# Patient Record
Sex: Female | Born: 1980 | Race: White | Hispanic: No | Smoking: Current some day smoker
Health system: Southern US, Community
[De-identification: ages and names within clinical notes are randomized; demographics above are authoritative.]

## PROBLEM LIST (undated history)

## (undated) DIAGNOSIS — M25569 Pain in unspecified knee: Secondary | ICD-10-CM

## (undated) DIAGNOSIS — F419 Anxiety disorder, unspecified: Secondary | ICD-10-CM

## (undated) DIAGNOSIS — F988 Other specified behavioral and emotional disorders with onset usually occurring in childhood and adolescence: Secondary | ICD-10-CM

## (undated) DIAGNOSIS — M79673 Pain in unspecified foot: Secondary | ICD-10-CM

## (undated) DIAGNOSIS — R519 Headache, unspecified: Secondary | ICD-10-CM

## (undated) DIAGNOSIS — F329 Major depressive disorder, single episode, unspecified: Secondary | ICD-10-CM

## (undated) DIAGNOSIS — K644 Residual hemorrhoidal skin tags: Secondary | ICD-10-CM

## (undated) DIAGNOSIS — K219 Gastro-esophageal reflux disease without esophagitis: Secondary | ICD-10-CM

## (undated) DIAGNOSIS — R600 Localized edema: Secondary | ICD-10-CM

## (undated) DIAGNOSIS — M2550 Pain in unspecified joint: Secondary | ICD-10-CM

## (undated) DIAGNOSIS — F32A Depression, unspecified: Secondary | ICD-10-CM

## (undated) DIAGNOSIS — G4730 Sleep apnea, unspecified: Secondary | ICD-10-CM

## (undated) DIAGNOSIS — Z8619 Personal history of other infectious and parasitic diseases: Secondary | ICD-10-CM

## (undated) HISTORY — DX: Depression, unspecified: F32.A

## (undated) HISTORY — DX: Major depressive disorder, single episode, unspecified: F32.9

## (undated) HISTORY — DX: Pain in unspecified knee: M25.569

## (undated) HISTORY — DX: Anxiety disorder, unspecified: F41.9

## (undated) HISTORY — DX: Residual hemorrhoidal skin tags: K64.4

## (undated) HISTORY — DX: Personal history of other infectious and parasitic diseases: Z86.19

## (undated) HISTORY — DX: Gastro-esophageal reflux disease without esophagitis: K21.9

## (undated) HISTORY — PX: MOLE REMOVAL: SHX2046

## (undated) HISTORY — DX: Pain in unspecified foot: M79.673

## (undated) HISTORY — DX: Pain in unspecified joint: M25.50

## (undated) HISTORY — DX: Localized edema: R60.0

## (undated) HISTORY — DX: Sleep apnea, unspecified: G47.30

## (undated) HISTORY — DX: Headache, unspecified: R51.9

---
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
## 2002-10-23 ENCOUNTER — Other Ambulatory Visit: Admission: RE | Admit: 2002-10-23 | Discharge: 2002-10-23 | Payer: Self-pay | Admitting: Obstetrics and Gynecology

## 2002-10-23 ENCOUNTER — Other Ambulatory Visit: Admission: RE | Admit: 2002-10-23 | Discharge: 2002-10-23 | Payer: Self-pay | Admitting: Obstetrics & Gynecology

## 2004-04-08 ENCOUNTER — Emergency Department (HOSPITAL_COMMUNITY): Admission: EM | Admit: 2004-04-08 | Discharge: 2004-04-08 | Payer: Self-pay | Admitting: Emergency Medicine

## 2004-08-20 ENCOUNTER — Ambulatory Visit: Payer: Self-pay | Admitting: Family Medicine

## 2004-08-27 ENCOUNTER — Ambulatory Visit: Payer: Self-pay

## 2004-09-08 ENCOUNTER — Ambulatory Visit: Payer: Self-pay | Admitting: *Deleted

## 2004-09-10 ENCOUNTER — Ambulatory Visit: Payer: Self-pay | Admitting: Family Medicine

## 2004-10-29 ENCOUNTER — Ambulatory Visit: Payer: Self-pay | Admitting: Family Medicine

## 2004-11-04 ENCOUNTER — Ambulatory Visit: Payer: Self-pay | Admitting: Family Medicine

## 2005-01-04 ENCOUNTER — Ambulatory Visit (HOSPITAL_COMMUNITY): Admission: RE | Admit: 2005-01-04 | Discharge: 2005-01-04 | Payer: Self-pay | Admitting: Surgery

## 2005-01-17 ENCOUNTER — Ambulatory Visit (HOSPITAL_BASED_OUTPATIENT_CLINIC_OR_DEPARTMENT_OTHER): Admission: RE | Admit: 2005-01-17 | Discharge: 2005-01-17 | Payer: Self-pay | Admitting: Surgery

## 2005-01-24 ENCOUNTER — Ambulatory Visit: Payer: Self-pay | Admitting: Internal Medicine

## 2005-02-01 ENCOUNTER — Ambulatory Visit (HOSPITAL_COMMUNITY): Admission: RE | Admit: 2005-02-01 | Discharge: 2005-02-01 | Payer: Self-pay | Admitting: Surgery

## 2005-02-01 ENCOUNTER — Encounter: Admission: RE | Admit: 2005-02-01 | Discharge: 2005-02-01 | Payer: Self-pay | Admitting: Surgery

## 2005-02-19 ENCOUNTER — Ambulatory Visit: Payer: Self-pay | Admitting: Internal Medicine

## 2005-03-12 ENCOUNTER — Encounter: Admission: RE | Admit: 2005-03-12 | Discharge: 2005-06-10 | Payer: Self-pay | Admitting: Surgery

## 2005-03-25 ENCOUNTER — Emergency Department (HOSPITAL_COMMUNITY): Admission: EM | Admit: 2005-03-25 | Discharge: 2005-03-25 | Payer: Self-pay | Admitting: Emergency Medicine

## 2005-07-29 ENCOUNTER — Encounter: Admission: RE | Admit: 2005-07-29 | Discharge: 2005-07-29 | Payer: Self-pay | Admitting: Surgery

## 2005-09-09 ENCOUNTER — Encounter: Admission: RE | Admit: 2005-09-09 | Discharge: 2005-09-09 | Payer: Self-pay | Admitting: Surgery

## 2005-09-27 ENCOUNTER — Encounter: Admission: RE | Admit: 2005-09-27 | Discharge: 2005-09-27 | Payer: Self-pay | Admitting: Occupational Medicine

## 2005-10-27 ENCOUNTER — Encounter: Admission: RE | Admit: 2005-10-27 | Discharge: 2006-01-25 | Payer: Self-pay | Admitting: Surgery

## 2005-11-09 ENCOUNTER — Ambulatory Visit (HOSPITAL_COMMUNITY): Admission: RE | Admit: 2005-11-09 | Discharge: 2005-11-10 | Payer: Self-pay | Admitting: Surgery

## 2005-11-09 HISTORY — PX: LAPAROSCOPIC GASTRIC BANDING: SHX1100

## 2006-01-17 ENCOUNTER — Encounter: Admission: RE | Admit: 2006-01-17 | Discharge: 2006-04-17 | Payer: Self-pay | Admitting: Surgery

## 2006-02-17 ENCOUNTER — Ambulatory Visit: Payer: Self-pay | Admitting: Family Medicine

## 2006-02-17 LAB — CONVERTED CEMR LAB
ALT: 14 units/L (ref 0–40)
Alkaline Phosphatase: 46 units/L (ref 39–117)
BUN: 8 mg/dL (ref 6–23)
Calcium: 8.9 mg/dL (ref 8.4–10.5)
Chloride: 108 meq/L (ref 96–112)
Chol/HDL Ratio, serum: 2.8
Cholesterol: 86 mg/dL (ref 0–200)
Creatinine, Ser: 0.9 mg/dL (ref 0.4–1.2)
Eosinophil percent: 1.3 % (ref 0.0–5.0)
GFR calc non Af Amer: 82 mL/min
Glomerular Filtration Rate, Af Am: 100 mL/min/{1.73_m2}
Glucose, Bld: 94 mg/dL (ref 70–99)
HCT: 41.1 % (ref 36.0–46.0)
Hemoglobin: 13.5 g/dL (ref 12.0–15.0)
Homocysteine: 8.7 micromoles/L (ref 5.00–13.90)
LDL Cholesterol: 40 mg/dL (ref 0–99)
MCHC: 32.8 g/dL (ref 30.0–36.0)
MCV: 89.1 fL (ref 78.0–100.0)
Sodium: 137 meq/L (ref 135–145)
Total Bilirubin: 0.5 mg/dL (ref 0.3–1.2)
VLDL: 15 mg/dL (ref 0–40)

## 2006-04-13 ENCOUNTER — Emergency Department (HOSPITAL_COMMUNITY): Admission: EM | Admit: 2006-04-13 | Discharge: 2006-04-13 | Payer: Self-pay | Admitting: Family Medicine

## 2006-05-11 ENCOUNTER — Emergency Department (HOSPITAL_COMMUNITY): Admission: EM | Admit: 2006-05-11 | Discharge: 2006-05-11 | Payer: Self-pay | Admitting: Family Medicine

## 2006-05-17 ENCOUNTER — Ambulatory Visit: Payer: Self-pay | Admitting: Family Medicine

## 2006-05-20 ENCOUNTER — Emergency Department (HOSPITAL_COMMUNITY): Admission: EM | Admit: 2006-05-20 | Discharge: 2006-05-20 | Payer: Self-pay | Admitting: Emergency Medicine

## 2006-06-03 ENCOUNTER — Ambulatory Visit: Payer: Self-pay | Admitting: Family Medicine

## 2006-11-14 ENCOUNTER — Ambulatory Visit: Payer: Self-pay | Admitting: Family Medicine

## 2006-11-14 DIAGNOSIS — F325 Major depressive disorder, single episode, in full remission: Secondary | ICD-10-CM | POA: Insufficient documentation

## 2007-06-06 ENCOUNTER — Ambulatory Visit: Payer: Self-pay | Admitting: Family Medicine

## 2007-06-06 DIAGNOSIS — K219 Gastro-esophageal reflux disease without esophagitis: Secondary | ICD-10-CM | POA: Insufficient documentation

## 2007-06-06 DIAGNOSIS — D2320 Other benign neoplasm of skin of unspecified ear and external auricular canal: Secondary | ICD-10-CM | POA: Insufficient documentation

## 2007-06-06 LAB — CONVERTED CEMR LAB
Bilirubin Urine: NEGATIVE
Glucose, Urine, Semiquant: NEGATIVE
Ketones, urine, test strip: NEGATIVE
Nitrite: NEGATIVE
Protein, U semiquant: 30
Urobilinogen, UA: NEGATIVE
pH: 5

## 2007-06-07 ENCOUNTER — Encounter: Payer: Self-pay | Admitting: Family Medicine

## 2007-06-08 ENCOUNTER — Encounter (INDEPENDENT_AMBULATORY_CARE_PROVIDER_SITE_OTHER): Payer: Self-pay | Admitting: *Deleted

## 2007-06-11 ENCOUNTER — Encounter (INDEPENDENT_AMBULATORY_CARE_PROVIDER_SITE_OTHER): Payer: Self-pay | Admitting: *Deleted

## 2007-06-11 LAB — CONVERTED CEMR LAB
ALT: 10 units/L (ref 0–35)
AST: 13 units/L (ref 0–37)
Albumin: 3.7 g/dL (ref 3.5–5.2)
Alkaline Phosphatase: 58 units/L (ref 39–117)
Basophils Relative: 0.1 % (ref 0.0–1.0)
Bilirubin, Direct: 0.1 mg/dL (ref 0.0–0.3)
CO2: 29 meq/L (ref 19–32)
Calcium: 9.1 mg/dL (ref 8.4–10.5)
Chloride: 107 meq/L (ref 96–112)
Cholesterol: 85 mg/dL (ref 0–200)
Creatinine, Ser: 0.8 mg/dL (ref 0.4–1.2)
Folate: 4.5 ng/mL
Glucose, Bld: 93 mg/dL (ref 70–99)
HDL: 38.7 mg/dL — ABNORMAL LOW (ref >39.0)
MCHC: 32.1 g/dL (ref 30.0–36.0)
Monocytes Absolute: 0.6 10*3/uL (ref 0.1–1.0)
Neutrophils Relative %: 72.1 % (ref 43.0–77.0)
Platelets: 384 10*3/uL (ref 150–400)
RBC: 4.79 M/uL (ref 3.87–5.11)
RDW: 12.4 % (ref 11.5–14.6)
TSH: 0.75 microintl units/mL (ref 0.35–5.50)
Triglycerides: 47 mg/dL (ref 0–149)
VLDL: 9 mg/dL (ref 0–40)
Vitamin B-12: 586 pg/mL (ref 211–911)
WBC: 8.8 10*3/uL (ref 4.5–10.5)

## 2007-06-12 ENCOUNTER — Encounter (INDEPENDENT_AMBULATORY_CARE_PROVIDER_SITE_OTHER): Payer: Self-pay | Admitting: *Deleted

## 2007-06-27 ENCOUNTER — Telehealth (INDEPENDENT_AMBULATORY_CARE_PROVIDER_SITE_OTHER): Payer: Self-pay | Admitting: *Deleted

## 2007-10-15 ENCOUNTER — Inpatient Hospital Stay (HOSPITAL_COMMUNITY): Admission: AD | Admit: 2007-10-15 | Discharge: 2007-10-15 | Payer: Self-pay | Admitting: Obstetrics

## 2007-10-15 ENCOUNTER — Encounter (INDEPENDENT_AMBULATORY_CARE_PROVIDER_SITE_OTHER): Payer: Self-pay | Admitting: Obstetrics

## 2007-12-27 ENCOUNTER — Encounter (INDEPENDENT_AMBULATORY_CARE_PROVIDER_SITE_OTHER): Payer: Self-pay | Admitting: *Deleted

## 2007-12-27 ENCOUNTER — Ambulatory Visit: Payer: Self-pay | Admitting: Family Medicine

## 2007-12-27 DIAGNOSIS — IMO0002 Reserved for concepts with insufficient information to code with codable children: Secondary | ICD-10-CM | POA: Insufficient documentation

## 2008-01-08 ENCOUNTER — Emergency Department (HOSPITAL_COMMUNITY): Admission: EM | Admit: 2008-01-08 | Discharge: 2008-01-08 | Payer: Self-pay | Admitting: Family Medicine

## 2008-01-09 ENCOUNTER — Ambulatory Visit: Payer: Self-pay | Admitting: Family Medicine

## 2008-01-09 ENCOUNTER — Telehealth (INDEPENDENT_AMBULATORY_CARE_PROVIDER_SITE_OTHER): Payer: Self-pay | Admitting: *Deleted

## 2008-01-09 DIAGNOSIS — G500 Trigeminal neuralgia: Secondary | ICD-10-CM | POA: Insufficient documentation

## 2008-01-12 ENCOUNTER — Telehealth (INDEPENDENT_AMBULATORY_CARE_PROVIDER_SITE_OTHER): Payer: Self-pay | Admitting: *Deleted

## 2008-01-12 ENCOUNTER — Ambulatory Visit: Payer: Self-pay | Admitting: Family Medicine

## 2008-01-12 LAB — CONVERTED CEMR LAB
ALT: 14 units/L (ref 0–35)
AST: 14 units/L (ref 0–37)
Albumin: 3.6 g/dL (ref 3.5–5.2)
Alkaline Phosphatase: 42 units/L (ref 39–117)
BUN: 10 mg/dL (ref 6–23)
Basophils Absolute: 0.2 10*3/uL — ABNORMAL HIGH (ref 0.0–0.1)
Calcium: 9.2 mg/dL (ref 8.4–10.5)
Creatinine, Ser: 1 mg/dL (ref 0.4–1.2)
Folate: 8 ng/mL
GFR calc Af Amer: 87 mL/min
Glucose, Bld: 80 mg/dL (ref 70–99)
HCT: 40.1 % (ref 36.0–46.0)
MCV: 91.2 fL (ref 78.0–100.0)
Monocytes Relative: 2.2 % — ABNORMAL LOW (ref 3.0–12.0)
Neutro Abs: 4.2 10*3/uL (ref 1.4–7.7)
Platelets: 288 10*3/uL (ref 150–400)
RDW: 11.8 % (ref 11.5–14.6)
Total Bilirubin: 0.6 mg/dL (ref 0.3–1.2)
Total Protein: 6.7 g/dL (ref 6.0–8.3)
WBC: 6.9 10*3/uL (ref 4.5–10.5)

## 2008-01-23 ENCOUNTER — Telehealth (INDEPENDENT_AMBULATORY_CARE_PROVIDER_SITE_OTHER): Payer: Self-pay | Admitting: *Deleted

## 2008-04-12 ENCOUNTER — Telehealth: Payer: Self-pay | Admitting: Family Medicine

## 2008-05-10 ENCOUNTER — Telehealth: Payer: Self-pay | Admitting: Family Medicine

## 2008-10-31 ENCOUNTER — Ambulatory Visit: Payer: Self-pay | Admitting: Family Medicine

## 2008-11-05 ENCOUNTER — Encounter (INDEPENDENT_AMBULATORY_CARE_PROVIDER_SITE_OTHER): Payer: Self-pay | Admitting: *Deleted

## 2008-11-05 LAB — CONVERTED CEMR LAB
AST: 15 units/L (ref 0–37)
Alkaline Phosphatase: 58 units/L (ref 39–117)
Bilirubin, Direct: 0.2 mg/dL (ref 0.0–0.3)
MCHC: 33.6 g/dL (ref 30.0–36.0)
MCV: 92.1 fL (ref 78.0–100.0)
Monocytes Absolute: 0.3 10*3/uL (ref 0.1–1.0)
Neutrophils Relative %: 66.2 % (ref 43.0–77.0)
RBC: 4.83 M/uL (ref 3.87–5.11)
Total Protein: 7.3 g/dL (ref 6.0–8.3)

## 2008-11-06 ENCOUNTER — Telehealth (INDEPENDENT_AMBULATORY_CARE_PROVIDER_SITE_OTHER): Payer: Self-pay | Admitting: *Deleted

## 2008-11-06 LAB — CONVERTED CEMR LAB: GC Probe Amp, Urine: NEGATIVE

## 2008-11-20 ENCOUNTER — Encounter: Payer: Self-pay | Admitting: Family Medicine

## 2008-12-13 ENCOUNTER — Telehealth (INDEPENDENT_AMBULATORY_CARE_PROVIDER_SITE_OTHER): Payer: Self-pay | Admitting: *Deleted

## 2008-12-18 ENCOUNTER — Encounter: Payer: Self-pay | Admitting: Family Medicine

## 2009-01-15 ENCOUNTER — Telehealth (INDEPENDENT_AMBULATORY_CARE_PROVIDER_SITE_OTHER): Payer: Self-pay | Admitting: *Deleted

## 2009-09-27 ENCOUNTER — Inpatient Hospital Stay (HOSPITAL_COMMUNITY): Admission: AD | Admit: 2009-09-27 | Discharge: 2009-09-27 | Payer: Self-pay | Admitting: Obstetrics and Gynecology

## 2009-09-30 ENCOUNTER — Observation Stay (HOSPITAL_COMMUNITY): Admission: AD | Admit: 2009-09-30 | Discharge: 2009-09-30 | Payer: Self-pay | Admitting: Obstetrics and Gynecology

## 2009-10-08 ENCOUNTER — Inpatient Hospital Stay (HOSPITAL_COMMUNITY): Admission: AD | Admit: 2009-10-08 | Discharge: 2009-10-11 | Payer: Self-pay | Admitting: Obstetrics & Gynecology

## 2009-10-31 ENCOUNTER — Ambulatory Visit: Payer: Self-pay | Admitting: Family Medicine

## 2009-10-31 DIAGNOSIS — M549 Dorsalgia, unspecified: Secondary | ICD-10-CM | POA: Insufficient documentation

## 2010-03-31 NOTE — Assessment & Plan Note (Signed)
Summary: pulled muscle in back   Vital Signs:  Patient profile:   30 year old female Height:      67.25 inches Weight:      231 pounds BMI:     36.04 Pulse rate:   82 / minute BP sitting:   110 / 74  (left arm)  Vitals Entered By: Doristine Devoid CMA (October 31, 2009 2:13 PM) CC: pulled muscle in back upper back to L shoulder    History of Present Illness: 30 yo woman here today for ? pulled muscle in L shoulder.  sxs started last night while cleaning.  now worse this AM.  pain is located below L shoulder blade, 'it kinda spasms'.  pain will radiate across back to R side.  no weakness or numbness of arm.  has oxycodone and hydrocodone left over from pregnancy- no relief.  unable to lift 47 week old infant due to pain.  is breastfeeding.  Allergies (verified): 1)  ! 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 Lungs are clear to auscultation, no crackles or wheezes. Heart:  normal rate, regular rhythm, and no murmur.   Msk:  + TTP over L scapula, full ROM of L shoulder, + muscle spasm. Pulses:  +2 carotid, radial, ulnar Extremities:  no C/C/E Neurologic:  strength normal in all extremities, sensation intact to light touch, and DTRs symmetrical and normal.     Impression & Recommendations:  Problem # 1:  BACK PAIN (ICD-724.5) Assessment New most likely due to muscle spasm.  start scheduled NSAIDs.  no studies on breast feeding and muscle relaxers- advised pt that if she wants to take muscle relaxer she needs to pump and dump for next 6-8 hrs.  initially wasn't willing to prescribe but pt desparate for relief.  pt expressed understanding of impact on breastfeeding.  reviewed supportive care and red flags that should prompt return.  Pt expresses understanding and  is in agreement w/ this plan. Her updated medication list for this problem includes:    Ibuprofen 800 Mg Tabs (Ibuprofen) .Marland Kitchen... 1 tab by mouth three times a day (every 8 hrs) as needed for pain.  take w/ food.    Cyclobenzaprine Hcl 10 Mg Tabs (Cyclobenzaprine hcl) .Marland Kitchen... 1 by mouth 2 times daily as needed for back pain  Complete Medication List: 1)  Bmp, Lipid  .... V70.0 2)  Ibuprofen 800 Mg Tabs (Ibuprofen) .Marland Kitchen.. 1 tab by mouth three times a day (every 8 hrs) as needed for pain.  take w/ food. 3)  Cyclobenzaprine Hcl 10 Mg Tabs (Cyclobenzaprine hcl) .Marland Kitchen.. 1 by mouth 2 times daily as needed for back pain  Patient Instructions: 1)  This is a muscle spasm/strain 2)  Take the ibuprofen regularly for the next 5-7 days and then as needed.  take w/ food 3)  Add tylenol as needed- don't take tylenol if you take the vicodin (it has tylenol in it) 4)  If you take the muscle relaxer- pump and dump for  ~ 8 hrs 5)  Try and avoid lifting the car seat- but it is safe to carry her as long as you feel safe doing it 6)  Use heat as often as possible- heating pad, Thermacare wrap 7)  CONGRATS!  Hang in there!!! Prescriptions: CYCLOBENZAPRINE HCL 10 MG  TABS (CYCLOBENZAPRINE HCL) 1 by mouth 2 times daily as needed for back pain  #20 x 0   Entered and Authorized by:   Neena Rhymes MD   Signed by:   Neena Rhymes MD on 10/31/2009   Method used:   Electronically to        [facility] [phone]* (retail)       [address] [place], [place]  [address]       Ph: [phone] or [phone]       Fax: [phone]   RxID:   [id number] IBUPROFEN 800 MG TABS (IBUPROFEN) 1 tab by mouth three times a day (every 8 hrs) as needed for pain.  take w/ food.  #60 x 0   Entered and Authorized by:   Neena Rhymes MD   Signed by:   Neena Rhymes MD on 10/31/2009   Method used:   Electronically to        [facility] [phone]* (retail)       [address] [place], [place]  [address]        Ph: [phone] or [phone]       Fax: [phone]   RxID:   [id number]

## 2010-05-15 LAB — CBC
HCT: 32.2 % — ABNORMAL LOW (ref 36.0–46.0)
Hemoglobin: 10.8 g/dL — ABNORMAL LOW (ref 12.0–15.0)
MCHC: 33.4 g/dL (ref 30.0–36.0)
Platelets: 217 10*3/uL (ref 150–400)
RDW: 13.8 % (ref 11.5–15.5)
WBC: 14.4 10*3/uL — ABNORMAL HIGH (ref 4.0–10.5)

## 2010-05-16 LAB — CBC
HCT: 37.6 % (ref 36.0–46.0)
RBC: 4.28 MIL/uL (ref 3.87–5.11)
RDW: 13.7 % (ref 11.5–15.5)

## 2010-05-16 LAB — URINALYSIS, ROUTINE W REFLEX MICROSCOPIC
Glucose, UA: NEGATIVE mg/dL
Nitrite: NEGATIVE
Urobilinogen, UA: 0.2 mg/dL (ref 0.0–1.0)
pH: 6 (ref 5.0–8.0)

## 2010-06-25 ENCOUNTER — Encounter: Payer: Self-pay | Admitting: Family Medicine

## 2010-07-03 ENCOUNTER — Encounter: Payer: Self-pay | Admitting: Family Medicine

## 2010-07-10 ENCOUNTER — Ambulatory Visit (INDEPENDENT_AMBULATORY_CARE_PROVIDER_SITE_OTHER): Payer: 59 | Admitting: Family Medicine

## 2010-07-10 ENCOUNTER — Encounter: Payer: Self-pay | Admitting: *Deleted

## 2010-07-10 ENCOUNTER — Encounter: Payer: Self-pay | Admitting: Family Medicine

## 2010-07-10 VITALS — BP 110/68 | HR 76 | Temp 98.9°F | Resp 20 | Ht 67.25 in | Wt 251.0 lb

## 2010-07-10 DIAGNOSIS — J069 Acute upper respiratory infection, unspecified: Secondary | ICD-10-CM

## 2010-07-10 DIAGNOSIS — Z0000 Encounter for general adult medical examination without abnormal findings: Secondary | ICD-10-CM

## 2010-07-10 LAB — BASIC METABOLIC PANEL
CO2: 24 mEq/L (ref 19–32)
Calcium: 9.3 mg/dL (ref 8.4–10.5)
Chloride: 108 mEq/L (ref 96–112)
GFR: 64.1 mL/min (ref >60.00)
Glucose, Bld: 79 mg/dL (ref 70–99)

## 2010-07-10 LAB — HEPATIC FUNCTION PANEL
ALT: 16 U/L (ref 0–35)
AST: 15 U/L (ref 0–37)
Albumin: 3.7 g/dL (ref 3.5–5.2)
Alkaline Phosphatase: 57 U/L (ref 39–117)
Bilirubin, Direct: 0.1 mg/dL (ref 0.0–0.3)
Total Protein: 6.9 g/dL (ref 6.0–8.3)

## 2010-07-10 LAB — POCT URINALYSIS DIPSTICK
Bilirubin, UA: NEGATIVE
Glucose, UA: NEGATIVE
Ketones, UA: NEGATIVE
Leukocytes, UA: NEGATIVE
Nitrite, UA: NEGATIVE
Protein, UA: NEGATIVE
Urobilinogen, UA: 0.2

## 2010-07-10 LAB — CBC WITH DIFFERENTIAL/PLATELET
Eosinophils Relative: 1.6 % (ref 0.0–5.0)
Hemoglobin: 14.5 g/dL (ref 12.0–15.0)
MCV: 90.1 fl (ref 78.0–100.0)
Neutrophils Relative %: 68 % (ref 43.0–77.0)
Platelets: 282 10*3/uL (ref 150.0–400.0)
RBC: 4.74 Mil/uL (ref 3.87–5.11)
RDW: 12.8 % (ref 11.5–14.6)
WBC: 6.9 10*3/uL (ref 4.5–10.5)

## 2010-07-10 LAB — TSH: TSH: 1.8 u[IU]/mL (ref 0.35–5.50)

## 2010-07-10 LAB — LIPID PANEL
HDL: 41.1 mg/dL (ref >39.00)
LDL Cholesterol: 48 mg/dL (ref 0–99)
VLDL: 17.8 mg/dL (ref 0.0–40.0)

## 2010-07-10 MED ORDER — FLUTICASONE PROPIONATE 50 MCG/ACT NA SUSP: 2.0000 | Freq: Every day | NASAL | Status: DC

## 2010-07-10 NOTE — Patient Instructions (Signed)
Upper Respiratory Infection (URI), Adult An upper respiratory infection (URI) is also known as the common cold. It is often caused by a virus. Colds are easily spread (contagious). You can pass it to others by touch or by drinking out of the same glass. You may have:  A runny nose.  Sneezing.   Coughing.   A stuffy nose (nasal congestion).  A sinus infection.   A sore throat.  A scratchy voice (hoarseness).   You can also have:  Tiredness (fatigue).  Muscle aches.   A headache.  A mild fever.   Usually, you get well in a week or two. Your doctor will know if you have a URI by talking to you and examining you. HOME CARE  Inhale heated mist or steam (vaporizer or shower).   Sip chicken soup.   Get plenty of rest.   Use lozenges for throat comfort.   Rinse your mouth (gargle) with warm water or salt water (1/4 teaspoon salt in 8 ounces of water).   Only take medicine as told by your doctor.   Drink enough water and fluids to keep your pee (urine) clear or pale yellow.   Rest as needed.   Return to work when your temperature has returned to normal or as told by your doctor. Use a face mask and wash your hands to stop your cold from spreading.  GET HELP IF:  You have a temperature by mouth above 100.4.   After the first few days, you feel you are getting worse, not better.   You have questions about your medicine.  GET HELP RIGHT AWAY IF:  You have a temperature by mouth above 100.4, not controlled by medicine.   You have a bad or lasting headache, ear pain, sinus pain, or chest pain.   You have trouble breathing or get short of breath.   You have a lasting cough, cough up blood, or have a change in your usual mucus.   You have sore muscles, a stiff neck, or a very bad headache, not controlled with medicine.  MAKE SURE YOU:  Understand these instructions.   Will watch your condition.   Will get help right away if you are not doing well or get worse.    Document Released: 08/04/2007 Document Re-Released: 05/12/2009 [facility] Patient Information 2011 [place], [place].

## 2010-07-10 NOTE — Progress Notes (Signed)
  Subjective:     Marisa Humphrey is a 30 y.o. female and is here for a comprehensive physical exam. The patient reports + sinus symptoms for 2 days.  Using otc sinus meds.    History   Social History  . Marital Status: Married    Spouse Name: N/A    Number of Children: 1  . Years of Education: N/A   Occupational History  . nurse [employer]   Social History Main Topics  . Smoking status: Current Everyday Smoker -- 0.3 packs/day    Types: Cigarettes  . Smokeless tobacco: Never Used  . Alcohol Use: Yes  . Drug Use: No  . Sexually Active: Yes -- Female partner(s)   Other Topics Concern  . Not on file   Social History Narrative  . No narrative on file   Health Maintenance  Topic Date Due  . Pap Smear  04/16/2000  . Tetanus/tdap  09/11/2012    The following portions of the patient's history were reviewed and updated as appropriate: allergies, current medications, past family history, past medical history, past social history, past surgical history and problem list.  Review of Systems Review of Systems  Constitutional: Negative for activity change, appetite change and fatigue.  HENT: Negative for hearing loss, congestion, tinnitus and ear discharge.  dentist q48m Eyes: Negative for visual disturbance   Respiratory: Negative for cough, chest tightness and shortness of breath.   Cardiovascular: Negative for chest pain, palpitations and leg swelling.  Gastrointestinal: Negative for abdominal pain, diarrhea, constipation and abdominal distention.  Genitourinary: Negative for urgency, frequency, decreased urine volume and difficulty urinating.  Musculoskeletal: Negative for back pain, arthralgias and gait problem.  Skin: Negative for color change, pallor and rash.  Neurological: Negative for dizziness, light-headedness, numbness and headaches.  Hematological: Negative for adenopathy. Does not bruise/bleed easily.  Psychiatric/Behavioral: Negative for suicidal ideas, confusion,  sleep disturbance, self-injury, dysphoric mood, decreased concentration and agitation.  Exercise--no  Objective:    BP 110/68  Pulse 76  Temp(Src) 98.9 F (37.2 C) (Oral)  Resp 20  Ht 5' 7.25" (1.708 m)  Wt 251 lb (113.853 kg)  BMI 39.02 kg/m2 General appearance: alert, cooperative, appears stated age, no distress and morbidly obese Head: Normocephalic, without obvious abnormality, atraumatic Eyes: conjunctivae/corneas clear. PERRL, EOM's intact. Fundi benign. Ears: normal TM's and external ear canals both ears Nose: Nares normal. Septum midline. Mucosa normal. No drainage or sinus tenderness., mild congestion, sinus tenderness bilateral Throat: lips, mucosa, and tongue normal; teeth and gums normal Neck: no adenopathy, supple, symmetrical, trachea midline and thyroid not enlarged, symmetric, no tenderness/mass/nodules Lungs: clear to auscultation bilaterally Breasts: normal appearance, no masses or tenderness Heart: regular rate and rhythm, S1, S2 normal, no murmur, click, rub or gallop Abdomen: soft, non-tender; bowel sounds normal; no masses,  no organomegaly Pelvic: gyn Extremities: extremities normal, atraumatic, no cyanosis or edema Pulses: 2+ and symmetric Skin: Skin color, texture, turgor normal. No rashes or lesions Lymph nodes: Cervical, supraclavicular, and axillary nodes normal. Neurologic: Grossly normal    Assessment:    Healthy female exam.  URI Plan:    diet and exercise flonase for congestion con't otc sinus med Call if symptoms do not improve See After Visit Summary for Counseling Recommendations

## 2010-07-17 ENCOUNTER — Encounter: Payer: Self-pay | Admitting: Family Medicine

## 2010-07-17 NOTE — Procedures (Signed)
[name], Marisa Humphrey               ACCOUNT NO.:  [id number]   MEDICAL RECORD NO.:  [id number]          PATIENT TYPE:  OUT   LOCATION:  SLEEP CENTER                 FACILITY[facility]  [facility]   PHYSICIAN:  Clinton D. Maple Hudson, M.D. DATE OF BIRTH:  1980/09/22   DATE OF STUDY:  01/17/2005                              NOCTURNAL POLYSOMNOGRAM   REFERRING PHYSICIAN:  Dr. Luretha Murphy   DATE OF STUDY:  January 17, 2005   INDICATION FOR STUDY:  Hypersomnia with sleep apnea. Epworth sleepiness  score 19/24, BMI 48, weight 311 pounds.   SLEEP ARCHITECTURE:  Total sleep time 438 minutes with sleep efficiency 85%.  Stage I was 5%, stage II 44%, stages III and IV 17%, REM 34% of total sleep  time. Sleep latency 21 minutes, REM latency 88 minutes, awake after sleep  onset 55 minutes, arousal index 18. No bedtime medication taken.   RESPIRATORY DATA:  Split study protocol. Apnea hypopnea index (AHI, RDI)  28.7 obstructive events per hour indicating moderate obstructive sleep  apnea/hypopnea syndrome before CPAP. This included 35 obstructive apneas and  34 hypopneas before CPAP. Events were not positional. REM AHI 12.9. CPAP was  titrated to 15 CWP, AHI 0.9 per hour. A full face mask was tried but not  tolerated because of claustrophobia. She used a Respironics ComfortGel nasal  mask, size petite, with heated humidifier. She required a Breathe Right  strip to help with nasal congestion.   OXYGEN DATA:  Moderate snoring with oxygen desaturation to a nadir of 87%  before CPAP. After CPAP control saturation held 97-98% on room air.   CARDIAC DATA:  Normal sinus rhythm.   MOVEMENT/PARASOMNIA:  Occasional leg jerk with little effect on sleep.   IMPRESSION/RECOMMENDATION:  1.  Moderate obstructive sleep apnea/hypopnea syndrome, apnea-hypopnea index      28.7 per hour with moderate to loud snoring and oxygen desaturation to      87%. Events were not positional.  2.  Successful continuous positive airway  pressure titration to 15 CWP,      apnea-hypopnea index 0.9 per hour. A      petite Respironics ComfortGel nasal mask was used with a heated      humidifier. A Breathe Right strip was required because of nasal      congestion.      Clinton D. Maple Hudson, M.D.  Diplomate, Biomedical engineer of Sleep Medicine  Electronically Signed     CDY/MEDQ  D:  01/24/2005 08:23:17  T:  01/24/2005 11:42:53  Job:  [id number]

## 2010-07-17 NOTE — Assessment & Plan Note (Signed)
[facility]                                 ON-CALL NOTE   CYLA, HALUSKA                        MRN:          [id number]  DATE:07/10/2006                            DOB:          21-Dec-1980    Patient of mine.  Calls from [phone].  She is in [place], getting ready  to leave on a cruise, and she thinks she is getting bronchitis.  She has  a cough and congestion and wants something called in so she has it in  case she gets worse on the cruise.  Z-pak was called in to a pharmacy in  [place], and I recommended that she get some Mucinex or Delsym to take  with her as well.  This phone call came in at 6:01 p.m. on Jul 10, 2006.     Lelon Perla, DO  Electronically Signed    Shawnie Dapper  DD: 07/10/2006  DT: 07/11/2006  Job #: [id number]

## 2010-07-17 NOTE — Op Note (Signed)
NAMEKILLIAN, RESS               ACCOUNT NO.:  [id number]   MEDICAL RECORD NO.:  [id number]          PATIENT TYPE:  OIB   LOCATION:  1513                         FACILITY:  [facility]   PHYSICIAN:  Thornton Park. Daphine Deutscher, MD  DATE OF BIRTH:  Jun 01, 1980   DATE OF PROCEDURE:  11/09/2005  DATE OF DISCHARGE:                                 OPERATIVE REPORT   PREOPERATIVE DIAGNOSIS:  Morbid obesity.   POSTOPERATIVE DIAGNOSIS:  Morbid obesity, status post lap band.   PROCEDURE:  Placement of laparoscopic adjustable gastric band (10 cm  Allergan).   SURGEON:  Thornton Park. Daphine Deutscher, MD   ASSISTANT:  Lorne Skeens. Hoxworth, M.D.   ANESTHESIA:  General endotracheal.   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  She was prepped  with chlorhexidine and draped sterilely.  The abdomen was entered using an  OptiVu technique through the left upper quadrant.  She was quite deep and  this took a little bit time, but we went patiently through her layers and  entered the abdomen and then insufflated.  Trocar placement was inhibited  somewhat by her size. We were able to put and ultimately a 15 on the right  lower where the port was placed and the rest being 11s or 12s.  There was  another with the right upper quadrant to the left of the, midline was the  port for the camera.  11 and a 5 were placed laterally.  The 5 mm upper  midline was used retract the liver.   Dissection began over on the left side where I incised the peritoneal  reflection of the stomach, the esophagogastric region along the left crus.  I then went over on the right crus down to the fat stripe and incised that  and passed the band passer.  She did have fairly dense tissue that I had  noticed not only putting the trocars in, but I needed to spread this a  little bit to go ahead and get the band passer across and I came through the  fatty tissue along  the left crus.  Once through I then inserted the band  using a 10 cm Allergan band through the 15 port and then passed it without  difficulty, inserted through the band passer and then brought it around.  Once around, I snapped the band.  We passed the calibration tubing.  I blew  up the balloon and pulled it back to the EG junction.  I used that to sort  of help mark where the band would be along the equator of that inflated  area.  The band was then sutured in place by first removing the tubing and  then plicating the stomach beginning over on the greater curvature up onto  the small pouch just below the EG junction.  Three sutures were placed and  controlled with tie knots.   Once this was completed the tail of band was brought out through the 15 port  and then  the trocars and liver retractor were removed.  We then after  deflating the abdomen, cut down the 15 mm port and then cleared an area for  the port itself and put four sutures of Prolene in, anchoring it to the  fascia.  The band tubing was then connected to the port.  These were  threaded through the port and tied down to the fascia.  It seated nicely.  I  irrigated.  I closed all wounds  of 4-0 Vicryl subcutaneously and subcuticularly with Benzoin and Steri-  Strips.  I also injected all the port sites with half percent Marcaine.  The  patient seemed tolerate the procedure well and was taken to recovery room in  satisfactory condition.      Thornton Park Daphine Deutscher, MD  Electronically Signed     MBM/MEDQ  D:  11/09/2005  T:  11/10/2005  Job:  [id number]   cc:   Loreen Freud, M.D.  [phone]. [address] [place]  [place] [address]
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
## 2010-11-23 ENCOUNTER — Ambulatory Visit
Admission: RE | Admit: 2010-11-23 | Discharge: 2010-11-23 | Disposition: A | Discharge status: Home / Self Care | Payer: 59 | Source: Ambulatory Visit | Attending: Family Medicine | Admitting: Family Medicine

## 2010-11-23 ENCOUNTER — Other Ambulatory Visit: Payer: Self-pay | Admitting: Family Medicine

## 2010-11-23 ENCOUNTER — Inpatient Hospital Stay (INDEPENDENT_AMBULATORY_CARE_PROVIDER_SITE_OTHER)
Admission: RE | Admit: 2010-11-23 | Discharge: 2010-11-23 | Disposition: A | Discharge status: Home / Self Care | Payer: 59 | Source: Ambulatory Visit | Attending: Family Medicine | Admitting: Family Medicine

## 2010-11-23 ENCOUNTER — Encounter: Payer: Self-pay | Admitting: Family Medicine

## 2010-11-23 DIAGNOSIS — S99929A Unspecified injury of unspecified foot, initial encounter: Secondary | ICD-10-CM

## 2010-11-23 DIAGNOSIS — S93609A Unspecified sprain of unspecified foot, initial encounter: Secondary | ICD-10-CM

## 2010-11-23 DIAGNOSIS — M79609 Pain in unspecified limb: Secondary | ICD-10-CM

## 2010-11-23 DIAGNOSIS — S93409A Sprain of unspecified ligament of unspecified ankle, initial encounter: Secondary | ICD-10-CM

## 2010-11-23 DIAGNOSIS — S8990XA Unspecified injury of unspecified lower leg, initial encounter: Secondary | ICD-10-CM

## 2010-11-23 DIAGNOSIS — J209 Acute bronchitis, unspecified: Secondary | ICD-10-CM

## 2010-11-25 ENCOUNTER — Telehealth (INDEPENDENT_AMBULATORY_CARE_PROVIDER_SITE_OTHER): Payer: Self-pay | Admitting: Emergency Medicine

## 2011-02-01 NOTE — Progress Notes (Signed)
Summary: LT FOOT/ ANKLE INJ...WSE Room 4   Vital Signs:  Patient Profile:   30 Years Old Female CC:      Left foot/ankle injury from fall off curb this morning/ Cough x 2 wks Height:     67.25 inches Weight:      253 pounds O2 Sat:      98 % O2 treatment:    Room Air Temp:     98.7 degrees F oral Pulse rate:   77 / minute Pulse rhythm:   regular Resp:     18 per minute BP sitting:   111 / 74  (left arm) Cuff size:   large  Vitals Entered By: Emilio Math (November 23, 2010 3:33 PM)                  Current Allergies: ! PCNHistory of Present Illness Chief Complaint: Left foot/ankle injury from fall off curb this morning/ Cough x 2 wks History of Present Illness:  Subjective:  Patient presents with two complaints: 1)  This morning she tripped and twisted her left foot and ankle.  She now has swellling and pain with weight bearing. 2)  About 2.5 weeks ago she developed a cough and tightness in her anterior chest.  The cough has persisted and become worse at night.  It has also become non-productive.  She has had no sore throat, nasal congestion, pleuritic pain, wheezing, or shortness of breath.  No fevers, chills, and sweats.  No seasonal allergies or asthma.  She quit smoking about a month ago.  Current Meds IBUPROFEN 800 MG TABS (IBUPROFEN) 1 tab by mouth three times a day (every 8 hrs) as needed for pain.  take w/ food. NUVARING 0.12-0.015 MG/24HR RING (ETONOGESTREL-ETHINYL ESTRADIOL)  CLARITHROMYCIN 500 MG TABS (CLARITHROMYCIN) One Tab by mouth two times a day BENZONATATE 200 MG CAPS (BENZONATATE) One by mouth hs as needed cough LORTAB 5 5-500 MG TABS (HYDROCODONE-ACETAMINOPHEN) One or two tabs by mouth hs as needed pain  REVIEW OF SYSTEMS Constitutional Symptoms      Denies fever, chills, night sweats, weight loss, weight gain, and fatigue.  Eyes       Denies change in vision, eye pain, eye discharge, glasses, contact lenses, and eye surgery. Ear/Nose/Throat/Mouth      Denies hearing loss/aids, change in hearing, ear pain, ear discharge, dizziness, frequent runny nose, frequent nose bleeds, sinus problems, sore throat, hoarseness, and tooth pain or bleeding.  Respiratory       Complains of dry cough.      Denies productive cough, wheezing, shortness of breath, asthma, bronchitis, and emphysema/COPD.  Cardiovascular       Denies murmurs, chest pain, and tires easily with exhertion.    Gastrointestinal       Denies stomach pain, nausea/vomiting, diarrhea, constipation, blood in bowel movements, and indigestion. Genitourniary       Denies painful urination, kidney stones, and loss of urinary control. Neurological       Denies paralysis, seizures, and fainting/blackouts. Musculoskeletal       Complains of muscle pain, joint pain, joint stiffness, and decreased range of motion.      Denies redness, swelling, muscle weakness, and gout.  Skin       Denies bruising, unusual mles/lumps or sores, and hair/skin or nail changes.  Psych       Denies mood changes, temper/anger issues, anxiety/stress, speech problems, depression, and sleep problems.  Past History:  Past Medical History: Reviewed history from 06/06/2007 and no changes  required. Depression GERD  Past Surgical History: Reviewed history from 06/06/2007 and no changes required. Lap Band surgery-Martin 11/09/2005  Family History: Reviewed history from 06/06/2007 and no changes required. 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  Extraocular movement is intact.  Conjunctivae are not inflamed.  Ears:  Canals normal.  Tympanic membranes normal.   Nose:  Mildly congested turbinates.  No sinus tenderness  Pharynx:  Normal    Neck:  Supple.  No adenopathy is present.   Lungs:  Clear to auscultation.  Breath sounds are equal.  Chest:  No sternum tenderness Heart:  Regular rate and rhythm without murmurs, rubs, or gallops.  Abdomen:  Nontender without masses or hepatosplenomegaly.  Bowel sounds are present.  No CVA or flank tenderness.  Extremities:  No edema.   Objective Left ankle:  Mildly decreased range of motion.  Tenderness and mild swelling over the lateral malleolus.  Joint stable.  No tenderness over the base of the fifth  metatarsal.  Distal neurovascular intact.  Left foot:  Mild tenderness dorsally over proximal 3rd and 4th metatarsals. X-ray left ankle:  IMPRESSION: Normal exam. X-ray left foot:  IMPRESSION: No acute bony findings. Assessment New Problems: ANKLE SPRAIN, LEFT (ICD-845.00) FOOT SPRAIN, LEFT (ICD-845.10) ANKLE INJURY, LEFT (ICD-959.7) ACUTE BRONCHITIS (ICD-466.0) FOOT PAIN, LEFT (ICD-729.5)   Plan New Medications/Changes: LORTAB 5 5-500 MG TABS (HYDROCODONE-ACETAMINOPHEN) One or two tabs by mouth hs as needed pain  #10 (ten) x 0, 11/23/2010, Donna Christen MD BENZONATATE 200 MG CAPS (BENZONATATE) One by mouth hs as needed cough  #12 x 0, 11/23/2010, Donna Christen MD CLARITHROMYCIN 500 MG TABS (CLARITHROMYCIN) One Tab by mouth two times a day  #20 x 0, 11/23/2010, Donna Christen MD  New Orders: T-DG Foot Complete*L* [[address]] T-DG Ankle Complete*L* [73610] Ace Wraps 3-5 in/yard  [A6449] Crutches [E0110] Crutches fitting and training [97760] Aircast Ankle Brace [L4350] Pulse Oximetry (single measurment) [94760] Est. Patient Level V [60454] Planning Comments:   Begin Biaxin, expectorant, cough suppressant at bedtime.  Increase fluid intake Followup with PCP if not improving 7 to 10 days  Begin ibuprofen.  Analgesic at bedtime for pain. Applied ace wrap to left foot/ankle.  Begin crutches for 5 to 7 days.  Begin applying ice pack several times daily.  When ambulatory, begin  AirCast ankle splint.  Begin exercises in about 5 days ([facility] information and instruction patient handout given)  Followup with [facility] if not improved in two weeks.  Work/School Excuse: Return to work/school today  The patient and/or caregiver has been counseled thoroughly with regard to medications prescribed including dosage, schedule, interactions, rationale for use, and possible side effects and they verbalize understanding.  Diagnoses and expected course of recovery discussed and will return if not improved as expected or if the condition worsens. Patient and/or caregiver verbalized understanding.  Prescriptions: LORTAB 5 5-500 MG TABS (HYDROCODONE-ACETAMINOPHEN) One or two tabs by mouth hs as needed pain  #10 (ten) x 0   Entered and Authorized by:   Donna Christen MD   Signed by:   Donna Christen MD on 11/23/2010   Method used:   Print then Give to Patient   RxID:   [id number] BENZONATATE 200 MG CAPS (BENZONATATE) One by mouth  hs as needed cough  #12 x 0   Entered and Authorized by:   Donna Christen MD   Signed by:   Donna Christen MD on 11/23/2010   Method used:   Print then Give to Patient   RxID:   [id number] CLARITHROMYCIN 500 MG TABS (CLARITHROMYCIN) One Tab by mouth two times a day  #20 x 0   Entered and Authorized by:   Donna Christen MD   Signed by:   Donna Christen MD on 11/23/2010   Method used:   Print then Give to Patient   RxID:   [id number]   Patient Instructions: 1)  Take Mucinex (guaifenesin) twice daily for congestion. 2)  Increase fluid intake, rest.   3)  Take Ibuprofen 200mg , 4 tabs every 8 hours with food for 2 to 4 days for ankle and foot pain. 4)  Followup with family doctor if not improving 7 to 10 days.   Orders Added: 1)  T-DG Foot Complete*L* [73630] 2)  T-DG Ankle Complete*L* [73610] 3)  Ace Wraps 3-5 in/yard  [A6449] 4)  Crutches [E0110] 5)  Crutches fitting and training [97760] 6)  Aircast Ankle Brace  [L4350] 7)  Pulse Oximetry (single measurment) [94760] 8)  Est. Patient Level V [84696]

## 2011-02-01 NOTE — Letter (Signed)
Summary: Out of Work  [facility]  [address]   [place], [place] [address]   Phone: [phone]  Fax: [phone]    November 23, 2010   Employee:  Marisa Humphrey    To Whom It May Concern:   For Medical reasons, please excuse the above named employee from work today.  She should use crutches for one week and wear a left ankle brace.   If you need additional information, please feel free to contact our office.         Sincerely,    Donna Christen MD

## 2011-02-01 NOTE — Telephone Encounter (Signed)
  Phone Note Outgoing Call   Call placed by: Lavell Islam RN,  November 25, 2010 3:04 PM Call placed to: Patient Action Taken: Phone Call Completed Summary of Call: Left message on voice mail inquiring about left foot/ankle and cough; encouraged patient to call with questions/concerns. Initial call taken by: Lavell Islam RN,  November 25, 2010 3:04 PM

## 2011-02-15 ENCOUNTER — Emergency Department (HOSPITAL_COMMUNITY)
Admission: EM | Admit: 2011-02-15 | Discharge: 2011-02-15 | Disposition: A | Discharge status: Home / Self Care | Payer: 59 | Source: Home / Self Care | Attending: Family Medicine | Admitting: Family Medicine

## 2011-02-15 ENCOUNTER — Encounter (HOSPITAL_COMMUNITY): Payer: Self-pay

## 2011-02-15 DIAGNOSIS — J029 Acute pharyngitis, unspecified: Secondary | ICD-10-CM

## 2011-02-15 MED ORDER — AMOXICILLIN 500 MG PO CAPS: 500.0000 mg | ORAL_CAPSULE | Freq: Three times a day (TID) | ORAL | Status: AC

## 2011-02-15 NOTE — ED Provider Notes (Signed)
History     CSN: [id number] Arrival date & time: 02/15/2011  8:19 AM   First MD Initiated Contact with Patient 02/15/11 [phone]      Chief Complaint  Patient presents with  . Sore Throat    1 day hx of sore throat, fever and runny nose.  Worked last night and symptoms got worse.      (Consider location/radiation/quality/duration/timing/severity/associated sxs/prior treatment) HPI Comments: Marisa Humphrey presents for evaluation of fever, sore throat, runny nose, minimal cough since yesterday. She attended a Christmas party last night with multiple sick contacts. Her 69 month-old daughter has been sick recently as well.   Patient is a 30 y.o. female presenting with pharyngitis. The history is provided by the patient.  Sore Throat This is a new problem. The current episode started yesterday. The problem occurs constantly. The problem has not changed since onset.Pertinent negatives include no shortness of breath. The symptoms are aggravated by swallowing. The symptoms are relieved by nothing. The treatment provided no relief.    Past Medical History  Diagnosis Date  . Depression   . GERD (gastroesophageal reflux disease)     Past Surgical History  Procedure Date  . Laparoscopic gastric banding 11/09/2005    Dr.Martin    Family History  Problem Relation Age of Onset  . Heart attack Father 47  . Heart disease Father 9    MI  . Diabetes Mother     History  Substance Use Topics  . Smoking status: Current Everyday Smoker -- 0.3 packs/day    Types: Cigarettes  . Smokeless tobacco: Never Used  . Alcohol Use: Yes    OB History    Grav Para Term Preterm Abortions TAB SAB Ect Mult Living                  Review of Systems  Constitutional: Positive for fever and chills.  HENT: Positive for congestion, sore throat, rhinorrhea and sneezing. Negative for ear pain and trouble swallowing.   Eyes: Negative.   Respiratory: Negative for cough, shortness of breath and wheezing.     Cardiovascular: Negative.   Gastrointestinal: Negative.   Genitourinary: Negative.   Musculoskeletal: Negative.   Skin: Negative.     Allergies  Penicillins  Home Medications   Current Outpatient Rx  Name Route Sig Dispense Refill  . ETONOGESTREL-ETHINYL ESTRADIOL 0.12-0.015 MG/24HR VA RING Vaginal Place 1 each vaginally every 28 (twenty-eight) days. Insert vaginally and leave in place for 3 consecutive weeks, then remove for 1 week.     Marland Kitchen FLUTICASONE PROPIONATE 50 MCG/ACT NA SUSP Nasal 2 sprays by Nasal route daily. 16 g 2  . AMOXICILLIN 500 MG PO CAPS Oral Take 1 capsule (500 mg total) by mouth 3 (three) times daily. 30 capsule 0  . CYCLOBENZAPRINE HCL 10 MG PO TABS Oral Take 10 mg by mouth 2 (two) times daily as needed.      . IBUPROFEN 800 MG PO TABS Oral Take 800 mg by mouth every 8 (eight) hours as needed.        BP 121/84  Pulse 111  Temp(Src) 101.6 F (38.7 C) (Oral)  Resp 18  SpO2 97%  LMP 01/16/2011  Physical Exam  Constitutional: She is oriented to person, place, and time. She appears well-developed and well-nourished.  HENT:  Head: Normocephalic and atraumatic.  Right Ear: Tympanic membrane and external ear normal.  Left Ear: Tympanic membrane and external ear normal.  Nose: Nose normal.  Mouth/Throat: Uvula is midline and mucous membranes are  normal. Oropharyngeal exudate present.    Eyes: EOM are normal. Pupils are equal, round, and reactive to light.  Neck: Normal range of motion.  Cardiovascular: Normal rate and regular rhythm.   Pulmonary/Chest: Effort normal and breath sounds normal.  Neurological: She is alert and oriented to person, place, and time.  Skin: Skin is warm and dry.    ED Course  Procedures (including critical care time)   Labs Reviewed  POCT RAPID STREP A ([facility] ONLY)   No results found.   1. Pharyngitis       MDM  Rapid strep negative; 4/4 Cent[facility] criteria; will treat empirically        Richardo Priest,  MD 02/15/11 [phone]

## 2011-02-15 NOTE — ED Notes (Signed)
1 day hx of sore throat, fever and runny nose.  Worked last night and symptoms got worse.  Today has fever and headache with generalized body aches.

## 2011-06-05 ENCOUNTER — Emergency Department
Admission: EM | Admit: 2011-06-05 | Discharge: 2011-06-05 | Disposition: A | Discharge status: Home / Self Care | Payer: 59 | Source: Home / Self Care | Attending: Family Medicine | Admitting: Family Medicine

## 2011-06-05 ENCOUNTER — Encounter: Payer: Self-pay | Admitting: *Deleted

## 2011-06-05 DIAGNOSIS — J069 Acute upper respiratory infection, unspecified: Secondary | ICD-10-CM

## 2011-06-05 MED ORDER — GUAIFENESIN-CODEINE 100-10 MG/5ML PO SYRP: 10.0000 mL | ORAL_SOLUTION | Freq: Every day | ORAL | Status: AC

## 2011-06-05 MED ORDER — AZITHROMYCIN 250 MG PO TABS: ORAL_TABLET | ORAL | Status: AC

## 2011-06-05 NOTE — ED Notes (Signed)
PT c/c is productive cough with green mucous associated with a sore throat, sinus headache x 6 days.  Pt had a fever at onset of sx but denies having one since.

## 2011-06-05 NOTE — ED Provider Notes (Signed)
History     CSN: [id number]  Arrival date & time 06/05/11  8295   First MD Initiated Contact with Patient 06/05/11 [phone]      Chief Complaint  Patient presents with  . Cough  . Sore Throat      HPI Comments: Patient complains of approximately 6 day history of gradually progressive URI symptoms beginning with a mild sore throat (now improved), followed by progressive nasal congestion.  A cough started about 3 days ago.  Complains of fatigue and initial myalgias.  Cough is now worse at night and generally non-productive during the day.  There has been no pleuritic pain, shortness of breath, or wheezes.  She had fever initially, now resolved.  The history is provided by the patient.    Past Medical History  Diagnosis Date  . Depression   . GERD (gastroesophageal reflux disease)     Past Surgical History  Procedure Date  . Laparoscopic gastric banding 11/09/2005    Dr.Martin    Family History  Problem Relation Age of Onset  . Heart attack Father 20  . Heart disease Father 23    MI  . Diabetes Mother     History  Substance Use Topics  . Smoking status: Former Smoker    Types: Cigarettes  . Smokeless tobacco: Never Used  . Alcohol Use: No    OB History    Grav Para Term Preterm Abortions TAB SAB Ect Mult Living                  Review of Systems + sore throat + cough No pleuritic pain No wheezing + nasal congestion + post-nasal drainage No sinus pain/pressure No itchy/red eyes No earache No hemoptysis No SOB + fever, + chills No nausea No vomiting No abdominal pain No diarrhea No urinary symptoms No skin rashes + fatigue + myalgias + headache  Tessalon has not helped her cough Allergies  Review of patient's allergies indicates no known allergies.  Home Medications   Current Outpatient Rx  Name Route Sig Dispense Refill  . AZITHROMYCIN 250 MG PO TABS  Take 2 tabs today; then begin one tab once daily for 4 more days (Rx void after 06/13/11) 6  each 0  . CYCLOBENZAPRINE HCL 10 MG PO TABS Oral Take 10 mg by mouth 2 (two) times daily as needed.      . ETONOGESTREL-ETHINYL ESTRADIOL 0.12-0.015 MG/24HR VA RING Vaginal Place 1 each vaginally every 28 (twenty-eight) days. Insert vaginally and leave in place for 3 consecutive weeks, then remove for 1 week.     Marland Kitchen FLUTICASONE PROPIONATE 50 MCG/ACT NA SUSP Nasal 2 sprays by Nasal route daily. 16 g 2  . GUAIFENESIN-CODEINE 100-10 MG/5ML PO SYRP Oral Take 10 mLs by mouth at bedtime. for cough 120 mL 0  . IBUPROFEN 800 MG PO TABS Oral Take 800 mg by mouth every 8 (eight) hours as needed.        BP 111/77  Pulse 90  Temp(Src) 98.6 F (37 C) (Oral)  Resp 16  Ht 5\' 7"  (1.702 m)  Wt 258 lb 12 oz (117.368 kg)  BMI 40.53 kg/m2  SpO2 98%  Physical Exam Nursing notes and Vital Signs reviewed. Appearance:  Patient appears healthy, stated age, and in no acute distress Eyes:  Pupils are equal, round, and reactive to light and accomodation.  Extraocular movement is intact.  Conjunctivae are not inflamed  Ears:  Canals normal.  Tympanic membranes normal.  Nose:   Moderately congested  turbinates.  No sinus tenderness.  Pharynx:  Normal Neck:  Supple.  Slightly tender shotty anterior/posterior nodes are palpated bilaterally  Lungs:  Clear to auscultation.  Breath sounds are equal.  Heart:  Regular rate and rhythm without murmurs, rubs, or gallops.  Abdomen:  Nontender without masses or hepatosplenomegaly.  Bowel sounds are present.  No CVA or flank tenderness.  Extremities:  No edema.  No calf tenderness Skin:  No rash present.   ED Course  Procedures  none   Labs Reviewed  POCT RAPID STREP A (OFFICE) negative      1. Acute upper respiratory infections of unspecified site       MDM   There is no evidence of bacterial infection today.   Treat symptomatically for now: Rx given for Robitussin AC at bedtime. Take Mucinex D (guaifenesin with decongestant) twice daily for congestion.   Increase fluid intake, rest. May use Afrin nasal spray (or generic oxymetazoline) twice daily for about 5 days.  Also recommend using saline nasal spray several times daily and saline nasal irrigation (AYR is a common brand) Stop all antihistamines for now, and other non-prescription cough/cold preparations. Begin Azithromycin if not improving about 5 days or if persistent fever develops (Given a prescription to hold, with an expiration date)  Follow-up with family doctor if not improving 7 to 10 days.         Lattie Haw, MD 06/05/11 [phone]

## 2011-06-05 NOTE — Discharge Instructions (Signed)
Take Mucinex D (guaifenesin with decongestant) twice daily for congestion.  Increase fluid intake, rest. °May use Afrin nasal spray (or generic oxymetazoline) twice daily for about 5 days.  Also recommend using saline nasal spray several times daily and saline nasal irrigation (AYR is a common brand) °Stop all antihistamines for now, and other non-prescription cough/cold preparations. °Begin Azithromycin if not improving about 5 days or if persistent fever develops. °Follow-up with family doctor if not improving 7 to 10 days.  °

## 2011-06-07 ENCOUNTER — Telehealth: Payer: Self-pay | Admitting: Family Medicine

## 2011-07-05 ENCOUNTER — Telehealth: Payer: Self-pay | Admitting: Family Medicine

## 2011-07-05 NOTE — Telephone Encounter (Signed)
lmovm for patient to return call.

## 2011-07-05 NOTE — Telephone Encounter (Signed)
She needs an apt.     KP 

## 2011-07-05 NOTE — Telephone Encounter (Signed)
Patient called back and will wait til 6.18.13 for her CPE

## 2011-07-05 NOTE — Telephone Encounter (Signed)
Patient called stating she has taken Nuvigil that her sister gave her and would like to get rx. She states she works weekend nights and has been taking it on Sundays only.

## 2011-08-17 ENCOUNTER — Encounter: Payer: Self-pay | Admitting: Family Medicine

## 2011-08-17 ENCOUNTER — Ambulatory Visit (INDEPENDENT_AMBULATORY_CARE_PROVIDER_SITE_OTHER): Payer: 59 | Admitting: Family Medicine

## 2011-08-17 VITALS — BP 98/70 | HR 72 | Temp 98.9°F | Wt 249.0 lb

## 2011-08-17 DIAGNOSIS — Z0000 Encounter for general adult medical examination without abnormal findings: Secondary | ICD-10-CM

## 2011-08-17 DIAGNOSIS — G4726 Circadian rhythm sleep disorder, shift work type: Secondary | ICD-10-CM

## 2011-08-17 DIAGNOSIS — E669 Obesity, unspecified: Secondary | ICD-10-CM

## 2011-08-17 LAB — HEPATIC FUNCTION PANEL
ALT: 13 U/L (ref 0–35)
AST: 12 U/L (ref 0–37)
Albumin: 3.5 g/dL (ref 3.5–5.2)
Alkaline Phosphatase: 58 U/L (ref 39–117)
Total Protein: 6.8 g/dL (ref 6.0–8.3)

## 2011-08-17 LAB — CBC WITH DIFFERENTIAL/PLATELET
Basophils Relative: 0.3 % (ref 0.0–3.0)
Eosinophils Relative: 1.4 % (ref 0.0–5.0)
HCT: 42.1 % (ref 36.0–46.0)
Hemoglobin: 14 g/dL (ref 12.0–15.0)
Lymphocytes Relative: 25.6 % (ref 12.0–46.0)
Lymphs Abs: 2.2 10*3/uL (ref 0.7–4.0)
Monocytes Relative: 4.3 % (ref 3.0–12.0)
Neutro Abs: 5.8 10*3/uL (ref 1.4–7.7)
RBC: 4.81 Mil/uL (ref 3.87–5.11)
RDW: 13.5 % (ref 11.5–14.6)

## 2011-08-17 LAB — LIPID PANEL
Cholesterol: 110 mg/dL (ref 0–200)
VLDL: 15.2 mg/dL (ref 0.0–40.0)

## 2011-08-17 LAB — POCT URINALYSIS DIPSTICK
Glucose, UA: NEGATIVE
Ketones, UA: NEGATIVE
Leukocytes, UA: NEGATIVE
Protein, UA: NEGATIVE
Spec Grav, UA: >=1.03

## 2011-08-17 LAB — BASIC METABOLIC PANEL
BUN: 8 mg/dL (ref 6–23)
GFR: 68.72 mL/min (ref >60.00)
Potassium: 3.7 mEq/L (ref 3.5–5.1)

## 2011-08-17 LAB — TSH: TSH: 0.9 u[IU]/mL (ref 0.35–5.50)

## 2011-08-17 MED ORDER — MODAFINIL 100 MG PO TABS: 100.0000 mg | ORAL_TABLET | Freq: Every day | ORAL | Status: DC

## 2011-08-17 NOTE — Patient Instructions (Addendum)
Preventive Care for Adults, Female A healthy lifestyle and preventive care can promote health and wellness. Preventive health guidelines for women include the following key practices.  A routine yearly physical is a good way to check with your caregiver about your health and preventive screening. It is a chance to share any concerns and updates on your health, and to receive a thorough exam.   Visit your dentist for a routine exam and preventive care every 6 months. Brush your teeth twice a day and floss once a day. Good oral hygiene prevents tooth decay and gum disease.   The frequency of eye exams is based on your age, health, family medical history, use of contact lenses, and other factors. Follow your caregiver's recommendations for frequency of eye exams.   Eat a healthy diet. Foods like vegetables, fruits, whole grains, low-fat dairy products, and lean protein foods contain the nutrients you need without too many calories. Decrease your intake of foods high in solid fats, added sugars, and salt. Eat the right amount of calories for you.Get information about a proper diet from your caregiver, if necessary.   Regular physical exercise is one of the most important things you can do for your health. Most adults should get at least 150 minutes of moderate-intensity exercise (any activity that increases your heart rate and causes you to sweat) each week. In addition, most adults need muscle-strengthening exercises on 2 or more days a week.   Maintain a healthy weight. The body mass index (BMI) is a screening tool to identify possible weight problems. It provides an estimate of body fat based on height and weight. Your caregiver can help determine your BMI, and can help you achieve or maintain a healthy weight.For adults 20 years and older:   A BMI below 18.5 is considered underweight.   A BMI of 18.5 to 24.9 is normal.   A BMI of 25 to 29.9 is considered overweight.   A BMI of 30 and above is  considered obese.   Maintain normal blood lipids and cholesterol levels by exercising and minimizing your intake of saturated fat. Eat a balanced diet with plenty of fruit and vegetables. Blood tests for lipids and cholesterol should begin at age 20 and be repeated every 5 years. If your lipid or cholesterol levels are high, you are over 50, or you are at high risk for heart disease, you may need your cholesterol levels checked more frequently.Ongoing high lipid and cholesterol levels should be treated with medicines if diet and exercise are not effective.   If you smoke, find out from your caregiver how to quit. If you do not use tobacco, do not start.   If you are pregnant, do not drink alcohol. If you are breastfeeding, be very cautious about drinking alcohol. If you are not pregnant and choose to drink alcohol, do not exceed 1 drink per day. One drink is considered to be 12 ounces (355 mL) of beer, 5 ounces (148 mL) of wine, or 1.5 ounces (44 mL) of liquor.   Avoid use of street drugs. Do not share needles with anyone. Ask for help if you need support or instructions about stopping the use of drugs.   High blood pressure causes heart disease and increases the risk of stroke. Your blood pressure should be checked at least every 1 to 2 years. Ongoing high blood pressure should be treated with medicines if weight loss and exercise are not effective.   If you are 55 to 29   years old, ask your caregiver if you should take aspirin to prevent strokes.   Diabetes screening involves taking a blood sample to check your fasting blood sugar level. This should be done once every 3 years, after age 45, if you are within normal weight and without risk factors for diabetes. Testing should be considered at a younger age or be carried out more frequently if you are overweight and have at least 1 risk factor for diabetes.   Breast cancer screening is essential preventive care for women. You should practice "breast  self-awareness." This means understanding the normal appearance and feel of your breasts and may include breast self-examination. Any changes detected, no matter how small, should be reported to a caregiver. Women in their 20s and 30s should have a clinical breast exam (CBE) by a caregiver as part of a regular health exam every 1 to 3 years. After age 40, women should have a CBE every year. Starting at age 40, women should consider having a mammography (breast X-ray test) every year. Women who have a family history of breast cancer should talk to their caregiver about genetic screening. Women at a high risk of breast cancer should talk to their caregivers about having magnetic resonance imaging (MRI) and a mammography every year.   The Pap test is a screening test for cervical cancer. A Pap test can show cell changes on the cervix that might become cervical cancer if left untreated. A Pap test is a procedure in which cells are obtained and examined from the lower end of the uterus (cervix).   Women should have a Pap test starting at age 21.   Between ages 21 and 29, Pap tests should be repeated every 2 years.   Beginning at age 30, you should have a Pap test every 3 years as long as the past 3 Pap tests have been normal.   Some women have medical problems that increase the chance of getting cervical cancer. Talk to your caregiver about these problems. It is especially important to talk to your caregiver if a new problem develops soon after your last Pap test. In these cases, your caregiver may recommend more frequent screening and Pap tests.   The above recommendations are the same for women who have or have not gotten the vaccine for human papillomavirus (HPV).   If you had a hysterectomy for a problem that was not cancer or a condition that could lead to cancer, then you no longer need Pap tests. Even if you no longer need a Pap test, a regular exam is a good idea to make sure no other problems are  starting.   If you are between ages 65 and 70, and you have had normal Pap tests going back 10 years, you no longer need Pap tests. Even if you no longer need a Pap test, a regular exam is a good idea to make sure no other problems are starting.   If you have had past treatment for cervical cancer or a condition that could lead to cancer, you need Pap tests and screening for cancer for at least 20 years after your treatment.   If Pap tests have been discontinued, risk factors (such as a new sexual partner) need to be reassessed to determine if screening should be resumed.   The HPV test is an additional test that may be used for cervical cancer screening. The HPV test looks for the virus that can cause the cell changes on the cervix.   The cells collected during the Pap test can be tested for HPV. The HPV test could be used to screen women aged 30 years and older, and should be used in women of any age who have unclear Pap test results. After the age of 30, women should have HPV testing at the same frequency as a Pap test.   Colorectal cancer can be detected and often prevented. Most routine colorectal cancer screening begins at the age of 50 and continues through age 75. However, your caregiver may recommend screening at an earlier age if you have risk factors for colon cancer. On a yearly basis, your caregiver may provide home test kits to check for hidden blood in the stool. Use of a small camera at the end of a tube, to directly examine the colon (sigmoidoscopy or colonoscopy), can detect the earliest forms of colorectal cancer. Talk to your caregiver about this at age 50, when routine screening begins. Direct examination of the colon should be repeated every 5 to 10 years through age 75, unless early forms of pre-cancerous polyps or small growths are found.   Hepatitis C blood testing is recommended for all people born from 1945 through 1965 and any individual with known risks for hepatitis C.    Practice safe sex. Use condoms and avoid high-risk sexual practices to reduce the spread of sexually transmitted infections (STIs). STIs include gonorrhea, chlamydia, syphilis, trichomonas, herpes, HPV, and human immunodeficiency virus (HIV). Herpes, HIV, and HPV are viral illnesses that have no cure. They can result in disability, cancer, and death. Sexually active women aged 25 and younger should be checked for chlamydia. Older women with new or multiple partners should also be tested for chlamydia. Testing for other STIs is recommended if you are sexually active and at increased risk.   Osteoporosis is a disease in which the bones lose minerals and strength with aging. This can result in serious bone fractures. The risk of osteoporosis can be identified using a bone density scan. Women ages 65 and over and women at risk for fractures or osteoporosis should discuss screening with their caregivers. Ask your caregiver whether you should take a calcium supplement or vitamin D to reduce the rate of osteoporosis.   Menopause can be associated with physical symptoms and risks. Hormone replacement therapy is available to decrease symptoms and risks. You should talk to your caregiver about whether hormone replacement therapy is right for you.   Use sunscreen with sun protection factor (SPF) of 30 or more. Apply sunscreen liberally and repeatedly throughout the day. You should seek shade when your shadow is shorter than you. Protect yourself by wearing long sleeves, pants, a wide-brimmed hat, and sunglasses year round, whenever you are outdoors.   Once a month, do a whole body skin exam, using a mirror to look at the skin on your back. Notify your caregiver of new moles, moles that have irregular borders, moles that are larger than a pencil eraser, or moles that have changed in shape or color.   Stay current with required immunizations.   Influenza. You need a dose every fall (or winter). The composition of  the flu vaccine changes each year, so being vaccinated once is not enough.   Pneumococcal polysaccharide. You need 1 to 2 doses if you smoke cigarettes or if you have certain chronic medical conditions. You need 1 dose at age 65 (or older) if you have never been vaccinated.   Tetanus, diphtheria, pertussis (Tdap, Td). Get 1 dose of   Tdap vaccine if you are younger than age 65, are over 65 and have contact with an infant, are a healthcare worker, are pregnant, or simply want to be protected from whooping cough. After that, you need a Td booster dose every 10 years. Consult your caregiver if you have not had at least 3 tetanus and diphtheria-containing shots sometime in your life or have a deep or dirty wound.   HPV. You need this vaccine if you are a woman age 26 or younger. The vaccine is given in 3 doses over 6 months.   Measles, mumps, rubella (MMR). You need at least 1 dose of MMR if you were born in 1957 or later. You may also need a second dose.   Meningococcal. If you are age 19 to 21 and a first-year college student living in a residence hall, or have one of several medical conditions, you need to get vaccinated against meningococcal disease. You may also need additional booster doses.   Zoster (shingles). If you are age 60 or older, you should get this vaccine.   Varicella (chickenpox). If you have never had chickenpox or you were vaccinated but received only 1 dose, talk to your caregiver to find out if you need this vaccine.   Hepatitis A. You need this vaccine if you have a specific risk factor for hepatitis A virus infection or you simply wish to be protected from this disease. The vaccine is usually given as 2 doses, 6 to 18 months apart.   Hepatitis B. You need this vaccine if you have a specific risk factor for hepatitis B virus infection or you simply wish to be protected from this disease. The vaccine is given in 3 doses, usually over 6 months.  Preventive Services /  Frequency Ages 19 to 39  Blood pressure check.** / Every 1 to 2 years.   Lipid and cholesterol check.** / Every 5 years beginning at age 20.   Clinical breast exam.** / Every 3 years for women in their 20s and 30s.   Pap test.** / Every 2 years from ages 21 through 29. Every 3 years starting at age 30 through age 65 or 70 with a history of 3 consecutive normal Pap tests.   HPV screening.** / Every 3 years from ages 30 through ages 65 to 70 with a history of 3 consecutive normal Pap tests.   Hepatitis C blood test.** / For any individual with known risks for hepatitis C.   Skin self-exam. / Monthly.   Influenza immunization.** / Every year.   Pneumococcal polysaccharide immunization.** / 1 to 2 doses if you smoke cigarettes or if you have certain chronic medical conditions.   Tetanus, diphtheria, pertussis (Tdap, Td) immunization. / A one-time dose of Tdap vaccine. After that, you need a Td booster dose every 10 years.   HPV immunization. / 3 doses over 6 months, if you are 26 and younger.   Measles, mumps, rubella (MMR) immunization. / You need at least 1 dose of MMR if you were born in 1957 or later. You may also need a second dose.   Meningococcal immunization. / 1 dose if you are age 19 to 21 and a first-year college student living in a residence hall, or have one of several medical conditions, you need to get vaccinated against meningococcal disease. You may also need additional booster doses.   Varicella immunization.** / Consult your caregiver.   Hepatitis A immunization.** / Consult your caregiver. 2 doses, 6 to 18 months   apart.   Hepatitis B immunization.** / Consult your caregiver. 3 doses usually over 6 months.  Ages 40 to 64  Blood pressure check.** / Every 1 to 2 years.   Lipid and cholesterol check.** / Every 5 years beginning at age 20.   Clinical breast exam.** / Every year after age 40.   Mammogram.** / Every year beginning at age 40 and continuing for as  long as you are in good health. Consult with your caregiver.   Pap test.** / Every 3 years starting at age 30 through age 65 or 70 with a history of 3 consecutive normal Pap tests.   HPV screening.** / Every 3 years from ages 30 through ages 65 to 70 with a history of 3 consecutive normal Pap tests.   Fecal occult blood test (FOBT) of stool. / Every year beginning at age 50 and continuing until age 75. You may not need to do this test if you get a colonoscopy every 10 years.   Flexible sigmoidoscopy or colonoscopy.** / Every 5 years for a flexible sigmoidoscopy or every 10 years for a colonoscopy beginning at age 50 and continuing until age 75.   Hepatitis C blood test.** / For all people born from 1945 through 1965 and any individual with known risks for hepatitis C.   Skin self-exam. / Monthly.   Influenza immunization.** / Every year.   Pneumococcal polysaccharide immunization.** / 1 to 2 doses if you smoke cigarettes or if you have certain chronic medical conditions.   Tetanus, diphtheria, pertussis (Tdap, Td) immunization.** / A one-time dose of Tdap vaccine. After that, you need a Td booster dose every 10 years.   Measles, mumps, rubella (MMR) immunization. / You need at least 1 dose of MMR if you were born in 1957 or later. You may also need a second dose.   Varicella immunization.** / Consult your caregiver.   Meningococcal immunization.** / Consult your caregiver.   Hepatitis A immunization.** / Consult your caregiver. 2 doses, 6 to 18 months apart.   Hepatitis B immunization.** / Consult your caregiver. 3 doses, usually over 6 months.  Ages 65 and over  Blood pressure check.** / Every 1 to 2 years.   Lipid and cholesterol check.** / Every 5 years beginning at age 20.   Clinical breast exam.** / Every year after age 40.   Mammogram.** / Every year beginning at age 40 and continuing for as long as you are in good health. Consult with your caregiver.   Pap test.** /  Every 3 years starting at age 30 through age 65 or 70 with a 3 consecutive normal Pap tests. Testing can be stopped between 65 and 70 with 3 consecutive normal Pap tests and no abnormal Pap or HPV tests in the past 10 years.   HPV screening.** / Every 3 years from ages 30 through ages 65 or 70 with a history of 3 consecutive normal Pap tests. Testing can be stopped between 65 and 70 with 3 consecutive normal Pap tests and no abnormal Pap or HPV tests in the past 10 years.   Fecal occult blood test (FOBT) of stool. / Every year beginning at age 50 and continuing until age 75. You may not need to do this test if you get a colonoscopy every 10 years.   Flexible sigmoidoscopy or colonoscopy.** / Every 5 years for a flexible sigmoidoscopy or every 10 years for a colonoscopy beginning at age 50 and continuing until age 75.   Hepatitis   C blood test.** / For all people born from 1945 through 1965 and any individual with known risks for hepatitis C.   Osteoporosis screening.** / A one-time screening for women ages 65 and over and women at risk for fractures or osteoporosis.   Skin self-exam. / Monthly.   Influenza immunization.** / Every year.   Pneumococcal polysaccharide immunization.** / 1 dose at age 65 (or older) if you have never been vaccinated.   Tetanus, diphtheria, pertussis (Tdap, Td) immunization. / A one-time dose of Tdap vaccine if you are over 65 and have contact with an infant, are a healthcare worker, or simply want to be protected from whooping cough. After that, you need a Td booster dose every 10 years.   Varicella immunization.** / Consult your caregiver.   Meningococcal immunization.** / Consult your caregiver.   Hepatitis A immunization.** / Consult your caregiver. 2 doses, 6 to 18 months apart.   Hepatitis B immunization.** / Check with your caregiver. 3 doses, usually over 6 months.  ** Family history and personal history of risk and conditions may change your caregiver's  recommendations. Document Released: 04/13/2001 Document Revised: 02/04/2011 Document Reviewed: 07/13/2010 [employer] Patient Information 2012 [employer]. 

## 2011-08-17 NOTE — Progress Notes (Signed)
  Subjective:     Marisa Humphrey is a 31 y.o. female and is here for a comprehensive physical exam. The patient reports no problems.  History   Social History  . Marital Status: Married    Spouse Name: N/A    Number of Children: 1  . Years of Education: N/A   Occupational History  . nurse [employer]  .  [employer]   Social History Main Topics  . Smoking status: Former Smoker -- 0.3 packs/day for 16 years    Types: Cigarettes    Quit date: 10/17/2010  . Smokeless tobacco: Never Used  . Alcohol Use: No  . Drug Use: No  . Sexually Active: Yes -- Female partner(s)   Other Topics Concern  . Not on file   Social History Narrative   Exercising --no   Health Maintenance  Topic Date Due  . Influenza Vaccine  11/30/2011  . Tetanus/tdap  09/11/2012  . Pap Smear  12/09/2012    The following portions of the patient's history were reviewed and updated as appropriate: allergies, current medications, past family history, past medical history, past social history, past surgical history and problem list.  Review of Systems Review of Systems  Constitutional: Negative for activity change, appetite change and fatigue.  HENT: Negative for hearing loss, congestion, tinnitus and ear discharge.  dentist q60m Eyes: Negative for visual disturbance (see optho due) Respiratory: Negative for cough, chest tightness and shortness of breath.   Cardiovascular: Negative for chest pain, palpitations and leg swelling.  Gastrointestinal: Negative for abdominal pain, diarrhea, constipation and abdominal distention.  Genitourinary: Negative for urgency, frequency, decreased urine volume and difficulty urinating.  Musculoskeletal: Negative for back pain, arthralgias and gait problem.  Skin: Negative for color change, pallor and rash.  Neurological: Negative for dizziness, light-headedness, numbness and headaches.  Hematological: Negative for adenopathy. Does not bruise/bleed easily.    Psychiatric/Behavioral: Negative for suicidal ideas, confusion, sleep disturbance, self-injury, dysphoric mood, decreased concentration and agitation.       Objective:    BP 98/70  Pulse 72  Temp 98.9 F (37.2 C) (Oral)  Wt 249 lb (112.946 kg)  SpO2 99% General appearance: alert, cooperative, appears stated age and no distress Head: Normocephalic, without obvious abnormality, atraumatic Eyes: conjunctivae/corneas clear. PERRL, EOM's intact. Fundi benign. Ears: normal TM's and external ear canals both ears Nose: Nares normal. Septum midline. Mucosa normal. No drainage or sinus tenderness. Throat: lips, mucosa, and tongue normal; teeth and gums normal Neck: no adenopathy, no carotid bruit, no JVD, supple, symmetrical, trachea midline and thyroid not enlarged, symmetric, no tenderness/mass/nodules Back: symmetric, no curvature. ROM normal. No CVA tenderness. Lungs: clear to auscultation bilaterally Breasts:  Heart: regular rate and rhythm, S1, S2 normal, no murmur, click, rub or gallop Abdomen: soft, non-tender; bowel sounds normal; no masses,  no organomegaly Pelvic: gyn Extremities: extremities normal, atraumatic, no cyanosis or edema Pulses: 2+ and symmetric Skin: Skin color, texture, turgor normal. No rashes or lesions Lymph nodes: Cervical, supraclavicular, and axillary nodes normal. Neurologic: Alert and oriented X 3, normal strength and tone. Normal symmetric reflexes. Normal coordination and gait psych-- no depression / anxiety    Assessment:    Healthy female exam.   shift work sleep disorder--provigil   S/p lap band --f/u surgery for fill Plan:    check labs ghm  utd   See After Visit Summary for Counseling Recommendations

## 2011-09-30 ENCOUNTER — Ambulatory Visit: Payer: 59 | Admitting: *Deleted

## 2012-05-18 ENCOUNTER — Telehealth (INDEPENDENT_AMBULATORY_CARE_PROVIDER_SITE_OTHER): Payer: Self-pay

## 2012-05-18 NOTE — Telephone Encounter (Signed)
LMOM for pt letting her know that I have scheduled her to see MM on Thursday, May 22 @ 900a.

## 2012-06-23 ENCOUNTER — Telehealth: Payer: Self-pay | Admitting: General Practice

## 2012-06-23 DIAGNOSIS — G4726 Circadian rhythm sleep disorder, shift work type: Secondary | ICD-10-CM

## 2012-06-23 MED ORDER — MODAFINIL 100 MG PO TABS: 100.0000 mg | ORAL_TABLET | Freq: Every day | ORAL | Status: DC

## 2012-06-23 NOTE — Telephone Encounter (Signed)
Refill x1 

## 2012-06-23 NOTE — Telephone Encounter (Signed)
Pt called today wanting a refill on her provigil. Pt last seen 08/17/11 and med last filled on 08/17/11 #30 with 0 refills. Please advise.

## 2012-06-23 NOTE — Telephone Encounter (Signed)
Msg left advising Rx sent to the pharmacy.    KP

## 2012-07-19 ENCOUNTER — Encounter: Payer: Self-pay | Admitting: *Deleted

## 2012-07-19 ENCOUNTER — Emergency Department
Admission: EM | Admit: 2012-07-19 | Discharge: 2012-07-19 | Disposition: A | Discharge status: Home / Self Care | Payer: 59 | Source: Home / Self Care | Attending: Emergency Medicine | Admitting: Emergency Medicine

## 2012-07-19 DIAGNOSIS — L247 Irritant contact dermatitis due to plants, except food: Secondary | ICD-10-CM

## 2012-07-19 DIAGNOSIS — L255 Unspecified contact dermatitis due to plants, except food: Secondary | ICD-10-CM

## 2012-07-19 MED ORDER — HYDROXYZINE HCL 25 MG PO TABS: 25.0000 mg | ORAL_TABLET | Freq: Once | ORAL | Status: DC

## 2012-07-19 MED ORDER — METHYLPREDNISOLONE ACETATE 80 MG/ML IJ SUSP
80.0000 mg | Freq: Once | INTRAMUSCULAR | Status: AC
  Administered 2012-07-19: 80 mg via INTRAMUSCULAR

## 2012-07-19 MED ORDER — TRIAMCINOLONE ACETONIDE 0.1 % EX CREA: TOPICAL_CREAM | CUTANEOUS | Status: DC

## 2012-07-19 MED ORDER — PREDNISONE (PAK) 10 MG PO TABS: ORAL_TABLET | ORAL | Status: DC

## 2012-07-19 NOTE — ED Provider Notes (Signed)
History     CSN: [id number]  Arrival date & time 07/19/12  0909   First MD Initiated Contact with Patient 07/19/12 [phone]      Chief Complaint  Patient presents with  . Poison Ivy    (Consider location/radiation/quality/duration/timing/severity/associated sxs/prior treatment) The history is provided by the patient.   exposed to poison ivy 5 days ago, now with severe worsening spreading pruritic rash on trunk, now spreading to her face. OTC meds not helping. No nausea or vomiting or fever. Few of the areas have clear drainage but no pus.  Past Medical History  Diagnosis Date  . Depression   . GERD (gastroesophageal reflux disease)     Past Surgical History  Procedure Laterality Date  . Laparoscopic gastric banding  11/09/2005    Dr.Martin  . Mole removal      Family History  Problem Relation Age of Onset  . Heart attack Father 24  . Heart disease Father 70    MI  . Diabetes Mother   . Cancer Other     breast    History  Substance Use Topics  . Smoking status: Current Every Day Smoker -- 0.50 packs/day for 16 years    Types: Cigarettes  . Smokeless tobacco: Never Used  . Alcohol Use: No    OB History   Grav Para Term Preterm Abortions TAB SAB Ect Mult Living                  Review of Systems  All other systems reviewed and are negative.    Allergies  Review of patient's allergies indicates no known allergies.  Home Medications   Current Outpatient Rx  Name  Route  Sig  Dispense  Refill  . etonogestrel-ethinyl estradiol (NUVARING) 0.12-0.015 MG/24HR vaginal ring   Vaginal   Place 1 each vaginally every 28 (twenty-eight) days. Insert vaginally and leave in place for 3 consecutive weeks, then remove for 1 week.          Marland Kitchen EXPIRED: fluticasone (FLONASE) 50 MCG/ACT nasal spray   Nasal   2 sprays by Nasal route daily.   16 g   2   . hydrOXYzine (ATARAX/VISTARIL) 25 MG tablet   Oral   Take 1 tablet (25 mg total) by mouth once. Take 1 at bedtime as  needed for itch. May cause drowsiness.   10 tablet   0   . modafinil (PROVIGIL) 100 MG tablet   Oral   Take 1 tablet (100 mg total) by mouth daily.   30 tablet   0   . predniSONE (STERAPRED UNI-PAK) 10 MG tablet      Take as directed for 6 days.--Take 6 on day 1, 5 on day 2, 4 on day 3, then 3 on day 4, then 2  on day 5, then 1 on day 6.   21 tablet   0   . triamcinolone cream (KENALOG) 0.1 %      Apply to affected areas 2 or 3 times daily as directed.   30 g   0     BP 118/84  Pulse 72  Temp(Src) 98.5 F (36.9 C) (Oral)  Resp 16  Ht 5\' 7"  (1.702 m)  Wt 242 lb (109.77 kg)  BMI 37.89 kg/m2  SpO2 99%  LMP 07/19/2012  Physical Exam  Nursing note and vitals reviewed. Constitutional: She is oriented to person, place, and time. She appears well-developed and well-nourished. No distress.  HENT:  Head: Normocephalic and atraumatic.  Eyes:  Conjunctivae and EOM are normal. Pupils are equal, round, and reactive to light. No scleral icterus.  Neck: Normal range of motion.  Cardiovascular: Normal rate.   Pulmonary/Chest: Effort normal.  Abdominal: She exhibits no distension.  Musculoskeletal: Normal range of motion.  Neurological: She is alert and oriented to person, place, and time.  Skin: Skin is warm.  Severe poison ivy type rash, erythematous, vesicular, maculopapular eruption, in streaks and in clusters, chest, arms, neck and to a lesser extent face. Eyes are not involved. Some of the lesions have clear fluid. No blood or pus. No fluctuance.  Psychiatric: She has a normal mood and affect.    ED Course  Procedures (including critical care time)  Labs Reviewed - No data to display No results found.   1. Contact dermatitis and eczema due to plant       MDM  Treatment options discussed. After risks, benefits, alternatives discussed, treat with: Depo-Medrol 80 mg IM. Prednisone 6 day Dosepak. Hydroxyzine when necessary itch. Triamcinolone cream  topically. Other measures discussed. See detailed Instructions in AVS, which were given to patient. Verbal instructions also given. Risks, benefits, and alternatives of treatment options discussed. Questions invited and answered. Patient voiced understanding and agreement with plans.        Lajean Manes, MD 07/19/12 1031

## 2012-07-19 NOTE — ED Notes (Signed)
Marisa Humphrey reports poison ivy rash x yesterday. Taken benadryl otc.

## 2012-07-20 ENCOUNTER — Encounter (INDEPENDENT_AMBULATORY_CARE_PROVIDER_SITE_OTHER): Payer: 59 | Admitting: Surgery

## 2012-10-02 ENCOUNTER — Other Ambulatory Visit: Payer: Self-pay | Admitting: Family Medicine

## 2012-11-30 ENCOUNTER — Encounter (INDEPENDENT_AMBULATORY_CARE_PROVIDER_SITE_OTHER): Payer: Self-pay

## 2012-11-30 ENCOUNTER — Ambulatory Visit (INDEPENDENT_AMBULATORY_CARE_PROVIDER_SITE_OTHER): Payer: Commercial Managed Care - PPO | Admitting: Physician Assistant

## 2012-11-30 ENCOUNTER — Ambulatory Visit
Admission: RE | Admit: 2012-11-30 | Discharge: 2012-11-30 | Disposition: A | Discharge status: Home / Self Care | Payer: 59 | Source: Ambulatory Visit | Attending: Physician Assistant | Admitting: Physician Assistant

## 2012-11-30 VITALS — BP 108/78 | HR 68 | Temp 98.8°F | Resp 16 | Ht 67.0 in | Wt 246.6 lb

## 2012-11-30 DIAGNOSIS — R1310 Dysphagia, unspecified: Secondary | ICD-10-CM

## 2012-11-30 DIAGNOSIS — Z4651 Encounter for fitting and adjustment of gastric lap band: Secondary | ICD-10-CM

## 2012-11-30 NOTE — Progress Notes (Signed)
  HISTORY: Marisa Humphrey is a 32 y.o.female who received an 10cm lap-band in September 2007 by Dr. Daphine Deutscher. She was last seen in March 2012. Since then she's lost 15 lbs, but unfortunately she's not capable of tolerating solid foods. This has gone on for some time now. She's relying on ice cream and mashed potatoes as she's unable to eat things like chicken and vegetables. She sees a need to get her health under better control so she's here now to get back on track. She has no difficulty with liquids at all.  VITAL SIGNS: Filed Vitals:   11/30/12 0846  BP: 108/78  Pulse: 68  Temp: 98.8 F (37.1 C)  Resp: 16    PHYSICAL EXAM: Physical exam reveals a very well-appearing 32 y.o.female in no apparent distress Neurologic: Awake, alert, oriented Psych: Bright affect, conversant Respiratory: Breathing even and unlabored. No stridor or wheezing Abdomen: Soft, nontender, nondistended to palpation. Incisions well-healed. No incisional hernias. Port easily palpated. Extremities: Atraumatic, good range of motion.  ASSESMENT: 32 y.o.  female  s/p 10cm lap-band.   PLAN: The patient's port was accessed with a 20G Huber needle without difficulty. Clear fluid was aspirated and 2 mL saline was removed from the port to give a total predicted volume of 0 mL. The patient was advised to concentrate on healthy food choices and to avoid slider foods high in fats and carbohydrates. I've ordered a KUB to evaluate band position. I've encouraged her to visit with [facility] in nutrition as this is essential to continue losing weight. I'll have her back in 2-3 weeks to re-evaluate and possibly begin band fills to give her restriction without dysphagia.

## 2012-11-30 NOTE — Patient Instructions (Signed)
Obtain your x-ray today. Return in two weeks. Focus on good food choices as well as physical activity. Return sooner if you have an increase in hunger, portion sizes or weight. Return also for difficulty swallowing, night cough, reflux.

## 2012-12-21 ENCOUNTER — Encounter (INDEPENDENT_AMBULATORY_CARE_PROVIDER_SITE_OTHER): Payer: Commercial Managed Care - PPO

## 2013-02-06 ENCOUNTER — Ambulatory Visit: Payer: 59 | Admitting: Dietician

## 2013-02-13 ENCOUNTER — Ambulatory Visit: Payer: 59 | Admitting: Family Medicine

## 2013-03-01 NOTE — L&D Delivery Note (Signed)
Delivery Note At 1:19 PM a viable and healthy female was delivered via Vaginal, Spontaneous Delivery (Presentation: Left Occiput Anterior).  APGAR: 8, 9; weight -pending   Placenta status: Intact, Spontaneous.  Cord: 3 vessels with the following complications: None.  Cord pH: N/A  Anesthesia: Epidural  Episiotomy: None Lacerations: 1st degree;Perineal Suture Repair: 3.0 vicryl rapide Est. Blood Loss (mL): 250 cc  Mom to postpartum.  Baby to Couplet care / Skin to Skin.  Zacharie Portner R 09/23/2013, 2:02 PM

## 2013-03-08 LAB — OB RESULTS CONSOLE HEPATITIS B SURFACE ANTIGEN: HEP B S AG: NEGATIVE

## 2013-03-08 LAB — OB RESULTS CONSOLE GC/CHLAMYDIA
Chlamydia: NEGATIVE
GC PROBE AMP, GENITAL: NEGATIVE

## 2013-03-08 LAB — OB RESULTS CONSOLE RPR: RPR: NONREACTIVE

## 2013-03-08 LAB — OB RESULTS CONSOLE ABO/RH: RH Type: POSITIVE

## 2013-03-08 LAB — OB RESULTS CONSOLE ANTIBODY SCREEN: Antibody Screen: NEGATIVE

## 2013-03-08 LAB — OB RESULTS CONSOLE RUBELLA ANTIBODY, IGM: RUBELLA: IMMUNE

## 2013-03-08 LAB — OB RESULTS CONSOLE HIV ANTIBODY (ROUTINE TESTING): HIV: NONREACTIVE

## 2013-04-16 ENCOUNTER — Encounter: Payer: Self-pay | Admitting: Emergency Medicine

## 2013-04-16 ENCOUNTER — Other Ambulatory Visit: Payer: Self-pay | Admitting: Emergency Medicine

## 2013-04-16 ENCOUNTER — Emergency Department
Admission: EM | Admit: 2013-04-16 | Discharge: 2013-04-16 | Disposition: A | Discharge status: Home / Self Care | Payer: 59 | Source: Home / Self Care | Attending: Emergency Medicine | Admitting: Emergency Medicine

## 2013-04-16 DIAGNOSIS — J029 Acute pharyngitis, unspecified: Secondary | ICD-10-CM

## 2013-04-16 DIAGNOSIS — J111 Influenza due to unidentified influenza virus with other respiratory manifestations: Secondary | ICD-10-CM

## 2013-04-16 DIAGNOSIS — R5381 Other malaise: Secondary | ICD-10-CM

## 2013-04-16 DIAGNOSIS — R6883 Chills (without fever): Secondary | ICD-10-CM

## 2013-04-16 DIAGNOSIS — R5383 Other fatigue: Secondary | ICD-10-CM

## 2013-04-16 LAB — POCT RAPID STREP A (OFFICE): Rapid Strep A Screen: NEGATIVE

## 2013-04-16 LAB — POCT INFLUENZA A/B
Influenza A, POC: NEGATIVE
Influenza B, POC: NEGATIVE

## 2013-04-16 MED ORDER — OSELTAMIVIR PHOSPHATE 75 MG PO CAPS: ORAL_CAPSULE | ORAL | Status: DC

## 2013-04-16 NOTE — ED Notes (Signed)
Congestion, sore throat, runny nose, cough, body aches, fever since yesterday
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
## 2013-04-17 LAB — INFLUENZA A AND B
Inflenza A Ag: NEGATIVE
Influenza B Ag: NEGATIVE
Source-INFBD: 0

## 2013-04-17 LAB — STREP A DNA PROBE: GASP: NEGATIVE

## 2013-04-19 ENCOUNTER — Telehealth: Payer: Self-pay | Admitting: *Deleted

## 2013-04-27 ENCOUNTER — Other Ambulatory Visit: Payer: Self-pay

## 2013-05-21 ENCOUNTER — Other Ambulatory Visit (HOSPITAL_COMMUNITY): Payer: Self-pay | Admitting: Obstetrics

## 2013-05-21 DIAGNOSIS — Z3689 Encounter for other specified antenatal screening: Secondary | ICD-10-CM

## 2013-05-21 DIAGNOSIS — O2690 Pregnancy related conditions, unspecified, unspecified trimester: Secondary | ICD-10-CM

## 2013-05-22 ENCOUNTER — Ambulatory Visit (HOSPITAL_COMMUNITY)
Admission: RE | Admit: 2013-05-22 | Discharge: 2013-05-22 | Disposition: A | Discharge status: Home / Self Care | Payer: 59 | Source: Ambulatory Visit | Attending: Obstetrics | Admitting: Obstetrics

## 2013-05-22 ENCOUNTER — Encounter (HOSPITAL_COMMUNITY): Payer: Self-pay

## 2013-05-22 DIAGNOSIS — O99210 Obesity complicating pregnancy, unspecified trimester: Secondary | ICD-10-CM

## 2013-05-22 DIAGNOSIS — Z3689 Encounter for other specified antenatal screening: Secondary | ICD-10-CM

## 2013-05-22 DIAGNOSIS — O2690 Pregnancy related conditions, unspecified, unspecified trimester: Secondary | ICD-10-CM

## 2013-05-22 DIAGNOSIS — Z1389 Encounter for screening for other disorder: Secondary | ICD-10-CM | POA: Insufficient documentation

## 2013-05-22 DIAGNOSIS — O358XX0 Maternal care for other (suspected) fetal abnormality and damage, not applicable or unspecified: Secondary | ICD-10-CM | POA: Insufficient documentation

## 2013-05-22 DIAGNOSIS — Z363 Encounter for antenatal screening for malformations: Secondary | ICD-10-CM | POA: Insufficient documentation

## 2013-05-22 DIAGNOSIS — E669 Obesity, unspecified: Secondary | ICD-10-CM | POA: Insufficient documentation

## 2013-09-05 LAB — OB RESULTS CONSOLE GBS: STREP GROUP B AG: NEGATIVE

## 2013-09-20 ENCOUNTER — Encounter (HOSPITAL_COMMUNITY): Payer: Self-pay | Admitting: *Deleted

## 2013-09-20 ENCOUNTER — Telehealth (HOSPITAL_COMMUNITY): Payer: Self-pay | Admitting: *Deleted

## 2013-09-20 NOTE — Telephone Encounter (Signed)
Preadmission screen  

## 2013-09-21 ENCOUNTER — Encounter (HOSPITAL_COMMUNITY): Payer: Self-pay | Admitting: *Deleted

## 2013-09-23 ENCOUNTER — Encounter (HOSPITAL_COMMUNITY): Payer: Self-pay | Admitting: *Deleted

## 2013-09-23 ENCOUNTER — Inpatient Hospital Stay (HOSPITAL_COMMUNITY)
Admission: AD | Admit: 2013-09-23 | Discharge: 2013-09-25 | DRG: 775 | Disposition: A | Discharge status: Home / Self Care | Payer: 59 | Source: Ambulatory Visit | Attending: Obstetrics & Gynecology | Admitting: Obstetrics & Gynecology

## 2013-09-23 ENCOUNTER — Inpatient Hospital Stay (HOSPITAL_COMMUNITY): Payer: 59 | Admitting: Anesthesiology

## 2013-09-23 ENCOUNTER — Encounter (HOSPITAL_COMMUNITY): Payer: 59 | Admitting: Anesthesiology

## 2013-09-23 DIAGNOSIS — Z87891 Personal history of nicotine dependence: Secondary | ICD-10-CM

## 2013-09-23 DIAGNOSIS — Z833 Family history of diabetes mellitus: Secondary | ICD-10-CM

## 2013-09-23 DIAGNOSIS — Z6841 Body Mass Index (BMI) 40.0 and over, adult: Secondary | ICD-10-CM

## 2013-09-23 DIAGNOSIS — Z8249 Family history of ischemic heart disease and other diseases of the circulatory system: Secondary | ICD-10-CM

## 2013-09-23 DIAGNOSIS — O99214 Obesity complicating childbirth: Secondary | ICD-10-CM

## 2013-09-23 DIAGNOSIS — O99344 Other mental disorders complicating childbirth: Secondary | ICD-10-CM | POA: Diagnosis present

## 2013-09-23 DIAGNOSIS — E669 Obesity, unspecified: Secondary | ICD-10-CM | POA: Diagnosis present

## 2013-09-23 DIAGNOSIS — K219 Gastro-esophageal reflux disease without esophagitis: Secondary | ICD-10-CM | POA: Diagnosis present

## 2013-09-23 DIAGNOSIS — F341 Dysthymic disorder: Secondary | ICD-10-CM | POA: Diagnosis present

## 2013-09-23 LAB — TYPE AND SCREEN
ABO/RH(D): A POS
ANTIBODY SCREEN: NEGATIVE

## 2013-09-23 LAB — CBC
HCT: 37.2 % (ref 36.0–46.0)
Hemoglobin: 12.2 g/dL (ref 12.0–15.0)
MCH: 27.5 pg (ref 26.0–34.0)
MCHC: 32.8 g/dL (ref 30.0–36.0)
MCV: 84 fL (ref 78.0–100.0)
Platelets: 313 10*3/uL (ref 150–400)
RBC: 4.43 MIL/uL (ref 3.87–5.11)
RDW: 15.8 % — ABNORMAL HIGH (ref 11.5–15.5)
WBC: 13.5 10*3/uL — ABNORMAL HIGH (ref 4.0–10.5)

## 2013-09-23 LAB — RPR

## 2013-09-23 MED ORDER — IBUPROFEN 600 MG PO TABS: 600.0000 mg | ORAL_TABLET | Freq: Four times a day (QID) | ORAL | Status: DC | PRN

## 2013-09-23 MED ORDER — DIPHENHYDRAMINE HCL 50 MG/ML IJ SOLN
12.5000 mg | INTRAMUSCULAR | Status: DC | PRN
  Administered 2013-09-23 (×2): 12.5 mg via INTRAVENOUS
  Filled 2013-09-23 (×2): qty 1

## 2013-09-23 MED ORDER — PHENYLEPHRINE 40 MCG/ML (10ML) SYRINGE FOR IV PUSH (FOR BLOOD PRESSURE SUPPORT)
80.0000 ug | PREFILLED_SYRINGE | INTRAVENOUS | Status: DC | PRN
  Filled 2013-09-23: qty 2

## 2013-09-23 MED ORDER — DIBUCAINE 1 % RE OINT: 1.0000 "application " | TOPICAL_OINTMENT | RECTAL | Status: DC | PRN

## 2013-09-23 MED ORDER — ONDANSETRON HCL 4 MG PO TABS: 4.0000 mg | ORAL_TABLET | ORAL | Status: DC | PRN

## 2013-09-23 MED ORDER — OXYCODONE-ACETAMINOPHEN 5-325 MG PO TABS: 1.0000 | ORAL_TABLET | ORAL | Status: DC | PRN

## 2013-09-23 MED ORDER — PHENYLEPHRINE 40 MCG/ML (10ML) SYRINGE FOR IV PUSH (FOR BLOOD PRESSURE SUPPORT)
80.0000 ug | PREFILLED_SYRINGE | INTRAVENOUS | Status: DC | PRN
  Filled 2013-09-23: qty 10
  Filled 2013-09-23: qty 2

## 2013-09-23 MED ORDER — LANOLIN HYDROUS EX OINT: TOPICAL_OINTMENT | CUTANEOUS | Status: DC | PRN

## 2013-09-23 MED ORDER — SENNOSIDES-DOCUSATE SODIUM 8.6-50 MG PO TABS
2.0000 | ORAL_TABLET | ORAL | Status: DC
  Administered 2013-09-23 – 2013-09-24 (×2): 2 via ORAL
  Filled 2013-09-23 (×2): qty 2

## 2013-09-23 MED ORDER — LACTATED RINGERS IV SOLN: 500.0000 mL | Freq: Once | INTRAVENOUS | Status: DC

## 2013-09-23 MED ORDER — DIPHENHYDRAMINE HCL 25 MG PO CAPS: 25.0000 mg | ORAL_CAPSULE | Freq: Four times a day (QID) | ORAL | Status: DC | PRN

## 2013-09-23 MED ORDER — CITRIC ACID-SODIUM CITRATE 334-500 MG/5ML PO SOLN: 30.0000 mL | ORAL | Status: DC | PRN

## 2013-09-23 MED ORDER — EPHEDRINE 5 MG/ML INJ
10.0000 mg | INTRAVENOUS | Status: DC | PRN
  Filled 2013-09-23: qty 2

## 2013-09-23 MED ORDER — OXYCODONE-ACETAMINOPHEN 5-325 MG PO TABS
1.0000 | ORAL_TABLET | ORAL | Status: DC | PRN
  Administered 2013-09-24 – 2013-09-25 (×6): 1 via ORAL
  Filled 2013-09-23 (×6): qty 1

## 2013-09-23 MED ORDER — OXYTOCIN BOLUS FROM INFUSION: 500.0000 mL | INTRAVENOUS | Status: DC

## 2013-09-23 MED ORDER — PRENATAL MULTIVITAMIN CH
1.0000 | ORAL_TABLET | Freq: Every day | ORAL | Status: DC
  Administered 2013-09-24: 1 via ORAL
  Filled 2013-09-23: qty 1

## 2013-09-23 MED ORDER — FENTANYL 2.5 MCG/ML BUPIVACAINE 1/10 % EPIDURAL INFUSION (WH - ANES)
INTRAMUSCULAR | Status: DC | PRN
  Administered 2013-09-23: 14 mL/h via EPIDURAL

## 2013-09-23 MED ORDER — TERBUTALINE SULFATE 1 MG/ML IJ SOLN: 0.2500 mg | Freq: Once | INTRAMUSCULAR | Status: DC | PRN

## 2013-09-23 MED ORDER — WITCH HAZEL-GLYCERIN EX PADS
1.0000 "application " | MEDICATED_PAD | CUTANEOUS | Status: DC | PRN
  Administered 2013-09-24: 1 via TOPICAL

## 2013-09-23 MED ORDER — BENZOCAINE-MENTHOL 20-0.5 % EX AERO
1.0000 "application " | INHALATION_SPRAY | CUTANEOUS | Status: DC | PRN
  Administered 2013-09-23: 1 via TOPICAL
  Filled 2013-09-23: qty 56

## 2013-09-23 MED ORDER — SIMETHICONE 80 MG PO CHEW: 80.0000 mg | CHEWABLE_TABLET | ORAL | Status: DC | PRN

## 2013-09-23 MED ORDER — FLEET ENEMA 7-19 GM/118ML RE ENEM: 1.0000 | ENEMA | RECTAL | Status: DC | PRN

## 2013-09-23 MED ORDER — ONDANSETRON HCL 4 MG/2ML IJ SOLN
4.0000 mg | Freq: Four times a day (QID) | INTRAMUSCULAR | Status: DC | PRN
  Administered 2013-09-23: 4 mg via INTRAVENOUS
  Filled 2013-09-23: qty 2

## 2013-09-23 MED ORDER — LACTATED RINGERS IV SOLN: 500.0000 mL | INTRAVENOUS | Status: DC | PRN

## 2013-09-23 MED ORDER — LIDOCAINE HCL (PF) 1 % IJ SOLN
INTRAMUSCULAR | Status: DC | PRN
Start: 2013-09-23 — End: 2013-09-24
  Administered 2013-09-23: 3 mL
  Administered 2013-09-23 (×2): 5 mL

## 2013-09-23 MED ORDER — ACETAMINOPHEN 325 MG PO TABS: 650.0000 mg | ORAL_TABLET | ORAL | Status: DC | PRN

## 2013-09-23 MED ORDER — ZOLPIDEM TARTRATE 5 MG PO TABS: 5.0000 mg | ORAL_TABLET | Freq: Every evening | ORAL | Status: DC | PRN

## 2013-09-23 MED ORDER — OXYTOCIN 40 UNITS IN LACTATED RINGERS INFUSION - SIMPLE MED
62.5000 mL/h | INTRAVENOUS | Status: DC
  Administered 2013-09-23: 62.5 mL/h via INTRAVENOUS
  Filled 2013-09-23: qty 1000

## 2013-09-23 MED ORDER — OXYTOCIN 40 UNITS IN LACTATED RINGERS INFUSION - SIMPLE MED
1.0000 m[IU]/min | INTRAVENOUS | Status: DC
  Administered 2013-09-23: 2 m[IU]/min via INTRAVENOUS

## 2013-09-23 MED ORDER — IBUPROFEN 600 MG PO TABS
600.0000 mg | ORAL_TABLET | Freq: Four times a day (QID) | ORAL | Status: DC
  Administered 2013-09-23 – 2013-09-25 (×7): 600 mg via ORAL
  Filled 2013-09-23 (×6): qty 1

## 2013-09-23 MED ORDER — LACTATED RINGERS IV SOLN
INTRAVENOUS | Status: DC
  Administered 2013-09-23 (×2): via INTRAVENOUS

## 2013-09-23 MED ORDER — ONDANSETRON HCL 4 MG/2ML IJ SOLN: 4.0000 mg | INTRAMUSCULAR | Status: DC | PRN

## 2013-09-23 MED ORDER — TETANUS-DIPHTH-ACELL PERTUSSIS 5-2.5-18.5 LF-MCG/0.5 IM SUSP: 0.5000 mL | Freq: Once | INTRAMUSCULAR | Status: DC

## 2013-09-23 MED ORDER — LIDOCAINE HCL (PF) 1 % IJ SOLN
30.0000 mL | INTRAMUSCULAR | Status: DC | PRN
  Administered 2013-09-23: 30 mL via SUBCUTANEOUS
  Filled 2013-09-23: qty 30

## 2013-09-23 MED ORDER — FENTANYL 2.5 MCG/ML BUPIVACAINE 1/10 % EPIDURAL INFUSION (WH - ANES)
14.0000 mL/h | INTRAMUSCULAR | Status: DC | PRN
  Filled 2013-09-23: qty 125

## 2013-09-23 NOTE — H&P (Signed)
Marisa Humphrey is a 33 y.o. female presenting at 36 wks in active labor. [id number]. Has regular ctxns, good FMs. No leaking.  PNCare Dr Pamala Hurry primary- [place]. Pt with Lab Band, normal labs, incl B12, D, no pain in this pregnancy.  Depression but coping well, got Wellbutrin Rx but didn't start, will watch for PP depression.    History OB History   Grav Para Term Preterm Abortions TAB SAB Ect Mult Living   3 1 1  1  1   1      Past Medical History  Diagnosis Date  . Depression   . GERD (gastroesophageal reflux disease)   . Anxiety   . History of condyloma acuminatum   . Hx of varicella   . External hemorrhoids without mention of complication    Past Surgical History  Procedure Laterality Date  . Laparoscopic gastric banding  11/09/2005    Dr.Martin  . Mole removal     Family History: family history includes Cancer in her other; Diabetes in her maternal grandmother and mother; Heart attack (age of onset: 52) in her father; Heart disease (age of onset: 90) in her father. Social History:  reports that she has quit smoking. Her smoking use included Cigarettes. She has a 8 pack-year smoking history. She has never used smokeless tobacco. She reports that she does not drink alcohol or use illicit drugs.   Prenatal Transfer Tool  Maternal Diabetes: No (passed 3 hr GTT) Genetic Screening: Normal QUAD scr negative Maternal Ultrasounds/Referrals: Normal Fetal Ultrasounds or other Referrals:  Referred to Materal Fetal Medicine   Needed Cardiac views  Maternal Substance Abuse:  No Significant Maternal Medications:  None Significant Maternal Lab Results:  Lab values include: Group B Strep negative Other Comments:  None  ROS neg  Dilation: 4 Effacement (%): 90 Station: -2 Exam by:: B Mosca RN Blood pressure 120/83, pulse 101, temperature 97.9 F (36.6 C), temperature source Oral, resp. rate 20, height 5\' 7"  (1.702 m), weight 295 lb (133.811 kg), last menstrual period  12/18/2012. Exam Physical Exam  A&O x 3, no acute distress. Pleasant HEENT neg, no thyromegaly Lungs CTA bilat CV RRR, A1S2 normal Abdo soft, non tender, non acute Extr no edema/ tenderness Pelvic 4/80%/-2/ intact, pelvis good.  FHT 150s/ mod variab/ +accels/ no decels- category I Toco regular q 3-5 min  Prenatal labs: ABO, Rh: A/Positive/-- (01/08 0000) Antibody: Negative (01/08 0000) Rubella: Immune (01/08 0000) RPR: Nonreactive (01/08 0000)  HBsAg: Negative (01/08 0000)  HIV: Non-reactive (01/08 0000)  GBS: Negative (07/08 0000)   Assessment/Plan: 33 yo, G3P1011 at 40 wks in labor. GBS neg. EFW 7.1/2-8 lbs. Anticipate SVD.  Watch for PP depression.    Duanna Runk R 09/23/2013, 7:33 AM

## 2013-09-23 NOTE — Lactation Note (Signed)
This note was copied from the chart of Marisa Humphrey. Lactation Consultation Note  Patient Name: Marisa Shakeeta Godette [id number] Date: 09/23/2013 Reason for consult: Follow-up assessment Mom has history of LMS with 1st child. LC notes that Mom has wide spacing between breasts 4FB, assymetrical shape, the left breast does not appear to have much glandular tissue. Mom does reports some breast tenderness early pregnancy. Baby has latched well few times but been sleepy and spitty with the last few feedings. It took several attempts to get baby latched at this visit, but she demonstrated a good rhythmic suck for about 10 minutes then became sleepy. Demonstrated hand expression and received 1 drop of colostrum from right breast. Basic teaching reviewed with Mom. Advised to watch for breast changes as her milk should come in 3-5 days. Monitor baby's voids/stools, weight loss. Consider post pumping to maximize milk production. Discussed supplements to start to increase milk production. Lactation brochure left for review. Advised of OP services and support group. Encouraged to call for assist as needed.   Maternal Data Formula Feeding for Exclusion: No Infant to breast within first hour of birth: Yes Has patient been taught Hand Expression?: Yes Does the patient have breastfeeding experience prior to this delivery?: Yes  Feeding Feeding Type: Breast Fed Length of feed: 10 min  LATCH Score/Interventions Latch: Repeated attempts needed to sustain latch, nipple held in mouth throughout feeding, stimulation needed to elicit sucking reflex. Intervention(s): Adjust position;Assist with latch;Breast massage;Breast compression  Audible Swallowing: None  Type of Nipple: Flat (compressible)  Comfort (Breast/Nipple): Soft / non-tender     Hold (Positioning): Assistance needed to correctly position infant at breast and maintain latch. Intervention(s): Breastfeeding basics reviewed;Support Pillows;Position  options;Skin to skin  LATCH Score: 5  Lactation Tools Discussed/Used WIC Program: No   Consult Status Consult Status: Follow-up Date: 09/24/13 Follow-up type: In-patient    Katrine Coho 09/23/2013, 10:40 PM

## 2013-09-23 NOTE — MAU Note (Signed)
Pt reports uc's since 0140

## 2013-09-23 NOTE — Progress Notes (Signed)
Patient ID: Marisa Humphrey, female   DOB: Aug 09, 1980, 33 y.o.   MRN: [id number] Subjective: No complaints, feels better with epidural   Objective: BP 139/62  Pulse 91  Temp(Src) 97.9 F (36.6 C) (Oral)  Resp 20  Ht 5\' 7"  (1.702 m)  Wt 295 lb (133.811 kg)  BMI 46.19 kg/m2  SpO2 98%  LMP 12/18/2012  FHT:  FHR: 145 bpm, variability: moderate,  accelerations:  Present,  decelerations:  Absent UC:   regular, every 5 minutes SVE:   Dilation: 5 Effacement (%): 100 Station: -3 Exam by:: Jaben Benegas AROM, clear fluid, head was floating but after controlled AROM it came down against cervix.   Assessment / Plan: Spontaneous labor, progressing normally but UCs are q 5 min, will start low dose pitocin and reassess descent/progress.   Fetal Wellbeing:  Category I Pain Control:  Epidural  Anticipated MOD:  NSVD, EFW 7.1/2-8 lbs  Omeka Holben R 09/23/2013, 11:04 AM

## 2013-09-23 NOTE — Anesthesia Preprocedure Evaluation (Signed)
Anesthesia Evaluation  Patient identified by MRN, date of birth, ID band Patient awake    Reviewed: Allergy & Precautions, H&P , NPO status , Patient's Chart, lab work & pertinent test results  History of Anesthesia Complications Negative for: history of anesthetic complications  Airway Mallampati: II TM Distance: >3 FB Neck ROM: full    Dental no notable dental hx. (+) Teeth Intact   Pulmonary neg pulmonary ROS, former smoker,  breath sounds clear to auscultation  Pulmonary exam normal       Cardiovascular negative cardio ROS  Rhythm:regular Rate:Normal     Neuro/Psych negative neurological ROS  negative psych ROS   GI/Hepatic negative GI ROS, Neg liver ROS,   Endo/Other  negative endocrine ROSMorbid obesity  Renal/GU negative Renal ROS  negative genitourinary   Musculoskeletal   Abdominal Normal abdominal exam  (+)   Peds  Hematology negative hematology ROS (+)   Anesthesia Other Findings   Reproductive/Obstetrics (+) Pregnancy                           Anesthesia Physical Anesthesia Plan  ASA: III  Anesthesia Plan: Epidural   Post-op Pain Management:    Induction:   Airway Management Planned:   Additional Equipment:   Intra-op Plan:   Post-operative Plan:   Informed Consent: I have reviewed the patients History and Physical, chart, labs and discussed the procedure including the risks, benefits and alternatives for the proposed anesthesia with the patient or authorized representative who has indicated his/her understanding and acceptance.     Plan Discussed with:   Anesthesia Plan Comments:         Anesthesia Quick Evaluation

## 2013-09-23 NOTE — Anesthesia Procedure Notes (Signed)
Epidural Patient location during procedure: OB  Staffing Anesthesiologist: Montez Hageman Performed by: anesthesiologist   Preanesthetic Checklist Completed: patient identified, site marked, surgical consent, pre-op evaluation, timeout performed, IV checked, risks and benefits discussed and monitors and equipment checked  Epidural Patient position: sitting Prep: ChloraPrep Patient monitoring: heart rate, continuous pulse ox and blood pressure Approach: right paramedian Location: L3-L4 Injection technique: LOR saline  Needle:  Needle type: Tuohy  Needle gauge: 17 G Needle length: 9 cm and 9 Needle insertion depth: 8 cm Catheter type: closed end flexible Catheter size: 20 Guage Catheter at skin depth: 12 cm Test dose: negative  Assessment Events: blood not aspirated, injection not painful, no injection resistance, negative IV test and no paresthesia  Additional Notes   Patient tolerated the insertion well without complications.

## 2013-09-24 ENCOUNTER — Other Ambulatory Visit: Payer: Self-pay | Admitting: Obstetrics

## 2013-09-24 LAB — CBC
HCT: 34.2 % — ABNORMAL LOW (ref 36.0–46.0)
HEMOGLOBIN: 11 g/dL — AB (ref 12.0–15.0)
MCH: 27.4 pg (ref 26.0–34.0)
MCHC: 32.2 g/dL (ref 30.0–36.0)
MCV: 85.3 fL (ref 78.0–100.0)
PLATELETS: 317 10*3/uL (ref 150–400)
RBC: 4.01 MIL/uL (ref 3.87–5.11)
RDW: 16.2 % — ABNORMAL HIGH (ref 11.5–15.5)
WBC: 13.2 10*3/uL — ABNORMAL HIGH (ref 4.0–10.5)

## 2013-09-24 NOTE — Lactation Note (Signed)
This note was copied from the chart of Marisa Tierra Thoma. Lactation Consultation Note Follow up consult; baby now 84 hours old; mom unsure if baby has really gotten any breast milk. Mom states baby latches well. Able to hand express only a glisten from the left breast, and a small drop from the right.  Baby awake and crying; assisted to latch baby on the left and baby right back to sleep. Woke baby and latched to the right side, baby sucked for a few sucks then right back to sleep. No audible swallows from either side.  Given that the latch scores reflect minimum to no audible swallows, and given mom's history of low milk supply, I discussed supplementing with mom. Mom agrees to supplement as baby needs.  Reviewed options for supplementation including cup, SNS, and bottle. Mom prefers to supplement with bottle, as that will be easier for her at home with her 31 year old daughter.  Bottle provided, with instructions, and written volume parameters. Baby took 15 ml well and back to sleep. Mom satisfied. Plan is to breastfeed baby; offer formula via bottle if baby is unsatisfied with breastfeeding; post pump.  Mom to call if she has any concerns.   Patient Name: Marisa Humphrey [id number] Date: 09/24/2013 Reason for consult: Follow-up assessment   Maternal Data    Feeding Feeding Type: Breast Milk with Formula added Nipple Type: Slow - flow Length of feed: 15 min  LATCH Score/Interventions Latch: Repeated attempts needed to sustain latch, nipple held in mouth throughout feeding, stimulation needed to elicit sucking reflex.  Audible Swallowing: None  Type of Nipple: Everted at rest and after stimulation  Comfort (Breast/Nipple): Soft / non-tender     Hold (Positioning): No assistance needed to correctly position infant at breast.  LATCH Score: 7  Lactation Tools Discussed/Used     Consult Status Consult Status: Follow-up Follow-up type: In-patient    Guilford Shi  [facility] 09/24/2013, 2:58 PM

## 2013-09-24 NOTE — Progress Notes (Signed)
PPD #1- SVD  Subjective:   Reports feeling well Tolerating po/ No nausea or vomiting Bleeding is light Pain controlled with Motrin and Percocet Up ad lib / ambulatory / voiding without problems Newborn: breastfeeding     Objective:   VS:  VS:  Filed Vitals:   09/23/13 1600 09/23/13 1700 09/23/13 2050 09/24/13 0620  BP: 122/75 115/68 108/64 110/76  Pulse: 89 82 78 86  Temp: 98.9 F (37.2 C) 98.9 F (37.2 C) 99 F (37.2 C) 98.7 F (37.1 C)  TempSrc: Oral Oral Oral Oral  Resp: 18 18 18 18   Height:      Weight:      SpO2: 99% 98%      LABS:  Recent Labs  09/23/13 0755 09/24/13 0545  WBC 13.5* 13.2*  HGB 12.2 11.0*  PLT 313 317   Blood type: --/--/A POS (07/26 0755) Rubella: Immune (01/08 0000)   I&O: Intake/Output     07/26 0701 - 07/27 0700 07/27 0701 - 07/28 0700   Urine (mL/kg/hr) 600 (0.2)    Blood 300 (0.1)    Total Output 900     Net -900          Urine Occurrence 1 x      Physical Exam: Alert and oriented x3 Abdomen: soft, non-tender, non-distended  Fundus: firm, non-tender, U-1 Perineum: Well approximated, no significant erythema, edema, or drainage; healing well. Lochia: small Extremities: No edema, no calf pain or tenderness    Assessment:  PPD # [id number]/ S/P:spontaneous vaginal, 1st degree laceration Depression-stable  Doing well    Plan: Continue routine post partum orders Anticipate D/C home tomorrow   Julianne Handler, N MSN, CNM 09/24/2013, 11:24 AM

## 2013-09-24 NOTE — Progress Notes (Signed)
Clinical Social Work Department PSYCHOSOCIAL ASSESSMENT - MATERNAL/CHILD 09/24/2013  Patient:  Marisa Humphrey, Marisa Humphrey  Account Number:  [id number]  Admit Date:  09/23/2013  Ardine Eng Name:   Preston Fleeting    Clinical Social Worker:  Lucita Ferrara, CLINICAL SOCIAL WORKER   Date/Time:  09/24/2013 09:15 AM  Date Referred:  09/23/2013   Referral source  Physician     Referred reason  Depression/Anxiety   Other referral source:    I:  FAMILY / Jet legal guardian:  PARENT  Guardian - Name Guardian - Age Guardian - Address  Libbey Duce [address] [place], [place] [address]  Chris Solomon  Same as above   Other household support members/support persons Name Relationship DOB   GRAND MOTHER    Other support:   MOB and FOB endorsed numerous family and friends who are supportive.    II  PSYCHOSOCIAL DATA Information Source:  Family Interview  Financial and [employer] Employment:   MOB reported that she works in the [employer] full time, FOB reported that he is also employed full time, works as a Building control surveyor.   Financial resources:  Multimedia programmer [employer] [place]:    School / Grade:   Maternity Care Coordinator / Child Services Coordination / Early Interventions:  Cultural issues impacting care:   None reported    III  STRENGTHS Strengths  Supportive family/friends   Strength comment:    IV  RISK FACTORS AND CURRENT PROBLEMS Current Problem:  YES   Risk Factor & Current Problem Patient Issue Family Issue Risk Factor / Current Problem Comment  Mental Illness N N MOB reported history of depression and anxiety, onset approximately 4 years ago following birth of other child.  MOB has current prescription for Wellbutrin, will fill if needed once she returns home.    V  SOCIAL WORK ASSESSMENT CSW met with MOB and FOB in first floor room, Room 144, to complete assessment due to reported history of anxiety and depression.  MOB was breastfeeding  newborn, Baldo Ash, throughout the assessment, but was receptive to completing the assessment during this time.  MOB provided consent for FOB to be present throughout, MOB primarily participated, but FOB offered supplemental information.  MOB and FOB receptive to consult, agreeable to answering all questions, but were guarded when asked about specific feelings.    MOB and FOB expressed happiness for birth of Baldo Ash, and denied any concerns related to adjustment of having newborn in the home.  MGM lives in their room and will assist with childcare, but MOB discussed that she will be at home for first 3 months due to [facility] benefits.  MOB and FOB discussed that they all basic needs for the newborn.  MOB aware of reason for consult, but comments minimized her statements as she stated that onset of symptoms occurred 4 years ago.  She shared belief that it was related to PPD, but acknowledged that she was prescribed Wellbutrin to help with the symptoms at that time.  She shared that medication was effective, and she stopped medications after 6 months.   Per MOB, no acute symptoms between pregnancies, but shared that she became more irritable and tearful during last 2 months of pregnancy.  She shared that she has a prescription for Wellbutrin, prescribed by her OBGYN, but has not yet filled the prescription.  Per MOB and FOB, symptoms decreased in last 3 weeks prior to birth, following vacation time at the beach. MOB reported being undecided if she wants  to start the medications, and will assess once she returns home.  MOB and FOB denied opposition to medication and denied concerns related to medication, and shared that MOB will begin medications if it impacts MOB's ability to care for herself and her children.  MOB aware of symptoms of PPD, but was receptive to reviewing symptoms of CSW  MOB and FOB agreeable to contacting MD if symptoms are exhibited.   CSW offered referral for psychiatrist to monitor Wellbutrin.   MOB and FOB declined offer, shared that they will request from OBGYN if needs arise in the future.   MOB and FOB aware of CSW availability while in hospital, and agreeable to reaching out if questions or concerns arise.   No barriers to discharge.   VI SOCIAL WORK PLAN Social Work Plan  Patient/Family Education   Type of pt/family education:   PPD and anxiety   If child protective services report - county:   If child protective services report - date:   Information/referral to community resources comment:   MOB and FOB were offered referral for psychiatrist and therapist since MOB reported that OBGYN is prescribing medications. They declined request, shared that they will request referral from OBGYN if needed in the future.   Other social work plan:   None. No barriers to discharge.

## 2013-09-24 NOTE — Anesthesia Postprocedure Evaluation (Signed)
Anesthesia Post Note  Patient: Marisa Humphrey  Procedure(s) Performed: * No procedures listed *  Anesthesia type: Epidural  Patient location: Mother/Baby  Post pain: Pain level controlled  Post assessment: Post-op Vital signs reviewed  Last Vitals:  Filed Vitals:   09/24/13 0620  BP: 110/76  Pulse: 86  Temp: 37.1 C  Resp: 18    Post vital signs: Reviewed  Level of consciousness:alert  Complications: No apparent anesthesia complications

## 2013-09-25 ENCOUNTER — Ambulatory Visit: Payer: Self-pay

## 2013-09-25 ENCOUNTER — Inpatient Hospital Stay (HOSPITAL_COMMUNITY): Admission: RE | Admit: 2013-09-25 | Payer: 59 | Source: Ambulatory Visit

## 2013-09-25 MED ORDER — IBUPROFEN 600 MG PO TABS: 600.0000 mg | ORAL_TABLET | Freq: Four times a day (QID) | ORAL | Status: DC

## 2013-09-25 NOTE — Progress Notes (Signed)
UR chart review completed.  

## 2013-09-25 NOTE — Progress Notes (Signed)
PPD #2 - SVD  Subjective:   Reports feeling well Tolerating po/ No nausea or vomiting Bleeding is light Pain controlled with Motrin and Percocet Up ad lib / ambulatory / voiding without problems Newborn: breastfeeding     Objective:   VS:  VS:  Filed Vitals:   09/23/13 2050 09/24/13 0620 09/24/13 1745 09/25/13 0615  BP: 108/64 110/76 116/66 112/75  Pulse: 78 86 74 68  Temp: 99 F (37.2 C) 98.7 F (37.1 C) 98.4 F (36.9 C) 98 F (36.7 C)  TempSrc: Oral Oral Oral Oral  Resp: 18 18 18 20   Height:      Weight:      SpO2:        LABS:   Recent Labs  09/23/13 0755 09/24/13 0545  WBC 13.5* 13.2*  HGB 12.2 11.0*  PLT 313 317   Blood type: --/--/A POS (07/26 0755) Rubella: Immune (01/08 0000)   I&O: Intake/Output     07/27 0701 - 07/28 0700 07/28 0701 - 07/29 0700   Urine (mL/kg/hr)     Blood     Total Output       Net              Physical Exam: Alert and oriented x3 Abdomen: soft, non-tender, non-distended  Fundus: firm, non-tender, U-1 Perineum: Well approximated, no significant erythema, edema, or drainage; healing well. Lochia: small Extremities: No edema, no calf pain or tenderness    Assessment:  PPD # 2 / [id number] / S/P:spontaneous vaginal, 1st degree laceration Depression-stable   Doing well   Plan: Continue routine post partum orders D/C home today   Laury Deep, M MSN, CNM 09/25/2013, 8:18 AM

## 2013-09-25 NOTE — Discharge Summary (Signed)
Reviewed and agree with note and plan. V.Dilia Alemany, MD  

## 2013-09-25 NOTE — Discharge Instructions (Signed)
Breast Pumping Tips °If you are breastfeeding, there may be times when you cannot feed your baby directly. Returning to work or going on a trip are common examples. Pumping allows you to store breast milk and feed it to your baby later.  °You may not get much milk when you first start to pump. Your breasts should start to make more after a few days. If you pump at the times you usually feed your baby, you may be able to keep making enough milk to feed your baby without also using formula. The more often you pump, the more milk you will produce.  °WHEN SHOULD I PUMP?  °· You can begin to pump soon after delivery. However, some experts recommend waiting about 4 weeks before giving your infant a bottle to make sure breastfeeding is going well.  °· If you plan to return to work, begin pumping a few weeks before. This will help you develop techniques that work best for you. It also lets you build up a supply of breast milk.   °· When you are with your infant, feed on demand and pump after each feeding.   °· When you are away from your infant for several hours, pump for about 15 minutes every 2-3 hours. Pump both breasts at the same time if you can.   °· If your infant has a formula feeding, make sure to pump around the same time.     °· If you drink any alcohol, wait 2 hours before pumping.   °HOW DO I PREPARE TO PUMP? °Your let-down reflex is the natural reaction to stimulation that makes your breast milk flow. It is easier to stimulate this reflex when you are relaxed. Find relaxation techniques that work for you. If you have difficulty with your let-down reflex, try these methods:  °· Smell one of your infant's blankets or an item of clothing.   °· Look at a picture or video of your infant.   °· Sit in a quiet, private space.   °· Massage the breast you plan to pump.   °· Place soothing warmth on the breast.   °· Play relaxing music.   °WHAT ARE SOME GENERAL BREAST PUMPING TIPS? °· Wash your hands before you pump. You  do not need to wash your nipples or breasts. °· There are three ways to pump. °¨ You can use your hand to massage and compress your breast. °¨ You can use a handheld manual pump. °¨ You can use an electric pump.   °· Make sure the suction cup (flange) on the breast pump is the right size. Place the flange directly over the nipple. If it is the wrong size or placed the wrong way, it may be painful and cause nipple damage.   °· If pumping is uncomfortable, apply a small amount of purified or modified lanolin to your nipple and areola. °· If you are using an electric pump, adjust the speed and suction power to be more comfortable. °· If pumping is painful or if you are not getting very much milk, you may need a different type of pump. A lactation consultant can help you determine what type of pump to use.   °· Keep a full water bottle near you at all times. Drinking lots of fluid helps you make more milk.  °· You can store your milk to use later. Pumped breast milk can be stored in a sealable, sterile container or plastic bag. Label all stored breast milk with the date you pumped it. °¨ Milk can stay out at room temperature for up to 8 hours. °¨   You can store your milk in the refrigerator for up to 8 days. °¨ You can store your milk in the freezer for 3 months. Thaw frozen milk using warm water. Do not put it in the microwave. °· Do not smoke. Smoking can lower your milk supply and harm your infant. If you need help quitting, ask your health care provider to recommend a program.   °WHEN SHOULD I CALL MY HEALTH CARE PROVIDER OR A LACTATION CONSULTANT? °· You are having trouble pumping. °· You are concerned that you are not making enough milk. °· You have nipple pain, soreness, or redness. °· You want to use birth control. Birth control pills may lower your milk supply. Talk to your health care provider about your options. °Document Released: 08/05/2009 Document Revised: 02/20/2013 Document Reviewed:  12/08/2012 °Exit[employer]® Patient Information ©2015 [employer]. This information is not intended to replace advice given to you by your health care provider. Make sure you discuss any questions you have with your health care provider. ° °Nutrition for the New Mother  °A new mother needs good health and nutrition so she can have energy to take care of a new baby. Whether a mother breastfeeds or formula feeds the baby, it is important to have a well-balanced diet. Foods from all the food groups should be chosen to meet the new mother's energy needs and to give her the nutrients needed for repair and healing.  °A HEALTHY EATING PLAN °The My [employer] plan for Moms outlines what you should eat to help you and your baby stay healthy. The energy and amount of food you need depends on whether or not you are breastfeeding. If you are breastfeeding you will need more nutrients. If you choose not to breastfeed, your nutrition goal should be to return to a healthy weight. Limiting calories may be needed if you are not breastfeeding.  °HOME CARE INSTRUCTIONS  °· For a personal plan based on your unique needs, see your Registered Dietitian or visit www.mypyramid.gov. °· Eat a variety of foods. The plan below will help guide you. The following chart has a suggested daily meal plan from the [employer] for Moms. °· Eat a variety of fruits and vegetables. °· Eat more dark green and orange vegetables and cooked dried beans. °· Make half your grains whole grains. Choose whole instead of refined grains. °· Choose low-fat or lean meats and poultry. °· Choose low-fat or fat-free dairy products like milk, cheese, or yogurt. °Fruits °· Breastfeeding: 2 cups °· Non-Breastfeeding: 2 cups °· What Counts as a serving? °¨ 1 cup of fruit or juice. °¨ ½ cup dried fruit. °Vegetables °· Breastfeeding: 3 cups °· Non-Breastfeeding: 2 ½ cups °· What Counts as a serving? °¨ 1 cup raw or cooked vegetables. °¨ Juice or 2 cups raw leafy  vegetables. °Grains °· Breastfeeding: 8 oz °· Non-Breastfeeding: 6 oz °· What Counts as a serving? °¨ 1 slice bread. °¨ 1 oz ready-to-eat cereal. °¨ ½ cup cooked pasta, rice, or cereal. °Meat and Beans °· Breastfeeding: 6 ½ oz °· Non-Breastfeeding: 5 ½ oz °· What Counts as a serving? °¨ 1 oz lean meat, poultry, or fish °¨ ¼ cup cooked dry beans °¨ ½ oz nuts or 1 egg °¨ 1 tbs peanut butter °Milk °· Breastfeeding: 3 cups °· Non-Breastfeeding: 3 cups °· What Counts as a serving? °¨ 1 cup milk. °¨ 8 oz yogurt. °¨ 1 ½ oz cheese. °¨ 2 oz processed cheese. °TIPS FOR THE BREASTFEEDING MOM °· Rapid weight   loss is not suggested when you are breastfeeding. By simply breastfeeding, you will be able to lose the weight gained during your pregnancy. Your caregiver can keep track of your weight and tell you if your weight loss is appropriate. °· Be sure to drink fluids. You may notice that you are thirstier than usual. A suggestion is to drink a glass of water or other beverage whenever you breastfeed. °· Avoid alcohol as it can be passed into your breast milk. °· Limit caffeine drinks to no more than 2 to 3 cups per day. °· You may need to keep taking your prenatal vitamin while you are breastfeeding. Talk with your caregiver about taking a vitamin or supplement. °RETURING TO A HEALTHY WEIGHT °· The My [employer] Plan for Moms will help you return to a healthy weight. It will also provide the nutrients you need. °· You may need to limit "empty" calories. These include: °¨ High fat foods like fried foods, fatty meats, fast food, butter, and mayonnaise. °¨ High sugar foods like sodas, jelly, candy, and sweets. °· Be physically active. Include 30 minutes of exercise or more each day. Choose an activity you like such as walking, swimming, biking, or aerobics. Check with your caregiver before you start to exercise. °Document Released: 05/25/2007 Document Revised: 05/10/2011 Document Reviewed: 05/25/2007 °ExitCare® Patient Information  ©2015 [employer]. This information is not intended to replace advice given to you by your health care provider. Make sure you discuss any questions you have with your health care provider. °Postpartum Depression and Baby Blues °The postpartum period begins right after the birth of a baby. During this time, there is often a great amount of joy and excitement. It is also a time of many changes in the life of the parents. Regardless of how many times a mother gives birth, each child brings new challenges and dynamics to the family. It is not unusual to have feelings of excitement along with confusing shifts in moods, emotions, and thoughts. All mothers are at risk of developing postpartum depression or the "baby blues." These mood changes can occur right after giving birth, or they may occur many months after giving birth. The baby blues or postpartum depression can be mild or severe. Additionally, postpartum depression can go away rather quickly, or it can be a long-term condition.  °CAUSES °Raised hormone levels and the rapid drop in those levels are thought to be a main cause of postpartum depression and the baby blues. A number of hormones change during and after pregnancy. Estrogen and progesterone usually decrease right after the delivery of your baby. The levels of thyroid hormone and various cortisol steroids also rapidly drop. Other factors that play a role in these mood changes include major life events and genetics.  °RISK FACTORS °If you have any of the following risks for the baby blues or postpartum depression, know what symptoms to watch out for during the postpartum period. Risk factors that may increase the likelihood of getting the baby blues or postpartum depression include: °· Having a personal or family history of depression.   °· Having depression while being pregnant.   °· Having premenstrual mood issues or mood issues related to oral contraceptives. °· Having a lot of life stress.   °· Having  marital conflict.   °· Lacking a social support network.   °· Having a baby with special needs.   °· Having health problems, such as diabetes.   °SIGNS AND SYMPTOMS °Symptoms of baby blues include: °· Brief changes in mood, such as going   from extreme happiness to sadness. °· Decreased concentration.   °· Difficulty sleeping.   °· Crying spells, tearfulness.   °· Irritability.   °· Anxiety.   °Symptoms of postpartum depression typically begin within the first month after giving birth. These symptoms include: °· Difficulty sleeping or excessive sleepiness.   °· Marked weight loss.   °· Agitation.   °· Feelings of worthlessness.   °· Lack of interest in activity or food.   °Postpartum psychosis is a very serious condition and can be dangerous. Fortunately, it is rare. Displaying any of the following symptoms is cause for immediate medical attention. Symptoms of postpartum psychosis include:  °· Hallucinations and delusions.   °· Bizarre or disorganized behavior.   °· Confusion or disorientation.   °DIAGNOSIS  °A diagnosis is made by an evaluation of your symptoms. There are no medical or lab tests that lead to a diagnosis, but there are various questionnaires that a health care provider may use to identify those with the baby blues, postpartum depression, or psychosis. Often, a screening tool called the Edinburgh Postnatal Depression Scale is used to diagnose depression in the postpartum period.  °TREATMENT °The baby blues usually goes away on its own in 1-2 weeks. Social support is often all that is needed. You will be encouraged to get adequate sleep and rest. Occasionally, you may be given medicines to help you sleep.  °Postpartum depression requires treatment because it can last several months or longer if it is not treated. Treatment may include individual or group therapy, medicine, or both to address any social, physiological, and psychological factors that may play a role in the depression. Regular exercise, a  healthy diet, rest, and social support may also be strongly recommended.  °Postpartum psychosis is more serious and needs treatment right away. Hospitalization is often needed. °HOME CARE INSTRUCTIONS °· Get as much rest as you can. Nap when the baby sleeps.   °· Exercise regularly. Some women find yoga and walking to be beneficial.   °· Eat a balanced and nourishing diet.   °· Do little things that you enjoy. Have a cup of tea, take a bubble bath, read your favorite magazine, or listen to your favorite music. °· Avoid alcohol.   °· Ask for help with household chores, cooking, grocery shopping, or running errands as needed. Do not try to do everything.   °· Talk to people close to you about how you are feeling. Get support from your partner, family members, friends, or other new moms. °· Try to stay positive in how you think. Think about the things you are grateful for.   °· Do not spend a lot of time alone.   °· Only take over-the-counter or prescription medicine as directed by your health care provider. °· Keep all your postpartum appointments.   °· Let your health care provider know if you have any concerns.   °SEEK MEDICAL CARE IF: °You are having a reaction to or problems with your medicine. °SEEK IMMEDIATE MEDICAL CARE IF: °· You have suicidal feelings.   °· You think you may harm the baby or someone else. °MAKE SURE YOU: °· Understand these instructions. °· Will watch your condition. °· Will get help right away if you are not doing well or get worse. °Document Released: 11/20/2003 Document Revised: 02/20/2013 Document Reviewed: 11/27/2012 °Exit[employer]® Patient Information ©2015 [employer]. This information is not intended to replace advice given to you by your health care provider. Make sure you discuss any questions you have with your health care provider. °Breastfeeding and Mastitis °Mastitis is inflammation of the breast tissue. It can occur in women who   are breastfeeding. This can make breastfeeding  painful. Mastitis will sometimes go away on its own. Your health care provider will help determine if treatment is needed. °CAUSES °Mastitis is often associated with a blocked milk (lactiferous) duct. This can happen when too much milk builds up in the breast. Causes of excess milk in the breast can include: °· Poor latch-on. If your baby is not latched onto the breast properly, she or he may not empty your breast completely while breastfeeding. °· Allowing too much time to pass between feedings. °· Wearing a bra or other clothing that is too tight. This puts extra pressure on the lactiferous ducts so milk does not flow through them as it should. °Mastitis can also be caused by a bacterial infection. Bacteria may enter the breast tissue through cuts or openings in the skin. In women who are breastfeeding, this may occur because of cracked or irritated skin. Cracks in the skin are often caused when your baby does not latch on properly to the breast. °SIGNS AND SYMPTOMS °· Swelling, redness, tenderness, and pain in an area of the breast. °· Swelling of the glands under the arm on the same side. °· Fever may or may not accompany mastitis. °If an infection is allowed to progress, a collection of pus (abscess) may develop. °DIAGNOSIS  °Your health care provider can usually diagnose mastitis based on your symptoms and a physical exam. Tests may be done to help confirm the diagnosis. These may include: °· Removal of pus from the breast by applying pressure to the area. This pus can be examined in the lab to determine which bacteria are present. If an abscess has developed, the fluid in the abscess can be removed with a needle. This can also be used to confirm the diagnosis and determine the bacteria present. In most cases, pus will not be present. °· Blood tests to determine if your body is fighting a bacterial infection. °· Mammogram or ultrasound tests to rule out other problems or diseases. °TREATMENT  °Mastitis that  occurs with breastfeeding will sometimes go away on its own. Your health care provider may choose to wait 24 hours after first seeing you to decide whether a prescription medicine is needed. If your symptoms are worse after 24 hours, your health care provider will likely prescribe an antibiotic medicine to treat the mastitis. He or she will determine which bacteria are most likely causing the infection and will then select an appropriate antibiotic medicine. This is sometimes changed based on the results of tests performed to identify the bacteria, or if there is no response to the antibiotic medicine selected. Antibiotic medicines are usually given by mouth. You may also be given medicine for pain. °HOME CARE INSTRUCTIONS °· Only take over-the-counter or prescription medicines for pain, fever, or discomfort as directed by your health care provider. °· If your health care provider prescribed an antibiotic medicine, take the medicine as directed. Make sure you finish it even if you start to feel better. °· Do not wear a tight or underwire bra. Wear a soft, supportive bra. °· Increase your fluid intake, especially if you have a fever. °· Continue to empty the breast. Your health care provider can tell you whether this milk is safe for your infant or needs to be thrown out. You may be told to stop nursing until your health care provider thinks it is safe for your baby. Use a breast pump if you are advised to stop nursing. °· Keep your nipples   clean and dry. °· Empty the first breast completely before going to the other breast. If your baby is not emptying your breasts completely for some reason, use a breast pump to empty your breasts. °· If you go back to work, pump your breasts while at work to stay in time with your nursing schedule. °· Avoid allowing your breasts to become overly filled with milk (engorged). °SEEK MEDICAL CARE IF: °· You have pus-like discharge from the breast. °· Your symptoms do not improve with  the treatment prescribed by your health care provider within 2 days. °SEEK IMMEDIATE MEDICAL CARE IF: °· Your pain and swelling are getting worse. °· You have pain that is not controlled with medicine. °· You have a red line extending from the breast toward your armpit. °· You have a fever or persistent symptoms for more than 2-3 days. °· You have a fever and your symptoms suddenly get worse. °MAKE SURE YOU:  °· Understand these instructions. °· Will watch your condition. °· Will get help right away if you are not doing well or get worse. °Document Released: 06/12/2004 Document Revised: 02/20/2013 Document Reviewed: 09/21/2012 °[employer]® Patient Information ©2015 [employer]. This information is not intended to replace advice given to you by your health care provider. Make sure you discuss any questions you have with your health care provider. °Breastfeeding °Deciding to breastfeed is one of the best choices you can make for you and your baby. A change in hormones during pregnancy causes your breast tissue to grow and increases the number and size of your milk ducts. These hormones also allow proteins, sugars, and fats from your blood supply to make breast milk in your milk-producing glands. Hormones prevent breast milk from being released before your baby is born as well as prompt milk flow after birth. Once breastfeeding has begun, thoughts of your baby, as well as his or her sucking or crying, can stimulate the release of milk from your milk-producing glands.  °BENEFITS OF BREASTFEEDING °For Your Baby °· Your first milk (colostrum) helps your baby's digestive system function better.   °· There are antibodies in your milk that help your baby fight off infections.   °· Your baby has a lower incidence of asthma, allergies, and sudden infant death syndrome.   °· The nutrients in breast milk are better for your baby than infant formulas and are designed uniquely for your baby's needs.   °· Breast milk improves your  baby's brain development.   °· Your baby is less likely to develop other conditions, such as childhood obesity, asthma, or type 2 diabetes mellitus.   °For You  °· Breastfeeding helps to create a very special bond between you and your baby.   °· Breastfeeding is convenient. Breast milk is always available at the correct temperature and costs nothing.   °· Breastfeeding helps to burn calories and helps you lose the weight gained during pregnancy.   °· Breastfeeding makes your uterus contract to its prepregnancy size faster and slows bleeding (lochia) after you give birth.   °· Breastfeeding helps to lower your risk of developing type 2 diabetes mellitus, osteoporosis, and breast or ovarian cancer later in life. °SIGNS THAT YOUR BABY IS HUNGRY °Early Signs of Hunger  °· Increased alertness or activity. °· Stretching. °· Movement of the head from side to side. °· Movement of the head and opening of the mouth when the corner of the mouth or cheek is stroked (rooting). °· Increased sucking sounds, smacking lips, cooing, sighing, or squeaking. °· Hand-to-mouth movements. °· Increased sucking of   fingers or hands. °Late Signs of Hunger °· Fussing. °· Intermittent crying. °Extreme Signs of Hunger °Signs of extreme hunger will require calming and consoling before your baby will be able to breastfeed successfully. Do not wait for the following signs of extreme hunger to occur before you initiate breastfeeding:   °· Restlessness. °· A loud, strong cry. °·  Screaming. °BREASTFEEDING BASICS °Breastfeeding Initiation °· Find a comfortable place to sit or lie down, with your neck and back well supported. °· Place a pillow or rolled up blanket under your baby to bring him or her to the level of your breast (if you are seated). Nursing pillows are specially designed to help support your arms and your baby while you breastfeed. °· Make sure that your baby's abdomen is facing your abdomen.   °· Gently massage your breast. With your  fingertips, massage from your chest wall toward your nipple in a circular motion. This encourages milk flow. You may need to continue this action during the feeding if your milk flows slowly. °· Support your breast with 4 fingers underneath and your thumb above your nipple. Make sure your fingers are well away from your nipple and your baby's mouth.   °· Stroke your baby's lips gently with your finger or nipple.   °· When your baby's mouth is open wide enough, quickly bring your baby to your breast, placing your entire nipple and as much of the colored area around your nipple (areola) as possible into your baby's mouth.   °¨ More areola should be visible above your baby's upper lip than below the lower lip.   °¨ Your baby's tongue should be between his or her lower gum and your breast.   °· Ensure that your baby's mouth is correctly positioned around your nipple (latched). Your baby's lips should create a seal on your breast and be turned out (everted). °· It is common for your baby to suck about 2-3 minutes in order to start the flow of breast milk. °Latching °Teaching your baby how to latch on to your breast properly is very important. An improper latch can cause nipple pain and decreased milk supply for you and poor weight gain in your baby. Also, if your baby is not latched onto your nipple properly, he or she may swallow some air during feeding. This can make your baby fussy. Burping your baby when you switch breasts during the feeding can help to get rid of the air. However, teaching your baby to latch on properly is still the best way to prevent fussiness from swallowing air while breastfeeding. °Signs that your baby has successfully latched on to your nipple:    °· Silent tugging or silent sucking, without causing you pain.   °· Swallowing heard between every 3-4 sucks.   °·  Muscle movement above and in front of his or her ears while sucking.   °Signs that your baby has not successfully latched on to  nipple:  °· Sucking sounds or smacking sounds from your baby while breastfeeding. °· Nipple pain. °If you think your baby has not latched on correctly, slip your finger into the corner of your baby's mouth to break the suction and place it between your baby's gums. Attempt breastfeeding initiation again. °Signs of Successful Breastfeeding °Signs from your baby:   °· A gradual decrease in the number of sucks or complete cessation of sucking.   °· Falling asleep.   °· Relaxation of his or her body.   °· Retention of a small amount of milk in his or her mouth.   °· Letting go   of your breast by himself or herself. °Signs from you: °· Breasts that have increased in firmness, weight, and size 1-3 hours after feeding.   °· Breasts that are softer immediately after breastfeeding. °· Increased milk volume, as well as a change in milk consistency and color by the fifth day of breastfeeding.   °· Nipples that are not sore, cracked, or bleeding. °Signs That Your Baby is Getting Enough Milk °· Wetting at least 3 diapers in a 24-hour period. The urine should be clear and pale yellow by age 5 days. °· At least 3 stools in a 24-hour period by age 5 days. The stool should be soft and yellow. °· At least 3 stools in a 24-hour period by age 7 days. The stool should be seedy and yellow. °· No loss of weight greater than 10% of birth weight during the first 3 days of age. °· Average weight gain of 4-7 ounces (113-198 g) per week after age 4 days. °· Consistent daily weight gain by age 5 days, without weight loss after the age of 2 weeks. °After a feeding, your baby may spit up a small amount. This is common. °BREASTFEEDING FREQUENCY AND DURATION °Frequent feeding will help you make more milk and can prevent sore nipples and breast engorgement. Breastfeed when you feel the need to reduce the fullness of your breasts or when your baby shows signs of hunger. This is called "breastfeeding on demand." Avoid introducing a pacifier to your  baby while you are working to establish breastfeeding (the first 4-6 weeks after your baby is born). After this time you may choose to use a pacifier. Research has shown that pacifier use during the first year of a baby's life decreases the risk of sudden infant death syndrome (SIDS). °Allow your baby to feed on each breast as long as he or she wants. Breastfeed until your baby is finished feeding. When your baby unlatches or falls asleep while feeding from the first breast, offer the second breast. Because newborns are often sleepy in the first few weeks of life, you may need to awaken your baby to get him or her to feed. °Breastfeeding times will vary from baby to baby. However, the following rules can serve as a guide to help you ensure that your baby is properly fed: °· Newborns (babies 4 weeks of age or younger) may breastfeed every 1-3 hours. °· Newborns should not go longer than 3 hours during the day or 5 hours during the night without breastfeeding. °· You should breastfeed your baby a minimum of 8 times in a 24-hour period until you begin to introduce solid foods to your baby at around 6 months of age. °BREAST MILK PUMPING °Pumping and storing breast milk allows you to ensure that your baby is exclusively fed your breast milk, even at times when you are unable to breastfeed. This is especially important if you are going back to work while you are still breastfeeding or when you are not able to be present during feedings. Your lactation consultant can give you guidelines on how long it is safe to store breast milk.  °A breast pump is a machine that allows you to pump milk from your breast into a sterile bottle. The pumped breast milk can then be stored in a refrigerator or freezer. Some breast pumps are operated by hand, while others use electricity. Ask your lactation consultant which type will work best for you. Breast pumps can be purchased, but some hospitals and breastfeeding support groups   lease  breast pumps on a monthly basis. A lactation consultant can teach you how to hand express breast milk, if you prefer not to use a pump.  °CARING FOR YOUR BREASTS WHILE YOU BREASTFEED °Nipples can become dry, cracked, and sore while breastfeeding. The following recommendations can help keep your breasts moisturized and healthy: °· Avoid using soap on your nipples.   °· Wear a supportive bra. Although not required, special nursing bras and tank tops are designed to allow access to your breasts for breastfeeding without taking off your entire bra or top. Avoid wearing underwire-style bras or extremely tight bras. °· Air dry your nipples for 3-4 minutes after each feeding.   °· Use only cotton bra pads to absorb leaked breast milk. Leaking of breast milk between feedings is normal.   °· Use lanolin on your nipples after breastfeeding. Lanolin helps to maintain your skin's normal moisture barrier. If you use pure lanolin, you do not need to wash it off before feeding your baby again. Pure lanolin is not toxic to your baby. You may also hand express a few drops of breast milk and gently massage that milk into your nipples and allow the milk to air dry. °In the first few weeks after giving birth, some women experience extremely full breasts (engorgement). Engorgement can make your breasts feel heavy, warm, and tender to the touch. Engorgement peaks within 3-5 days after you give birth. The following recommendations can help ease engorgement: °· Completely empty your breasts while breastfeeding or pumping. You may want to start by applying warm, moist heat (in the shower or with warm water-soaked hand towels) just before feeding or pumping. This increases circulation and helps the milk flow. If your baby does not completely empty your breasts while breastfeeding, pump any extra milk after he or she is finished. °· Wear a snug bra (nursing or regular) or tank top for 1-2 days to signal your body to slightly decrease milk  production. °· Apply ice packs to your breasts, unless this is too uncomfortable for you. °· Make sure that your baby is latched on and positioned properly while breastfeeding. °If engorgement persists after 48 hours of following these recommendations, contact your health care provider or a lactation consultant. °OVERALL HEALTH CARE RECOMMENDATIONS WHILE BREASTFEEDING °· Eat healthy foods. Alternate between meals and snacks, eating 3 of each per day. Because what you eat affects your breast milk, some of the foods may make your baby more irritable than usual. Avoid eating these foods if you are sure that they are negatively affecting your baby. °· Drink milk, fruit juice, and water to satisfy your thirst (about 10 glasses a day).   °· Rest often, relax, and continue to take your prenatal vitamins to prevent fatigue, stress, and anemia. °· Continue breast self-awareness checks. °· Avoid chewing and smoking tobacco. °· Avoid alcohol and drug use. °Some medicines that may be harmful to your baby can pass through breast milk. It is important to ask your health care provider before taking any medicine, including all over-the-counter and prescription medicine as well as vitamin and herbal supplements. °It is possible to become pregnant while breastfeeding. If birth control is desired, ask your health care provider about options that will be safe for your baby. °SEEK MEDICAL CARE IF:  °· You feel like you want to stop breastfeeding or have become frustrated with breastfeeding. °· You have painful breasts or nipples. °· Your nipples are cracked or bleeding. °· Your breasts are red, tender, or warm. °· You have   a swollen area on either breast. °· You have a fever or chills. °· You have nausea or vomiting. °· You have drainage other than breast milk from your nipples. °· Your breasts do not become full before feedings by the fifth day after you give birth. °· You feel sad and depressed. °· Your baby is too sleepy to eat  well. °· Your baby is having trouble sleeping.   °· Your baby is wetting less than 3 diapers in a 24-hour period. °· Your baby has less than 3 stools in a 24-hour period. °· Your baby's skin or the white part of his or her eyes becomes yellow.   °· Your baby is not gaining weight by 5 days of age. °SEEK IMMEDIATE MEDICAL CARE IF:  °· Your baby is overly tired (lethargic) and does not want to wake up and feed. °· Your baby develops an unexplained fever. °Document Released: 02/15/2005 Document Revised: 02/20/2013 Document Reviewed: 08/09/2012 °[employer]® Patient Information ©2015 [employer]. This information is not intended to replace advice given to you by your health care provider. Make sure you discuss any questions you have with your health care provider. ° °

## 2013-09-25 NOTE — Discharge Summary (Signed)
OBSTETRICAL DISCHARGE SUMMARY   Patient ID: Marisa Humphrey MRN: [id number] DOB/AGE: May 08, 1980 33 y.o.  Admit date: 09/23/2013 Admission Diagnoses: Active Labor    Discharge date:  09/25/2013 Discharge Diagnoses: S/P Spontaneous Vaginal Delivery  Reason for Admission: onset of labor  Prenatal history: [id number]   EDC : 09/23/2013, by Ultrasound  Prenatal care at [facility] since 11.[redacted] weeks gestation Primary provider : Dr. Pamala Hurry Prenatal course complicated by Obesity, Lap band  Prenatal Labs: ABO, Rh: --/--/A POS (07/26 0755)  Antibody: NEG (07/26 0755) Rubella:  Immune RPR: NON REAC (07/26 0755)  HBsAg: Negative (01/08 0000)  HIV: Non-reactive (01/08 0000)  GBS: Negative (07/08 0000)   Labor Summary: Admitted in active labor / rapid progression to complete dilation / SVD of viable female infant by Dr. Benjie Karvonen / 1st degree perineal laceration repaired by Dr. Benjie Karvonen / no immediate postpartum complications  Anesthesia: epidural Procedures: repair of 1st degree laceration Complications: non  Newborn Data:  Gender: female Feeding method : breast Circumcision: N/A Weight: 8 lbs 12.2 oz Apgar: 8/9  Discharge Information: Condition: stable Activity: pelvic rest Diet: routine Medications: PNV and Ibuprofen   Instructions: [facility] / instructions reviewed Discharge to: home Follow up : [facility] at 6 weeks postpartum  Signed: Graceann Congress CNM, MSN 09/25/2013, 9:00 AM

## 2013-09-25 NOTE — Lactation Note (Signed)
This note was copied from the chart of Girl Saraiah Bhat. Lactation Consultation Note  Reviewed hand expression.  Mother has very small drops of colostrum.  Attempted breastfeeding but infant sleepy, breastfed 2 hours ago. Mother has wide spaced breasts.  Left breast, small amount glandular tissue.  History of low milk supply. Mothers nippled are sore.  Provided mother with comfort gels. Reviewed keeping compression on breasts during feeding. Mother has breast pump and suggest she post pump 4 times a day for 15-20 to stimulate her milk supply. Mother is supplementing with formula after feedings.  Provided volume guidelines. Reviewed engorgement care and monitoring voids/stools.      Patient Name: Girl Ajeenah Heiny [facility] Date: 09/25/2013 Reason for consult: Follow-up assessment   Maternal Data    Feeding Feeding Type: Breast Fed Length of feed: 10 min  LATCH Score/Interventions Latch: Grasps breast easily, tongue down, lips flanged, rhythmical sucking.  Audible Swallowing: A few with stimulation  Type of Nipple: Everted at rest and after stimulation  Comfort (Breast/Nipple): Filling, red/small blisters or bruises, mild/mod discomfort  Problem noted: Cracked, bleeding, blisters, bruises (rt)  Hold (Positioning): No assistance needed to correctly position infant at breast.  LATCH Score: 8  Lactation Tools Discussed/Used     Consult Status Consult Status: Complete    Carlye Grippe 09/25/2013, 11:58 AM

## 2013-11-07 ENCOUNTER — Other Ambulatory Visit: Payer: 59

## 2013-11-12 ENCOUNTER — Encounter: Payer: Self-pay | Admitting: Family Medicine

## 2013-11-12 ENCOUNTER — Ambulatory Visit (INDEPENDENT_AMBULATORY_CARE_PROVIDER_SITE_OTHER): Payer: 59 | Admitting: Family Medicine

## 2013-11-12 VITALS — BP 110/74 | HR 78 | Temp 98.5°F | Ht 67.5 in | Wt 300.9 lb

## 2013-11-12 DIAGNOSIS — Z8249 Family history of ischemic heart disease and other diseases of the circulatory system: Secondary | ICD-10-CM

## 2013-11-12 DIAGNOSIS — F988 Other specified behavioral and emotional disorders with onset usually occurring in childhood and adolescence: Secondary | ICD-10-CM

## 2013-11-12 DIAGNOSIS — Z0000 Encounter for general adult medical examination without abnormal findings: Secondary | ICD-10-CM

## 2013-11-12 MED ORDER — AMPHETAMINE-DEXTROAMPHET ER 20 MG PO CP24: 20.0000 mg | ORAL_CAPSULE | ORAL | Status: DC

## 2013-11-12 NOTE — Progress Notes (Signed)
Subjective:     Marisa Humphrey is a 33 y.o. female and is here for a comprehensive physical exam. The patient reports no problems.  History   Social History  . Marital Status: Married    Spouse Name: N/A    Number of Children: 1  . Years of Education: N/A   Occupational History  . nurse [employer] History Main Topics  . Smoking status: Former Smoker -- 0.50 packs/day for 16 years    Types: Cigarettes  . Smokeless tobacco: Never Used  . Alcohol Use: No  . Drug Use: No  . Sexual Activity: Yes    Partners: Male   Other Topics Concern  . Not on file   Social History Narrative   Exercising --no   Health Maintenance  Topic Date Due  . Influenza Vaccine  11/13/2014 (Originally 09/29/2013)  . Pap Smear  11/07/2016  . Tetanus/tdap  07/18/2023    The following portions of the patient's history were reviewed and updated as appropriate:  She  has a past medical history of Depression; GERD (gastroesophageal reflux disease); Anxiety; History of condyloma acuminatum; varicella; External hemorrhoids without mention of complication; and Postpartum care following vaginal delivery (7/26) (09/23/2013). She  does not have any pertinent problems on file. She  has past surgical history that includes Laparoscopic gastric banding (11/09/2005) and Mole removal. Her family history includes Cancer in her other; Diabetes in her maternal grandmother and mother; Heart attack (age of onset: 59) in her father; Heart disease (age of onset: 49) in her father; Multiple sclerosis in her brother. She  reports that she has quit smoking. Her smoking use included Cigarettes. She has a 8 pack-year smoking history. She has never used smokeless tobacco. She reports that she does not drink alcohol or use illicit drugs. She has a current medication list which includes the following prescription(s): acetaminophen, cholecalciferol, etonogestrel-ethinyl estradiol, and  ibuprofen. Current Outpatient Prescriptions on File Prior to Visit  Medication Sig Dispense Refill  . acetaminophen (TYLENOL) 500 MG tablet Take 1,000 mg by mouth every 6 (six) hours as needed for mild pain or headache.       . cholecalciferol (VITAMIN D) 1000 UNITS tablet Take 2,000 Units by mouth daily.      Marland Kitchen ibuprofen (ADVIL,MOTRIN) 600 MG tablet Take 1 tablet (600 mg total) by mouth every 6 (six) hours.  30 tablet  0   No current facility-administered medications on file prior to visit.   She is allergic to penicillins..  Review of Systems Review of Systems  Constitutional: Negative for activity change, appetite change and fatigue.  HENT: Negative for hearing loss, congestion, tinnitus and ear discharge.  dentist q23m Eyes: Negative for visual disturbance (see optho q1y -- vision corrected to 20/20 with glasses).  Respiratory: Negative for cough, chest tightness and shortness of breath.   Cardiovascular: Negative for chest pain, palpitations and leg swelling.  Gastrointestinal: Negative for abdominal pain, diarrhea, constipation and abdominal distention.  Genitourinary: Negative for urgency, frequency, decreased urine volume and difficulty urinating.  Musculoskeletal: Negative for back pain, arthralgias and gait problem.  Skin: Negative for color change, pallor and rash.  Neurological: Negative for dizziness, light-headedness, numbness and headaches.  Hematological: Negative for adenopathy. Does not bruise/bleed easily.  Psychiatric/Behavioral: Negative for suicidal ideas, confusion, sleep disturbance, self-injury, dysphoric mood, decreased concentration and agitation.      Objective:    BP 110/74  Pulse 78  Temp(Src) 98.5 F (36.9  C) (Oral)  Ht 5' 7.5" (1.715 m)  Wt 300 lb 14.9 oz (136.5 kg)  BMI 46.41 kg/m2  SpO2 99%  LMP 11/06/2013  Breastfeeding? No General appearance: alert, cooperative, appears stated age and no distress Head: Normocephalic, without obvious  abnormality, atraumatic Eyes: conjunctivae/corneas clear. PERRL, EOM's intact. Fundi benign. Ears: normal TM&#39;s and external ear canals both ears Nose: Nares normal. Septum midline. Mucosa normal. No drainage or sinus tenderness. Throat: lips, mucosa, and tongue normal; teeth and gums normal Neck: no adenopathy, no carotid bruit, no JVD, supple, symmetrical, trachea midline and thyroid not enlarged, symmetric, no tenderness/mass/nodules Back: symmetric, no curvature. ROM normal. No CVA tenderness. Lungs: clear to auscultation bilaterally Breasts: gyn Heart: S1, S2 normal Abdomen: soft, non-tender; bowel sounds normal; no masses,  no organomegaly Pelvic: deferred=-gyn Extremities: extremities normal, atraumatic, no cyanosis or edema Pulses: 2+ and symmetric Skin: Skin color, texture, turgor normal. No rashes or lesions Lymph nodes: Cervical, supraclavicular, and axillary nodes normal. Neurologic: Alert and oriented X 3, normal strength and tone. Normal symmetric reflexes. Normal coordination and gait Psych-- no depression, anxiety=-- trouble focusing at work      Assessment:    Healthy female exam.      Plan:    ghm utd Check labs See After Visit Summary for Counseling Recommendations   1. Preventative health care   - Basic metabolic panel; Future - CBC with Differential; Future - Hepatic function panel; Future - Lipid panel; Future - POCT urinalysis dipstick; Future - TSH; Future  2. Family history of early CAD   - Exercise tolerance test; Future - EKG 12-Lead---nsr   3. ADD (attention deficit disorder)  rto 1 month - amphetamine-dextroamphetamine (ADDERALL XR) 20 MG 24 hr capsule; Take 1 capsule (20 mg total) by mouth every morning.  Dispense: 30 capsule; Refill: 0

## 2013-11-12 NOTE — Progress Notes (Signed)
Pre visit review using our clinic review tool, if applicable. No additional management support is needed unless otherwise documented below in the visit note. 

## 2013-11-12 NOTE — Patient Instructions (Signed)
Preventive Care for Adults A healthy lifestyle and preventive care can promote health and wellness. Preventive health guidelines for women include the following key practices.  A routine yearly physical is a good way to check with your health care provider about your health and preventive screening. It is a chance to share any concerns and updates on your health and to receive a thorough exam.  Visit your dentist for a routine exam and preventive care every 6 months. Brush your teeth twice a day and floss once a day. Good oral hygiene prevents tooth decay and gum disease.  The frequency of eye exams is based on your age, health, family medical history, use of contact lenses, and other factors. Follow your health care provider's recommendations for frequency of eye exams.  Eat a healthy diet. Foods like vegetables, fruits, whole grains, low-fat dairy products, and lean protein foods contain the nutrients you need without too many calories. Decrease your intake of foods high in solid fats, added sugars, and salt. Eat the right amount of calories for you.Get information about a proper diet from your health care provider, if necessary.  Regular physical exercise is one of the most important things you can do for your health. Most adults should get at least 150 minutes of moderate-intensity exercise (any activity that increases your heart rate and causes you to sweat) each week. In addition, most adults need muscle-strengthening exercises on 2 or more days a week.  Maintain a healthy weight. The body mass index (BMI) is a screening tool to identify possible weight problems. It provides an estimate of body fat based on height and weight. Your health care provider can find your BMI and can help you achieve or maintain a healthy weight.For adults 20 years and older:  A BMI below 18.5 is considered underweight.  A BMI of 18.5 to 24.9 is normal.  A BMI of 25 to 29.9 is considered overweight.  A BMI of  30 and above is considered obese.  Maintain normal blood lipids and cholesterol levels by exercising and minimizing your intake of saturated fat. Eat a balanced diet with plenty of fruit and vegetables. Blood tests for lipids and cholesterol should begin at age 76 and be repeated every 5 years. If your lipid or cholesterol levels are high, you are over 50, or you are at high risk for heart disease, you may need your cholesterol levels checked more frequently.Ongoing high lipid and cholesterol levels should be treated with medicines if diet and exercise are not working.  If you smoke, find out from your health care provider how to quit. If you do not use tobacco, do not start.  Lung cancer screening is recommended for adults aged 22-80 years who are at high risk for developing lung cancer because of a history of smoking. A yearly low-dose CT scan of the lungs is recommended for people who have at least a 30-pack-year history of smoking and are a current smoker or have quit within the past 15 years. A pack year of smoking is smoking an average of 1 pack of cigarettes a day for 1 year (for example: 1 pack a day for 30 years or 2 packs a day for 15 years). Yearly screening should continue until the smoker has stopped smoking for at least 15 years. Yearly screening should be stopped for people who develop a health problem that would prevent them from having lung cancer treatment.  If you are pregnant, do not drink alcohol. If you are breastfeeding,  be very cautious about drinking alcohol. If you are not pregnant and choose to drink alcohol, do not have more than 1 drink per day. One drink is considered to be 12 ounces (355 mL) of beer, 5 ounces (148 mL) of wine, or 1.5 ounces (44 mL) of liquor.  Avoid use of street drugs. Do not share needles with anyone. Ask for help if you need support or instructions about stopping the use of drugs.  High blood pressure causes heart disease and increases the risk of  stroke. Your blood pressure should be checked at least every 1 to 2 years. Ongoing high blood pressure should be treated with medicines if weight loss and exercise do not work.  If you are 81-78 years old, ask your health care provider if you should take aspirin to prevent strokes.  Diabetes screening involves taking a blood sample to check your fasting blood sugar level. This should be done once every 3 years, after age 4, if you are within normal weight and without risk factors for diabetes. Testing should be considered at a younger age or be carried out more frequently if you are overweight and have at least 1 risk factor for diabetes.  Breast cancer screening is essential preventive care for women. You should practice "breast self-awareness." This means understanding the normal appearance and feel of your breasts and may include breast self-examination. Any changes detected, no matter how small, should be reported to a health care provider. Women in their 60s and 30s should have a clinical breast exam (CBE) by a health care provider as part of a regular health exam every 1 to 3 years. After age 14, women should have a CBE every year. Starting at age 61, women should consider having a mammogram (breast X-ray test) every year. Women who have a family history of breast cancer should talk to their health care provider about genetic screening. Women at a high risk of breast cancer should talk to their health care providers about having an MRI and a mammogram every year.  Breast cancer gene (BRCA)-related cancer risk assessment is recommended for women who have family members with BRCA-related cancers. BRCA-related cancers include breast, ovarian, tubal, and peritoneal cancers. Having family members with these cancers may be associated with an increased risk for harmful changes (mutations) in the breast cancer genes BRCA1 and BRCA2. Results of the assessment will determine the need for genetic counseling and  BRCA1 and BRCA2 testing.  Routine pelvic exams to screen for cancer are no longer recommended for nonpregnant women who are considered low risk for cancer of the pelvic organs (ovaries, uterus, and vagina) and who do not have symptoms. Ask your health care provider if a screening pelvic exam is right for you.  If you have had past treatment for cervical cancer or a condition that could lead to cancer, you need Pap tests and screening for cancer for at least 20 years after your treatment. If Pap tests have been discontinued, your risk factors (such as having a new sexual partner) need to be reassessed to determine if screening should be resumed. Some women have medical problems that increase the chance of getting cervical cancer. In these cases, your health care provider may recommend more frequent screening and Pap tests.  The HPV test is an additional test that may be used for cervical cancer screening. The HPV test looks for the virus that can cause the cell changes on the cervix. The cells collected during the Pap test can be  tested for HPV. The HPV test could be used to screen women aged 30 years and older, and should be used in women of any age who have unclear Pap test results. After the age of 30, women should have HPV testing at the same frequency as a Pap test.  Colorectal cancer can be detected and often prevented. Most routine colorectal cancer screening begins at the age of 50 years and continues through age 75 years. However, your health care provider may recommend screening at an earlier age if you have risk factors for colon cancer. On a yearly basis, your health care provider may provide home test kits to check for hidden blood in the stool. Use of a small camera at the end of a tube, to directly examine the colon (sigmoidoscopy or colonoscopy), can detect the earliest forms of colorectal cancer. Talk to your health care provider about this at age 50, when routine screening begins. Direct  exam of the colon should be repeated every 5-10 years through age 75 years, unless early forms of pre-cancerous polyps or small growths are found.  People who are at an increased risk for hepatitis B should be screened for this virus. You are considered at high risk for hepatitis B if:  You were born in a country where hepatitis B occurs often. Talk with your health care provider about which countries are considered high risk.  Your parents were born in a high-risk country and you have not received a shot to protect against hepatitis B (hepatitis B vaccine).  You have HIV or AIDS.  You use needles to inject street drugs.  You live with, or have sex with, someone who has hepatitis B.  You get hemodialysis treatment.  You take certain medicines for conditions like cancer, organ transplantation, and autoimmune conditions.  Hepatitis C blood testing is recommended for all people born from 1945 through 1965 and any individual with known risks for hepatitis C.  Practice safe sex. Use condoms and avoid high-risk sexual practices to reduce the spread of sexually transmitted infections (STIs). STIs include gonorrhea, chlamydia, syphilis, trichomonas, herpes, HPV, and human immunodeficiency virus (HIV). Herpes, HIV, and HPV are viral illnesses that have no cure. They can result in disability, cancer, and death.  You should be screened for sexually transmitted illnesses (STIs) including gonorrhea and chlamydia if:  You are sexually active and are younger than 24 years.  You are older than 24 years and your health care provider tells you that you are at risk for this type of infection.  Your sexual activity has changed since you were last screened and you are at an increased risk for chlamydia or gonorrhea. Ask your health care provider if you are at risk.  If you are at risk of being infected with HIV, it is recommended that you take a prescription medicine daily to prevent HIV infection. This is  called preexposure prophylaxis (PrEP). You are considered at risk if:  You are a heterosexual woman, are sexually active, and are at increased risk for HIV infection.  You take drugs by injection.  You are sexually active with a partner who has HIV.  Talk with your health care provider about whether you are at high risk of being infected with HIV. If you choose to begin PrEP, you should first be tested for HIV. You should then be tested every 3 months for as long as you are taking PrEP.  Osteoporosis is a disease in which the bones lose minerals and strength   with aging. This can result in serious bone fractures or breaks. The risk of osteoporosis can be identified using a bone density scan. Women ages 36 years and over and women at risk for fractures or osteoporosis should discuss screening with their health care providers. Ask your health care provider whether you should take a calcium supplement or vitamin D to reduce the rate of osteoporosis.  Menopause can be associated with physical symptoms and risks. Hormone replacement therapy is available to decrease symptoms and risks. You should talk to your health care provider about whether hormone replacement therapy is right for you.  Use sunscreen. Apply sunscreen liberally and repeatedly throughout the day. You should seek shade when your shadow is shorter than you. Protect yourself by wearing long sleeves, pants, a wide-brimmed hat, and sunglasses year round, whenever you are outdoors.  Once a month, do a whole body skin exam, using a mirror to look at the skin on your back. Tell your health care provider of new moles, moles that have irregular borders, moles that are larger than a pencil eraser, or moles that have changed in shape or color.  Stay current with required vaccines (immunizations).  Influenza vaccine. All adults should be immunized every year.  Tetanus, diphtheria, and acellular pertussis (Td, Tdap) vaccine. Pregnant women should  receive 1 dose of Tdap vaccine during each pregnancy. The dose should be obtained regardless of the length of time since the last dose. Immunization is preferred during the 27th-36th week of gestation. An adult who has not previously received Tdap or who does not know her vaccine status should receive 1 dose of Tdap. This initial dose should be followed by tetanus and diphtheria toxoids (Td) booster doses every 10 years. Adults with an unknown or incomplete history of completing a 3-dose immunization series with Td-containing vaccines should begin or complete a primary immunization series including a Tdap dose. Adults should receive a Td booster every 10 years.  Varicella vaccine. An adult without evidence of immunity to varicella should receive 2 doses or a second dose if she has previously received 1 dose. Pregnant females who do not have evidence of immunity should receive the first dose after pregnancy. This first dose should be obtained before leaving the health care facility. The second dose should be obtained 4-8 weeks after the first dose.  Human papillomavirus (HPV) vaccine. Females aged 13-26 years who have not received the vaccine previously should obtain the 3-dose series. The vaccine is not recommended for use in pregnant females. However, pregnancy testing is not needed before receiving a dose. If a female is found to be pregnant after receiving a dose, no treatment is needed. In that case, the remaining doses should be delayed until after the pregnancy. Immunization is recommended for any person with an immunocompromised condition through the age of 79 years if she did not get any or all doses earlier. During the 3-dose series, the second dose should be obtained 4-8 weeks after the first dose. The third dose should be obtained 24 weeks after the first dose and 16 weeks after the second dose.  Zoster vaccine. One dose is recommended for adults aged 28 years or older unless certain conditions are  present.  Measles, mumps, and rubella (MMR) vaccine. Adults born before [birth year] generally are considered immune to measles and mumps. Adults born in [birth year] or later should have 1 or more doses of MMR vaccine unless there is a contraindication to the vaccine or there is laboratory evidence of immunity to  each of the three diseases. A routine second dose of MMR vaccine should be obtained at least 28 days after the first dose for students attending postsecondary schools, health care workers, or international travelers. People who received inactivated measles vaccine or an unknown type of measles vaccine during 1963-1967 should receive 2 doses of MMR vaccine. People who received inactivated mumps vaccine or an unknown type of mumps vaccine before 1979 and are at high risk for mumps infection should consider immunization with 2 doses of MMR vaccine. For females of childbearing age, rubella immunity should be determined. If there is no evidence of immunity, females who are not pregnant should be vaccinated. If there is no evidence of immunity, females who are pregnant should delay immunization until after pregnancy. Unvaccinated health care workers born before 1957 who lack laboratory evidence of measles, mumps, or rubella immunity or laboratory confirmation of disease should consider measles and mumps immunization with 2 doses of MMR vaccine or rubella immunization with 1 dose of MMR vaccine.  Pneumococcal 13-valent conjugate (PCV13) vaccine. When indicated, a person who is uncertain of her immunization history and has no record of immunization should receive the PCV13 vaccine. An adult aged 19 years or older who has certain medical conditions and has not been previously immunized should receive 1 dose of PCV13 vaccine. This PCV13 should be followed with a dose of pneumococcal polysaccharide (PPSV23) vaccine. The PPSV23 vaccine dose should be obtained at least 8 weeks after the dose of PCV13 vaccine. An adult aged 19  years or older who has certain medical conditions and previously received 1 or more doses of PPSV23 vaccine should receive 1 dose of PCV13. The PCV13 vaccine dose should be obtained 1 or more years after the last PPSV23 vaccine dose.  Pneumococcal polysaccharide (PPSV23) vaccine. When PCV13 is also indicated, PCV13 should be obtained first. All adults aged 65 years and older should be immunized. An adult younger than age 65 years who has certain medical conditions should be immunized. Any person who resides in a nursing home or long-term care facility should be immunized. An adult smoker should be immunized. People with an immunocompromised condition and certain other conditions should receive both PCV13 and PPSV23 vaccines. People with human immunodeficiency virus (HIV) infection should be immunized as soon as possible after diagnosis. Immunization during chemotherapy or radiation therapy should be avoided. Routine use of PPSV23 vaccine is not recommended for American Indians, [place] Natives, or people younger than 65 years unless there are medical conditions that require PPSV23 vaccine. When indicated, people who have unknown immunization and have no record of immunization should receive PPSV23 vaccine. One-time revaccination 5 years after the first dose of PPSV23 is recommended for people aged 19-64 years who have chronic kidney failure, nephrotic syndrome, asplenia, or immunocompromised conditions. People who received 1-2 doses of PPSV23 before age 65 years should receive another dose of PPSV23 vaccine at age 65 years or later if at least 5 years have passed since the previous dose. Doses of PPSV23 are not needed for people immunized with PPSV23 at or after age 65 years.  Meningococcal vaccine. Adults with asplenia or persistent complement component deficiencies should receive 2 doses of quadrivalent meningococcal conjugate (MenACWY-D) vaccine. The doses should be obtained at least 2 months apart.  Microbiologists working with certain meningococcal bacteria, military recruits, people at risk during an outbreak, and people who travel to or live in countries with a high rate of meningitis should be immunized. A first-year college student up through age   21 years who is living in a residence hall should receive a dose if she did not receive a dose on or after her 16th birthday. Adults who have certain high-risk conditions should receive one or more doses of vaccine.  Hepatitis A vaccine. Adults who wish to be protected from this disease, have certain high-risk conditions, work with hepatitis A-infected animals, work in hepatitis A research labs, or travel to or work in countries with a high rate of hepatitis A should be immunized. Adults who were previously unvaccinated and who anticipate close contact with an international adoptee during the first 60 days after arrival in the [place] from a country with a high rate of hepatitis A should be immunized.  Hepatitis B vaccine. Adults who wish to be protected from this disease, have certain high-risk conditions, may be exposed to blood or other infectious body fluids, are household contacts or sex partners of hepatitis B positive people, are clients or workers in certain care facilities, or travel to or work in countries with a high rate of hepatitis B should be immunized.  Haemophilus influenzae type b (Hib) vaccine. A previously unvaccinated person with asplenia or sickle cell disease or having a scheduled splenectomy should receive 1 dose of Hib vaccine. Regardless of previous immunization, a recipient of a hematopoietic stem cell transplant should receive a 3-dose series 6-12 months after her successful transplant. Hib vaccine is not recommended for adults with HIV infection. Preventive Services / Frequency Ages 64 to 68 years  Blood pressure check.** / Every 1 to 2 years.  Lipid and cholesterol check.** / Every 5 years beginning at age  22.  Clinical breast exam.** / Every 3 years for women in their 88s and 53s.  BRCA-related cancer risk assessment.** / For women who have family members with a BRCA-related cancer (breast, ovarian, tubal, or peritoneal cancers).  Pap test.** / Every 2 years from ages 90 through 51. Every 3 years starting at age 21 through age 56 or 3 with a history of 3 consecutive normal Pap tests.  HPV screening.** / Every 3 years from ages 24 through ages 1 to 46 with a history of 3 consecutive normal Pap tests.  Hepatitis C blood test.** / For any individual with known risks for hepatitis C.  Skin self-exam. / Monthly.  Influenza vaccine. / Every year.  Tetanus, diphtheria, and acellular pertussis (Tdap, Td) vaccine.** / Consult your health care provider. Pregnant women should receive 1 dose of Tdap vaccine during each pregnancy. 1 dose of Td every 10 years.  Varicella vaccine.** / Consult your health care provider. Pregnant females who do not have evidence of immunity should receive the first dose after pregnancy.  HPV vaccine. / 3 doses over 6 months, if 72 and younger. The vaccine is not recommended for use in pregnant females. However, pregnancy testing is not needed before receiving a dose.  Measles, mumps, rubella (MMR) vaccine.** / You need at least 1 dose of MMR if you were born in 1957 or later. You may also need a 2nd dose. For females of childbearing age, rubella immunity should be determined. If there is no evidence of immunity, females who are not pregnant should be vaccinated. If there is no evidence of immunity, females who are pregnant should delay immunization until after pregnancy.  Pneumococcal 13-valent conjugate (PCV13) vaccine.** / Consult your health care provider.  Pneumococcal polysaccharide (PPSV23) vaccine.** / 1 to 2 doses if you smoke cigarettes or if you have certain conditions.  Meningococcal vaccine.** /  1 dose if you are age 19 to 21 years and a first-year college  student living in a residence hall, or have one of several medical conditions, you need to get vaccinated against meningococcal disease. You may also need additional booster doses.  Hepatitis A vaccine.** / Consult your health care provider.  Hepatitis B vaccine.** / Consult your health care provider.  Haemophilus influenzae type b (Hib) vaccine.** / Consult your health care provider. Ages 40 to 64 years  Blood pressure check.** / Every 1 to 2 years.  Lipid and cholesterol check.** / Every 5 years beginning at age 20 years.  Lung cancer screening. / Every year if you are aged 55-80 years and have a 30-pack-year history of smoking and currently smoke or have quit within the past 15 years. Yearly screening is stopped once you have quit smoking for at least 15 years or develop a health problem that would prevent you from having lung cancer treatment.  Clinical breast exam.** / Every year after age 40 years.  BRCA-related cancer risk assessment.** / For women who have family members with a BRCA-related cancer (breast, ovarian, tubal, or peritoneal cancers).  Mammogram.** / Every year beginning at age 40 years and continuing for as long as you are in good health. Consult with your health care provider.  Pap test.** / Every 3 years starting at age 30 years through age 65 or 70 years with a history of 3 consecutive normal Pap tests.  HPV screening.** / Every 3 years from ages 30 years through ages 65 to 70 years with a history of 3 consecutive normal Pap tests.  Fecal occult blood test (FOBT) of stool. / Every year beginning at age 50 years and continuing until age 75 years. You may not need to do this test if you get a colonoscopy every 10 years.  Flexible sigmoidoscopy or colonoscopy.** / Every 5 years for a flexible sigmoidoscopy or every 10 years for a colonoscopy beginning at age 50 years and continuing until age 75 years.  Hepatitis C blood test.** / For all people born from 1945 through  1965 and any individual with known risks for hepatitis C.  Skin self-exam. / Monthly.  Influenza vaccine. / Every year.  Tetanus, diphtheria, and acellular pertussis (Tdap/Td) vaccine.** / Consult your health care provider. Pregnant women should receive 1 dose of Tdap vaccine during each pregnancy. 1 dose of Td every 10 years.  Varicella vaccine.** / Consult your health care provider. Pregnant females who do not have evidence of immunity should receive the first dose after pregnancy.  Zoster vaccine.** / 1 dose for adults aged 60 years or older.  Measles, mumps, rubella (MMR) vaccine.** / You need at least 1 dose of MMR if you were born in 1957 or later. You may also need a 2nd dose. For females of childbearing age, rubella immunity should be determined. If there is no evidence of immunity, females who are not pregnant should be vaccinated. If there is no evidence of immunity, females who are pregnant should delay immunization until after pregnancy.  Pneumococcal 13-valent conjugate (PCV13) vaccine.** / Consult your health care provider.  Pneumococcal polysaccharide (PPSV23) vaccine.** / 1 to 2 doses if you smoke cigarettes or if you have certain conditions.  Meningococcal vaccine.** / Consult your health care provider.  Hepatitis A vaccine.** / Consult your health care provider.  Hepatitis B vaccine.** / Consult your health care provider.  Haemophilus influenzae type b (Hib) vaccine.** / Consult your health care provider. Ages 65   years and over  Blood pressure check.** / Every 1 to 2 years.  Lipid and cholesterol check.** / Every 5 years beginning at age 22 years.  Lung cancer screening. / Every year if you are aged 73-80 years and have a 30-pack-year history of smoking and currently smoke or have quit within the past 15 years. Yearly screening is stopped once you have quit smoking for at least 15 years or develop a health problem that would prevent you from having lung cancer  treatment.  Clinical breast exam.** / Every year after age 4 years.  BRCA-related cancer risk assessment.** / For women who have family members with a BRCA-related cancer (breast, ovarian, tubal, or peritoneal cancers).  Mammogram.** / Every year beginning at age 40 years and continuing for as long as you are in good health. Consult with your health care provider.  Pap test.** / Every 3 years starting at age 9 years through age 34 or 91 years with 3 consecutive normal Pap tests. Testing can be stopped between 65 and 70 years with 3 consecutive normal Pap tests and no abnormal Pap or HPV tests in the past 10 years.  HPV screening.** / Every 3 years from ages 57 years through ages 64 or 45 years with a history of 3 consecutive normal Pap tests. Testing can be stopped between 65 and 70 years with 3 consecutive normal Pap tests and no abnormal Pap or HPV tests in the past 10 years.  Fecal occult blood test (FOBT) of stool. / Every year beginning at age 15 years and continuing until age 17 years. You may not need to do this test if you get a colonoscopy every 10 years.  Flexible sigmoidoscopy or colonoscopy.** / Every 5 years for a flexible sigmoidoscopy or every 10 years for a colonoscopy beginning at age 86 years and continuing until age 71 years.  Hepatitis C blood test.** / For all people born from [birth year] through 1963 and any individual with known risks for hepatitis C.  Osteoporosis screening.** / A one-time screening for women ages 83 years and over and women at risk for fractures or osteoporosis.  Skin self-exam. / Monthly.  Influenza vaccine. / Every year.  Tetanus, diphtheria, and acellular pertussis (Tdap/Td) vaccine.** / 1 dose of Td every 10 years.  Varicella vaccine.** / Consult your health care provider.  Zoster vaccine.** / 1 dose for adults aged 61 years or older.  Pneumococcal 13-valent conjugate (PCV13) vaccine.** / Consult your health care provider.  Pneumococcal  polysaccharide (PPSV23) vaccine.** / 1 dose for all adults aged 28 years and older.  Meningococcal vaccine.** / Consult your health care provider.  Hepatitis A vaccine.** / Consult your health care provider.  Hepatitis B vaccine.** / Consult your health care provider.  Haemophilus influenzae type b (Hib) vaccine.** / Consult your health care provider. ** Family history and personal history of risk and conditions may change your health care provider's recommendations. Document Released: 04/13/2001 Document Revised: 07/02/2013 Document Reviewed: 07/13/2010 [facility] Patient Information 2015 [place], [place]. This information is not intended to replace advice given to you by your health care provider. Make sure you discuss any questions you have with your health care provider.

## 2013-11-15 ENCOUNTER — Other Ambulatory Visit: Payer: 59

## 2013-11-20 ENCOUNTER — Other Ambulatory Visit (INDEPENDENT_AMBULATORY_CARE_PROVIDER_SITE_OTHER): Payer: 59

## 2013-11-20 DIAGNOSIS — Z0000 Encounter for general adult medical examination without abnormal findings: Secondary | ICD-10-CM

## 2013-11-20 DIAGNOSIS — Z8249 Family history of ischemic heart disease and other diseases of the circulatory system: Secondary | ICD-10-CM

## 2013-11-20 LAB — CBC WITH DIFFERENTIAL/PLATELET
BASOS ABS: 0 10*3/uL (ref 0.0–0.1)
BASOS PCT: 0.4 % (ref 0.0–3.0)
Eosinophils Absolute: 0.2 10*3/uL (ref 0.0–0.7)
Eosinophils Relative: 2.3 % (ref 0.0–5.0)
HCT: 40.9 % (ref 36.0–46.0)
HEMOGLOBIN: 13.2 g/dL (ref 12.0–15.0)
LYMPHS PCT: 26.7 % (ref 12.0–46.0)
Lymphs Abs: 1.8 10*3/uL (ref 0.7–4.0)
MCHC: 32.2 g/dL (ref 30.0–36.0)
MCV: 85 fl (ref 78.0–100.0)
MONOS PCT: 4.7 % (ref 3.0–12.0)
Monocytes Absolute: 0.3 10*3/uL (ref 0.1–1.0)
Neutro Abs: 4.5 10*3/uL (ref 1.4–7.7)
Neutrophils Relative %: 65.9 % (ref 43.0–77.0)
Platelets: 346 10*3/uL (ref 150.0–400.0)
RBC: 4.82 Mil/uL (ref 3.87–5.11)
RDW: 17.1 % — AB (ref 11.5–15.5)
WBC: 6.9 10*3/uL (ref 4.0–10.5)

## 2013-11-20 LAB — TSH: TSH: 1.14 u[IU]/mL (ref 0.35–4.50)

## 2013-11-20 LAB — LIPID PANEL
CHOL/HDL RATIO: 3
Cholesterol: 123 mg/dL (ref 0–200)
HDL: 45.4 mg/dL (ref >39.00)
LDL CALC: 61 mg/dL (ref 0–99)
NonHDL: 77.6
Triglycerides: 81 mg/dL (ref 0.0–149.0)
VLDL: 16.2 mg/dL (ref 0.0–40.0)

## 2013-11-20 LAB — BASIC METABOLIC PANEL
BUN: 12 mg/dL (ref 6–23)
CHLORIDE: 104 meq/L (ref 96–112)
CO2: 27 meq/L (ref 19–32)
Calcium: 8.8 mg/dL (ref 8.4–10.5)
Creatinine, Ser: 0.9 mg/dL (ref 0.4–1.2)
GFR: 74.45 mL/min (ref >60.00)
Glucose, Bld: 90 mg/dL (ref 70–99)
Potassium: 4 mEq/L (ref 3.5–5.1)
Sodium: 136 mEq/L (ref 135–145)

## 2013-11-20 LAB — HEPATIC FUNCTION PANEL
ALBUMIN: 3.5 g/dL (ref 3.5–5.2)
ALT: 40 U/L — AB (ref 0–35)
AST: 23 U/L (ref 0–37)
Alkaline Phosphatase: 69 U/L (ref 39–117)
BILIRUBIN DIRECT: 0 mg/dL (ref 0.0–0.3)
TOTAL PROTEIN: 6.9 g/dL (ref 6.0–8.3)
Total Bilirubin: 0.1 mg/dL — ABNORMAL LOW (ref 0.2–1.2)

## 2013-12-11 ENCOUNTER — Telehealth: Payer: Self-pay | Admitting: Family Medicine

## 2013-12-11 DIAGNOSIS — F988 Other specified behavioral and emotional disorders with onset usually occurring in childhood and adolescence: Secondary | ICD-10-CM

## 2013-12-11 NOTE — Telephone Encounter (Signed)
Last seen and filled 11/12/13 #30 No UDS and No contract    Please advise      KP

## 2013-12-11 NOTE — Telephone Encounter (Signed)
Caller name: Tomeika  Relation to pt: other  Call back Rhame [phone]   Reason for call:  pt requesting amphetamine-dextroamphetamine (ADDERALL XR) 20 MG 24 hr capsule

## 2013-12-12 ENCOUNTER — Ambulatory Visit: Payer: 59 | Admitting: Dietician

## 2013-12-12 MED ORDER — AMPHETAMINE-DEXTROAMPHET ER 20 MG PO CP24: 20.0000 mg | ORAL_CAPSULE | ORAL | Status: DC

## 2013-12-12 NOTE — Telephone Encounter (Signed)
[facility] for #30, needs contract and UDS

## 2013-12-12 NOTE — Telephone Encounter (Signed)
Med filled will notify pt.

## 2013-12-13 ENCOUNTER — Encounter: Payer: 59 | Admitting: Physician Assistant

## 2013-12-25 ENCOUNTER — Ambulatory Visit: Payer: 59 | Admitting: Family Medicine

## 2013-12-31 ENCOUNTER — Encounter: Payer: Self-pay | Admitting: Family Medicine

## 2014-01-01 ENCOUNTER — Ambulatory Visit: Payer: 59 | Admitting: Family Medicine

## 2014-01-08 ENCOUNTER — Encounter: Payer: Self-pay | Admitting: Cardiovascular Disease

## 2014-01-08 ENCOUNTER — Ambulatory Visit (INDEPENDENT_AMBULATORY_CARE_PROVIDER_SITE_OTHER): Payer: 59 | Admitting: Nurse Practitioner

## 2014-01-08 DIAGNOSIS — Z8249 Family history of ischemic heart disease and other diseases of the circulatory system: Secondary | ICD-10-CM

## 2014-01-08 NOTE — Progress Notes (Signed)
Exercise Treadmill Test  Pre-Exercise Testing Evaluation Rhythm: normal sinus  Rate: 81 bpm     Test  Exercise Tolerance Test Ordering MD: Sherren Mocha, MD  Interpreting MD: Truitt Merle, NP  Unique Test No: 1  Treadmill:  1  Indication for ETT: Family History  Contraindication to ETT: No   Stress Modality: exercise - treadmill  Cardiac Imaging Performed: non   Protocol: standard Bruce - maximal  Max BP:  176/77  Max MPHR (bpm):  189 85% MPR (bpm):  161  MPHR obtained (bpm):  173 % MPHR obtained:  91%  Reached 85% MPHR (min:sec):  6:06 Total Exercise Time (min-sec):  7 minutes  Workload in METS:  8.5 Borg Scale: 18  Reason ETT Terminated:  patient's desire to stop    ST Segment Analysis At Rest: normal ST segments - no evidence of significant ST depression With Exercise: no evidence of significant ST depression  Other Information Arrhythmia:  No Angina during ETT:  absent (0) Quality of ETT:  diagnostic  ETT Interpretation:  normal - no evidence of ischemia by ST analysis  Comments: Patient presents today for routine GXT. Has + FH for early CAD - she has no clinical symptoms reported. She is obese.  Today the patient exercised on the standard Bruce protocol for a total of 7 minutes.  Fair exercise tolerance.  Adequate blood pressure response.  Clinically negative for chest pain. Test was stopped due to fatigue/achievement of target HR.  EKG negative for ischemia. No significant arrhythmia noted.   Recommendations: CV risk factor modification encouraged - she has plans to see the nutritionist, has had her lap band adjusted and seems motivated to make changes.   See back prn  Patient is agreeable to this plan and will call if any problems develop in the interim.   Burtis Junes, RN, [facility] [address] [place], [place]  [phone]

## 2014-01-09 ENCOUNTER — Encounter: Payer: Self-pay | Admitting: Dietician

## 2014-01-09 ENCOUNTER — Encounter: Payer: 59 | Attending: Surgery | Admitting: Dietician

## 2014-01-09 DIAGNOSIS — Z6841 Body Mass Index (BMI) 40.0 and over, adult: Secondary | ICD-10-CM | POA: Diagnosis not present

## 2014-01-09 DIAGNOSIS — Z713 Dietary counseling and surveillance: Secondary | ICD-10-CM | POA: Diagnosis not present

## 2014-01-09 NOTE — Progress Notes (Signed)
Medical Nutrition Therapy:  Appt start time: 0830 end time:  0930.   Assessment:  Primary concerns today: Marisa Humphrey reports that she is here today to discuss diet and weight loss. She had a lap band placed in 2006. Following bariatric surgery, her weight went from 311 lbs to 160 lbs. 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 During the postoperative period, she reports she wasn't able to eat enough to meet her needs and had band loosened. Had a fill a month and a half ago and has lost 15 pounds. Has some restriction but can still eat. No vomiting. She weighs herself every other day. Had a big appetite after giving birth 3 months ago and gained weight. Just started taking Adderall. Has a 84-month old and a 60-year old.  Preferred Learning Style:   No preference indicated   Learning Readiness:   Ready   MEDICATIONS: Nuvaring and Adderall   DIETARY INTAKE:  Usual eating pattern includes 3 meals and 2 snacks per day.   24-hr recall:  B ( AM): 2 boiled eggs and 4 bacon strips (occasionally skips breakfast) Snk ( AM): yogurt with granola  L ( PM): salad with cheese and vegetables with Ranch dressing Snk ( PM): almonds and cheese D ( PM): meat with green beans or cabbage and cottage cheese Snk ( PM): none  Beverages: water, sweet tea, and coffee with flavored creamer  Usual physical activity: none  Estimated energy needs: 1000-1200 calories 112-135 g carbohydrates 75-90 g protein  Progress Towards Goal(s):  In progress.   Nutritional Diagnosis:  [place]-3.4 Unintentional weight gain As related to increased appetite following pregnancy and saline removal from Lap Band.  As evidenced by patient report of significant weight gain in recent years.    Intervention:  Nutrition counseling provided. Goals:  Follow Bariatric Surgery Specialized Post-Op Diet (ideas)  Aim for maximum of 15 grams of carbs per meal/10-15 grams per snack  Avoid white starches and starchy  veggies (potatoes, peas, corn, etc)  Avoid sweetened drinks  Eat 3-6 small meals/snacks, every 3-5 hrs  No meal skipping  Increase lean protein foods to meet 60-80g goal  Increase fluid intake to 64oz +  Aim for >30 min of physical activity daily  Walking  Avoid drinking while eating  Get ideas off of Pinterest bariatric boards   Resume daily vitamin supplementation   *(1) complete multivitamin with iron and 2-3 doses of 500 mg calcium citrate  * Make sure to separate the multivitamin and calcium by 2 hours to prevent iron/calcium from binding together and leaving your body  * Make sure to take your calcium in 3 separate doses because your body cannot absorb more than 500 mg at a time   -Try replacing a meal with a protein shake -Focus on lean protein foods and non-starchy vegetables -Protein foods: low fat cheese or cottage cheese, [place] yogurt, boiled eggs, lean meat, [place] sausage -Cut out refined carbs and sugar -Limit high-fiber carbs to 15 grams per meal -Limit portions of Ranch  -Non-starchy vegetables: green beans, asparagus, broccoli, carrots, cauliflower, greens, lettuce and spinach, cucumbers, tomatoes, celery, squash, zucchini, onions -Veggies are good raw or cooked, fresh or frozen  Breakfast ideas: -Bacon and eggs -Protein shake -[place] yogurt with a few almonds (limit granola) -[place] sausage  Lunch and Dinner: -Have mostly meat and vegetables with 1/2 cup or less of starch -Chili with lean meat  -Crockpot chicken or roast (use low fat cream soups) -Broth-based soups  Snack  ideas: -Have healthy snacks on hand at work -[place] yogurt, small amount of nuts, cottage cheese, boiled egg, string cheese or Babybel cheese, hummus with a small portion of triscuits -PB2 cups -Quest bar or Pure Protein bar -Lean beef jerky   Teaching Method Utilized:  Visual Auditory  Handouts given during visit include:  Protein shakes  Phase 3A lean protein  foods   Samples given and patient instructed on proper use: Unjury protein powder (chocolate - qty 2) Lot#: [id number] Exp: 11/2014  Premier protein shake (chocolate - qty 2) Lot#: [id number] Exp: 10/2014  PB2 (qty 2) Lot#: [id number] Exp: 09/2014  Chocolate PB2 (qty 2) Lot#: none provided Exp: 12/2014  Barriers to learning/adherence to lifestyle change: food preferences, schedule  Demonstrated degree of understanding via:  Teach Back   Monitoring/Evaluation:  Dietary intake, exercise, and body weight prn.

## 2014-01-09 NOTE — Patient Instructions (Addendum)
Goals:  Follow Bariatric Surgery Specialized Post-Op Diet (ideas)  Aim for maximum of 15 grams of carbs per meal/10-15 grams per snack  Avoid white starches and starchy veggies (potatoes, peas, corn, etc)  Avoid sweetened drinks  Eat 3-6 small meals/snacks, every 3-5 hrs  No meal skipping  Increase lean protein foods to meet 60-80g goal  Increase fluid intake to 64oz +  Aim for >30 min of physical activity daily  Walking  Avoid drinking while eating  Get ideas off of Pinterest bariatric boards   Resume daily vitamin supplementation   *(1) complete multivitamin with iron and 2-3 doses of 500 mg calcium citrate  * Make sure to separate the multivitamin and calcium by 2 hours to prevent iron/calcium from binding together and leaving your body  * Make sure to take your calcium in 3 separate doses because your body cannot absorb more than 500 mg at a time   -Try replacing a meal with a protein shake -Focus on lean protein foods and non-starchy vegetables -Protein foods: low fat cheese or cottage cheese, [place] yogurt, boiled eggs, lean meat, [place] sausage -Cut out refined carbs and sugar -Limit high-fiber carbs to 15 grams per meal -Limit portions of Ranch  -Non-starchy vegetables: green beans, asparagus, broccoli, carrots, cauliflower, greens, lettuce and spinach, cucumbers, tomatoes, celery, squash, zucchini, onions -Veggies are good raw or cooked, fresh or frozen  Breakfast ideas: -Bacon and eggs -Protein shake -[place] yogurt with a few almonds (limit granola) -[place] sausage  Lunch and Dinner: -Have mostly meat and vegetables with 1/2 cup or less of starch -Chili with lean meat  -Crockpot chicken or roast (use low fat cream soups) -Broth-based soups  Snack ideas: -Have healthy snacks on hand at work -[place] yogurt, small amount of nuts, cottage cheese, boiled egg, string cheese or Babybel cheese, hummus with a small portion of triscuits -PB2 cups -Quest bar or  Pure Protein bar -Lean beef jerky

## 2014-01-15 ENCOUNTER — Ambulatory Visit (INDEPENDENT_AMBULATORY_CARE_PROVIDER_SITE_OTHER): Payer: 59 | Admitting: Family Medicine

## 2014-01-15 ENCOUNTER — Encounter: Payer: Self-pay | Admitting: Family Medicine

## 2014-01-15 VITALS — BP 118/80 | HR 94 | Temp 99.3°F | Wt 289.4 lb

## 2014-01-15 DIAGNOSIS — F909 Attention-deficit hyperactivity disorder, unspecified type: Secondary | ICD-10-CM

## 2014-01-15 DIAGNOSIS — F988 Other specified behavioral and emotional disorders with onset usually occurring in childhood and adolescence: Secondary | ICD-10-CM

## 2014-01-15 MED ORDER — AMPHETAMINE-DEXTROAMPHET ER 20 MG PO CP24
20.0000 mg | ORAL_CAPSULE | ORAL | Status: DC
Start: 2014-01-15 — End: 2014-03-19

## 2014-01-15 MED ORDER — AMPHETAMINE-DEXTROAMPHET ER 30 MG PO CP24: 30.0000 mg | ORAL_CAPSULE | ORAL | Status: DC

## 2014-01-15 MED ORDER — AMPHETAMINE-DEXTROAMPHET ER 20 MG PO CP24: 20.0000 mg | ORAL_CAPSULE | ORAL | Status: DC

## 2014-01-15 NOTE — Patient Instructions (Signed)

## 2014-01-15 NOTE — Progress Notes (Signed)
Pre visit review using our clinic review tool, if applicable. No additional management support is needed unless otherwise documented below in the visit note. 

## 2014-01-15 NOTE — Progress Notes (Signed)
   Subjective:    Patient ID: Marisa Humphrey, female    DOB: 01/16/1981, 33 y.o.   MRN: [id number]  HPI Pt here f/u ADD.  Pt doing well with meds.  No complaints.   Review of Systems As above    Objective:   Physical Exam  BP 118/80 mmHg  Pulse 94  Temp(Src) 99.3 F (37.4 C) (Oral)  Wt 289 lb 6.4 oz (131.271 kg)  SpO2 98% General appearance: alert, cooperative, appears stated age and no distress Throat: lips, mucosa, and tongue normal; teeth and gums normal Neck: no adenopathy, supple, symmetrical, trachea midline and thyroid not enlarged, symmetric, no tenderness/mass/nodules Lungs: clear to auscultation bilaterally Heart: S1, S2 normal Extremities: extremities normal, atraumatic, no cyanosis or edema      Assessment & Plan:  1. ADD (attention deficit disorder) Stable, refill meds, rto 6 months - amphetamine-dextroamphetamine (ADDERALL XR) 20 MG 24 hr capsule; Take 1 capsule (20 mg total) by mouth every morning.  Dispense: 30 capsule; Refill: 0 - amphetamine-dextroamphetamine (ADDERALL XR) 30 MG 24 hr capsule; Take 1 capsule (30 mg total) by mouth every morning.  Dispense: 30 capsule; Refill: 0 - amphetamine-dextroamphetamine (ADDERALL XR) 20 MG 24 hr capsule; Take 1 capsule (20 mg total) by mouth every morning.  Dispense: 30 capsule; Refill: 0  2. Severe obesity (BMI >= 40)

## 2014-02-12 ENCOUNTER — Telehealth: Payer: Self-pay | Admitting: Family Medicine

## 2014-02-12 DIAGNOSIS — F988 Other specified behavioral and emotional disorders with onset usually occurring in childhood and adolescence: Secondary | ICD-10-CM

## 2014-02-12 NOTE — Telephone Encounter (Signed)
Caller name:Solomon, Kortnie Relation to MQ:KMMN Call back [facility]:  Reason for call: pt would like to increase her adderrall, states dr. Etter Sjogren had written one rx for 30 mg however the other rx was for 20 mg. Pt states she will get the 30 mg filled and will not need the new rx until next month and she will bring the old rx back in the office when she picks the new one up.

## 2014-03-19 MED ORDER — AMPHETAMINE-DEXTROAMPHET ER 30 MG PO CP24: 30.0000 mg | ORAL_CAPSULE | ORAL | Status: DC

## 2014-03-19 NOTE — Telephone Encounter (Signed)
[facility] per MD.     Marisa Humphrey

## 2014-06-25 ENCOUNTER — Telehealth: Payer: Self-pay | Admitting: Family Medicine

## 2014-06-25 MED ORDER — AMPHETAMINE-DEXTROAMPHET ER 30 MG PO CP24: 30.0000 mg | ORAL_CAPSULE | ORAL | Status: DC

## 2014-06-25 NOTE — Telephone Encounter (Signed)
Caller name: Kamorah Relation to pt: self Call back number:  [phone] Pharmacy:  Reason for call:   Requesting adderall rx

## 2014-06-25 NOTE — Telephone Encounter (Signed)
Msg left advising Rx ready for pick up.      KP

## 2014-06-26 ENCOUNTER — Telehealth: Payer: Self-pay | Admitting: Family Medicine

## 2014-06-26 NOTE — Telephone Encounter (Signed)
Caller name: Makela Relation to JQ:ZESP Call back number: [phone] Pharmacy:  Reason for call: Pt's Husband came in office to pick up rx but was not given to him since pt did not know that was needed from her an  UDS done. 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 Please advise.

## 2014-06-27 NOTE — Telephone Encounter (Signed)
MSG left to call the office      KP 

## 2014-07-09 ENCOUNTER — Ambulatory Visit: Payer: 59 | Admitting: Family Medicine

## 2014-08-01 ENCOUNTER — Encounter: Payer: Self-pay | Admitting: Family Medicine

## 2014-08-01 ENCOUNTER — Ambulatory Visit (INDEPENDENT_AMBULATORY_CARE_PROVIDER_SITE_OTHER): Payer: 59 | Admitting: Family Medicine

## 2014-08-01 VITALS — BP 130/80 | HR 86 | Temp 99.8°F | Resp 18 | Ht 67.0 in | Wt 272.0 lb

## 2014-08-01 DIAGNOSIS — F329 Major depressive disorder, single episode, unspecified: Secondary | ICD-10-CM | POA: Diagnosis not present

## 2014-08-01 DIAGNOSIS — F17210 Nicotine dependence, cigarettes, uncomplicated: Secondary | ICD-10-CM | POA: Diagnosis not present

## 2014-08-01 DIAGNOSIS — F909 Attention-deficit hyperactivity disorder, unspecified type: Secondary | ICD-10-CM

## 2014-08-01 DIAGNOSIS — F988 Other specified behavioral and emotional disorders with onset usually occurring in childhood and adolescence: Secondary | ICD-10-CM

## 2014-08-01 DIAGNOSIS — F32A Depression, unspecified: Secondary | ICD-10-CM

## 2014-08-01 MED ORDER — AMPHETAMINE-DEXTROAMPHET ER 30 MG PO CP24: 30.0000 mg | ORAL_CAPSULE | ORAL | Status: DC

## 2014-08-01 MED ORDER — BUPROPION HCL ER (XL) 150 MG PO TB24: 150.0000 mg | ORAL_TABLET | Freq: Every day | ORAL | Status: DC

## 2014-08-01 NOTE — Patient Instructions (Addendum)
Attention Deficit Hyperactivity Disorder Attention deficit hyperactivity disorder (ADHD) is a problem with behavior issues based on the way the brain functions (neurobehavioral disorder). It is a common reason for behavior and academic problems in school. SYMPTOMS  There are 3 types of ADHD. The 3 types and some of the symptoms include:  Inattentive.  Gets bored or distracted easily.  Loses or forgets things. Forgets to hand in homework.  Has trouble organizing or completing tasks.  Difficulty staying on task.  An inability to organize daily tasks and school work.  Leaving projects, chores, or homework unfinished.  Trouble paying attention or responding to details. Careless mistakes.  Difficulty following directions. Often seems like is not listening.  Dislikes activities that require sustained attention (like chores or homework).  Hyperactive-impulsive.  Feels like it is impossible to sit still or stay in a seat. Fidgeting with hands and feet.  Trouble waiting turn.  Talking too much or out of turn. Interruptive.  Speaks or acts impulsively.  Aggressive, disruptive behavior.  Constantly busy or on the go; noisy.  Often leaves seat when they are expected to remain seated.  Often runs or climbs where it is not appropriate, or feels very restless.  Combined.  Has symptoms of both of the above. Often children with ADHD feel discouraged about themselves and with school. They often perform well below their abilities in school. As children get older, the excess motor activities can calm down, but the problems with paying attention and staying organized persist. Most children do not outgrow ADHD but with good treatment can learn to cope with the symptoms. DIAGNOSIS  When ADHD is suspected, the diagnosis should be made by professionals trained in ADHD. This professional will collect information about the individual suspected of having ADHD. Information must be collected from  various settings where the person lives, works, or attends school.  Diagnosis will include:  Confirming symptoms began in childhood.  Ruling out other reasons for the child's behavior.  The health care providers will check with the child's school and check their medical records.  They will talk to teachers and parents.  Behavior rating scales for the child will be filled out by those dealing with the child on a daily basis. A diagnosis is made only after all information has been considered. TREATMENT  Treatment usually includes behavioral treatment, tutoring or extra support in school, and stimulant medicines. Because of the way a person's brain works with ADHD, these medicines decrease impulsivity and hyperactivity and increase attention. This is different than how they would work in a person who does not have ADHD. Other medicines used include antidepressants and certain blood pressure medicines. Most experts agree that treatment for ADHD should address all aspects of the person's functioning. Along with medicines, treatment should include structured classroom management at school. Parents should reward good behavior, provide constant discipline, and set limits. Tutoring should be available for the child as needed. ADHD is a lifelong condition. If untreated, the disorder can have long-term serious effects into adolescence and adulthood. HOME CARE INSTRUCTIONS   Often with ADHD there is a lot of frustration among family members dealing with the condition. Blame and anger are also feelings that are common. In many cases, because the problem affects the family as a whole, the entire family may need help. A therapist can help the family find better ways to handle the disruptive behaviors of the person with ADHD and promote change. If the person with ADHD is young, most of the therapist's   work is with the parents. Parents will learn techniques for coping with and improving their child's behavior.  Sometimes only the child with the ADHD needs counseling. Your health care providers can help you make these decisions.  Children with ADHD may need help learning how to organize. Some helpful tips include:  Keep routines the same every day from wake-up time to bedtime. Schedule all activities, including homework and playtime. Keep the schedule in a place where the person with ADHD will often see it. Mark schedule changes as far in advance as possible.  Schedule outdoor and indoor recreation.  Have a place for everything and keep everything in its place. This includes clothing, backpacks, and school supplies.  Encourage writing down assignments and bringing home needed books. Work with your child's teachers for assistance in organizing school work.  Offer your child a well-balanced diet. Breakfast that includes a balance of whole grains, protein, and fruits or vegetables is especially important for school performance. Children should avoid drinks with caffeine including:  Soft drinks.  Coffee.  Tea.  However, some older children (adolescents) may find these drinks helpful in improving their attention. Because it can also be common for adolescents with ADHD to become addicted to caffeine, talk with your health care provider about what is a safe amount of caffeine intake for your child.  Children with ADHD need consistent rules that they can understand and follow. If rules are followed, give small rewards. Children with ADHD often receive, and expect, criticism. Look for good behavior and praise it. Set realistic goals. Give clear instructions. Look for activities that can foster success and self-esteem. Make time for pleasant activities with your child. Give lots of affection.  Parents are their children's greatest advocates. Learn as much as possible about ADHD. This helps you become a stronger and better advocate for your child. It also helps you educate your child's teachers and instructors  if they feel inadequate in these areas. Parent support groups are often helpful. A national group with local chapters is called Children and Adults with Attention Deficit Hyperactivity Disorder (CHADD). SEEK MEDICAL CARE IF:  Your child has repeated muscle twitches, cough, or speech outbursts.  Your child has sleep problems.  Your child has a marked loss of appetite.  Your child develops depression.  Your child has new or worsening behavioral problems.  Your child develops dizziness.  Your child has a racing heart.  Your child has stomach pains.  Your child develops headaches. SEEK IMMEDIATE MEDICAL CARE IF:  Your child has been diagnosed with depression or anxiety and the symptoms seem to be getting worse.  Your child has been depressed and suddenly appears to have increased energy or motivation.  You are worried that your child is having a bad reaction to a medication he or she is taking for ADHD. Document Released: 02/05/2002 Document Revised: 02/20/2013 Document Reviewed: 10/23/2012 [facility] Patient Information 2015 [place], [place]. This information is not intended to replace advice given to you by your health care provider. Make sure you discuss any questions you have with your health care provider. Smoking Cessation Quitting smoking is important to your health and has many advantages. However, it is not always easy to quit since nicotine is a very addictive drug. Oftentimes, people try 3 times or more before being able to quit. This document explains the best ways for you to prepare to quit smoking. Quitting takes hard work and a lot of effort, but you can do it. ADVANTAGES OF QUITTING SMOKING  You will live longer, feel better, and live better.  Your body will feel the impact of quitting smoking almost immediately.  Within 20 minutes, blood pressure decreases. Your pulse returns to its normal level.  After 8 hours, carbon monoxide levels in the blood return to normal.  Your oxygen level increases.  After 24 hours, the chance of having a heart attack starts to decrease. Your breath, hair, and body stop smelling like smoke.  After 48 hours, damaged nerve endings begin to recover. Your sense of taste and smell improve.  After 72 hours, the body is virtually free of nicotine. Your bronchial tubes relax and breathing becomes easier.  After 2 to 12 weeks, lungs can hold more air. Exercise becomes easier and circulation improves.  The risk of having a heart attack, stroke, cancer, or lung disease is greatly reduced.  After 1 year, the risk of coronary heart disease is cut in half.  After 5 years, the risk of stroke falls to the same as a nonsmoker.  After 10 years, the risk of lung cancer is cut in half and the risk of other cancers decreases significantly.  After 15 years, the risk of coronary heart disease drops, usually to the level of a nonsmoker.  If you are pregnant, quitting smoking will improve your chances of having a healthy baby.  The people you live with, especially any children, will be healthier.  You will have extra money to spend on things other than cigarettes. QUESTIONS TO THINK ABOUT BEFORE ATTEMPTING TO QUIT You may want to talk about your answers with your health care provider.  Why do you want to quit?  If you tried to quit in the past, what helped and what did not?  What will be the most difficult situations for you after you quit? How will you plan to handle them?  Who can help you through the tough times? Your family? Friends? A health care provider?  What pleasures do you get from smoking? What ways can you still get pleasure if you quit? Here are some questions to ask your health care provider:  How can you help me to be successful at quitting?  What medicine do you think would be best for me and how should I take it?  What should I do if I need more help?  What is smoking withdrawal like? How can I get information  on withdrawal? GET READY  Set a quit date.  Change your environment by getting rid of all cigarettes, ashtrays, matches, and lighters in your home, car, or work. Do not let people smoke in your home.  Review your past attempts to quit. Think about what worked and what did not. GET SUPPORT AND ENCOURAGEMENT You have a better chance of being successful if you have help. You can get support in many ways.  Tell your family, friends, and coworkers that you are going to quit and need their support. Ask them not to smoke around you.  Get individual, group, or telephone counseling and support. Programs are available at [employer] and health centers. Call your local health department for information about programs in your area.  Spiritual beliefs and practices may help some smokers quit.  Download a "quit meter" on your computer to keep track of quit statistics, such as how long you have gone without smoking, cigarettes not smoked, and money saved.  Get a self-help book about quitting smoking and staying off tobacco. LEARN NEW SKILLS AND BEHAVIORS  Distract yourself from  urges to smoke. Talk to someone, go for a walk, or occupy your time with a task.  Change your normal routine. Take a different route to work. Drink tea instead of coffee. Eat breakfast in a different place.  Reduce your stress. Take a hot bath, exercise, or read a book.  Plan something enjoyable to do every day. Reward yourself for not smoking.  Explore interactive web-based programs that specialize in helping you quit. GET MEDICINE AND USE IT CORRECTLY Medicines can help you stop smoking and decrease the urge to smoke. Combining medicine with the above behavioral methods and support can greatly increase your chances of successfully quitting smoking.  Nicotine replacement therapy helps deliver nicotine to your body without the negative effects and risks of smoking. Nicotine replacement therapy includes nicotine gum,  lozenges, inhalers, nasal sprays, and skin patches. Some may be available over-the-counter and others require a prescription.  Antidepressant medicine helps people abstain from smoking, but how this works is unknown. This medicine is available by prescription.  Nicotinic receptor partial agonist medicine simulates the effect of nicotine in your brain. This medicine is available by prescription. Ask your health care provider for advice about which medicines to use and how to use them based on your health history. Your health care provider will tell you what side effects to look out for if you choose to be on a medicine or therapy. Carefully read the information on the package. Do not use any other product containing nicotine while using a nicotine replacement product.  RELAPSE OR DIFFICULT SITUATIONS Most relapses occur within the first 3 months after quitting. Do not be discouraged if you start smoking again. Remember, most people try several times before finally quitting. You may have symptoms of withdrawal because your body is used to nicotine. You may crave cigarettes, be irritable, feel very hungry, cough often, get headaches, or have difficulty concentrating. The withdrawal symptoms are only temporary. They are strongest when you first quit, but they will go away within 10-14 days. To reduce the chances of relapse, try to:  Avoid drinking alcohol. Drinking lowers your chances of successfully quitting.  Reduce the amount of caffeine you consume. Once you quit smoking, the amount of caffeine in your body increases and can give you symptoms, such as a rapid heartbeat, sweating, and anxiety.  Avoid smokers because they can make you want to smoke.  Do not let weight gain distract you. Many smokers will gain weight when they quit, usually less than 10 pounds. Eat a healthy diet and stay active. You can always lose the weight gained after you quit.  Find ways to improve your mood other than  smoking. FOR MORE INFORMATION  www.smokefree.gov  Document Released: 02/09/2001 Document Revised: 07/02/2013 Document Reviewed: 05/27/2011 [facility] Patient Information 2015 [place], [place]. This information is not intended to replace advice given to you by your health care provider. Make sure you discuss any questions you have with your health care provider.

## 2014-08-01 NOTE — Progress Notes (Signed)
Patient ID: Marisa Humphrey, female    DOB: 1980-09-27  Age: 34 y.o. MRN: [id number]    Subjective:  Subjective HPI Marisa Humphrey presents for f/u add.  Pt also c/o starting to smoke again 5 or less / day and is wanting to go back on wellbutrin again for that and for depression.   No other complaints.    Review of Systems  Constitutional: Negative for activity change, appetite change, fatigue and unexpected weight change.  Respiratory: Negative for cough and shortness of breath.   Cardiovascular: Negative for chest pain, palpitations and leg swelling.  Psychiatric/Behavioral: Positive for dysphoric mood and decreased concentration. Negative for suicidal ideas, behavioral problems, sleep disturbance and self-injury. The patient is not nervous/anxious and is not hyperactive.     History Past Medical History  Diagnosis Date  . Depression   . GERD (gastroesophageal reflux disease)   . Anxiety   . History of condyloma acuminatum   . Hx of varicella   . External hemorrhoids without mention of complication   . Postpartum care following vaginal delivery (7/26) 09/23/2013    She has past surgical history that includes Laparoscopic gastric banding (11/09/2005) and Mole removal.   Her family history includes Cancer in her other; Diabetes in her maternal grandmother and mother; Heart attack (age of onset: 4) in her father; Heart disease (age of onset: 74) in her father; Hyperlipidemia in her mother; Hypertension in her mother; Multiple sclerosis in her brother.She reports that she has quit smoking. Her smoking use included Cigarettes. She has a 8 pack-year smoking history. She has never used smokeless tobacco. She reports that she does not drink alcohol or use illicit drugs.  Current Outpatient Prescriptions on File Prior to Visit  Medication Sig Dispense Refill  . cholecalciferol (VITAMIN D) 1000 UNITS tablet Take 2,000 Units by mouth daily.    Marland Kitchen etonogestrel-ethinyl estradiol (NUVARING)  0.12-0.015 MG/24HR vaginal ring Place 1 each vaginally every 28 (twenty-eight) days. Insert vaginally and leave in place for 3 consecutive weeks, then remove for 1 week.    Marland Kitchen ibuprofen (ADVIL,MOTRIN) 600 MG tablet Take 1 tablet (600 mg total) by mouth every 6 (six) hours. 30 tablet 0   No current facility-administered medications on file prior to visit.     Objective:  Objective Physical Exam  Constitutional: She is oriented to person, place, and time. She appears well-developed and well-nourished.  HENT:  Head: Normocephalic and atraumatic.  Eyes: Conjunctivae and EOM are normal.  Neck: Normal range of motion. Neck supple. No JVD present. Carotid bruit is not present. No thyromegaly present.  Cardiovascular: Normal rate, regular rhythm and normal heart sounds.   No murmur heard. Pulmonary/Chest: Effort normal and breath sounds normal. No respiratory distress. She has no wheezes. She has no rales. She exhibits no tenderness.  Musculoskeletal: She exhibits no edema.  Neurological: She is alert and oriented to person, place, and time.  Psychiatric: She has a normal mood and affect. Her behavior is normal. Judgment and thought content normal.   BP 130/80 mmHg  Pulse 86  Temp(Src) 99.8 F (37.7 C) (Oral)  Resp 18  Ht 5\' 7"  (1.702 m)  Wt 272 lb (123.378 kg)  BMI 42.59 kg/m2  SpO2 98%  LMP 07/31/2014  Breastfeeding? No Wt Readings from Last 3 Encounters:  08/01/14 272 lb (123.378 kg)  01/15/14 289 lb 6.4 oz (131.271 kg)  01/09/14 284 lb 14.4 oz (129.23 kg)     Lab Results  Component Value Date   WBC  6.9 11/20/2013   HGB 13.2 11/20/2013   HCT 40.9 11/20/2013   PLT 346.0 11/20/2013   GLUCOSE 90 11/20/2013   CHOL 123 11/20/2013   TRIG 81.0 11/20/2013   HDL 45.40 11/20/2013   LDLCALC 61 11/20/2013   ALT 40* 11/20/2013   AST 23 11/20/2013   NA 136 11/20/2013   K 4.0 11/20/2013   CL 104 11/20/2013   CREATININE 0.9 11/20/2013   BUN 12 11/20/2013   CO2 27 11/20/2013   TSH  1.14 11/20/2013    US Ob Detail + 14 Wk  05/22/2013   OBSTETRICAL ULTRASOUND: This exam was performed within a [facility]. The OB US report was generated in the [facility] system, and faxed to the ordering physician.   This report is also available in [employer] and in the [employer]. See [facility] Obstetric US report.    Assessment & Plan:  Plan I am having Ms. Solomon start on amphetamine-dextroamphetamine, amphetamine-dextroamphetamine, and buPROPion. I am also having her maintain her cholecalciferol, ibuprofen, etonogestrel-ethinyl estradiol, and amphetamine-dextroamphetamine.  Meds ordered this encounter  Medications  . amphetamine-dextroamphetamine (ADDERALL XR) 30 MG 24 hr capsule    Sig: Take 1 capsule (30 mg total) by mouth every morning.    Dispense:  30 capsule    Refill:  0  . amphetamine-dextroamphetamine (ADDERALL XR) 30 MG 24 hr capsule    Sig: Take 1 capsule (30 mg total) by mouth every morning.    Dispense:  30 capsule    Refill:  0    Do not fill until August 30, 2014  . amphetamine-dextroamphetamine (ADDERALL XR) 30 MG 24 hr capsule    Sig: Take 1 capsule (30 mg total) by mouth every morning.    Dispense:  30 capsule    Refill:  0    Do not fill until September 30, 2014  . buPROPion (WELLBUTRIN XL) 150 MG 24 hr tablet    Sig: Take 1 tablet (150 mg total) by mouth daily.    Dispense:  60 tablet    Refill:  0    Problem List Items Addressed This Visit    None    Visit Diagnoses    ADD (attention deficit disorder)    -  Primary    Relevant Medications    amphetamine-dextroamphetamine (ADDERALL XR) 30 MG 24 hr capsule    amphetamine-dextroamphetamine (ADDERALL XR) 30 MG 24 hr capsule    amphetamine-dextroamphetamine (ADDERALL XR) 30 MG 24 hr capsule    Depression        Relevant Medications    buPROPion (WELLBUTRIN XL) 150 MG 24 hr tablet    Smoking 1/2 pack a day or less        Relevant Medications    buPROPion (WELLBUTRIN XL)  150 MG 24 hr tablet       Follow-up: Return in about 6 months (around 01/31/2015), or if symptoms worsen or fail to improve, for add.  Garnet Koyanagi, DO

## 2014-09-01 ENCOUNTER — Telehealth: Payer: Self-pay | Admitting: Family Medicine

## 2014-09-01 NOTE — Telephone Encounter (Signed)
[facility] Night - Client [place] Patient Name: Marisa Humphrey DOB: Nov 13, 1980 Initial Comment 2 of 2: Caller states she has pink eye, she is having red, itchy and swollen eyes. Currently on vacation in [place]. Nurse Assessment Nurse: Donalynn Furlong, RN, Myna Hidalgo Date/Time Eilene Ghazi Time): 09/01/2014 9:12:39 AM Confirm and document reason for call. If symptomatic, describe symptoms. ---2 of 2: Caller states she has pink eye, she is having red, itchy and swollen eyes. Currently on vacation in [place]. They are at the beach with their nieces who have known "pink-eye" as well. No other s/s to report Has the patient traveled out of the country within the last 30 days? ---No Does the patient require triage? ---Yes Related visit to physician within the last 2 weeks? ---Yes Does the PT have any chronic conditions? (i.e. diabetes, asthma, etc.) ---No Did the patient indicate they were pregnant? ---No Guidelines Guideline Title Affirmed Question Affirmed Notes Eye - Pus or Discharge [1] Eye with yellow/green discharge or eyelashes stick together AND [2] PCP standing order to call in antibiotic eye drops (all triage questions negative) Final Disposition User [place], RN, Myna Hidalgo

## 2014-09-03 NOTE — Telephone Encounter (Signed)
Noted  

## 2014-10-10 ENCOUNTER — Other Ambulatory Visit: Payer: Self-pay | Admitting: Family Medicine

## 2014-10-22 ENCOUNTER — Telehealth: Payer: Self-pay | Admitting: Family Medicine

## 2014-10-22 DIAGNOSIS — F988 Other specified behavioral and emotional disorders with onset usually occurring in childhood and adolescence: Secondary | ICD-10-CM

## 2014-10-22 MED ORDER — BUPROPION HCL ER (XL) 300 MG PO TB24: 300.0000 mg | ORAL_TABLET | Freq: Every day | ORAL | Status: DC

## 2014-10-22 NOTE — Telephone Encounter (Signed)
Ok to refill add med for 3 months Inc wellbutrin xl 300 mg #90 1 po qam, 3 refills

## 2014-10-22 NOTE — Telephone Encounter (Signed)
Please advise      KP 

## 2014-10-22 NOTE — Telephone Encounter (Signed)
Wellbutrin has been sent but it is too soon to fill the Adderall.     KP

## 2014-10-22 NOTE — Telephone Encounter (Signed)
Caller name: Kelicia Youtz  Relationship to patient: Self  Can be reached: [phone]  Pharmacy:  Reason for call: pt is requesting a refill on her Adderall.  Also, pt would like to increase her dosage on her Wellbutrin medication.

## 2014-10-24 ENCOUNTER — Telehealth: Payer: Self-pay | Admitting: Family Medicine

## 2014-10-24 MED ORDER — AMPHETAMINE-DEXTROAMPHET ER 30 MG PO CP24: 30.0000 mg | ORAL_CAPSULE | ORAL | Status: DC

## 2014-10-24 NOTE — Addendum Note (Signed)
Addended by: Ewing Schlein on: 10/24/2014 10:51 AM   Modules accepted: Orders

## 2014-10-24 NOTE — Telephone Encounter (Signed)
Error

## 2014-10-24 NOTE — Telephone Encounter (Signed)
Tried calling the patient but the line was busy. Rx left at check in.      KP

## 2014-10-29 ENCOUNTER — Telehealth: Payer: Self-pay | Admitting: Family Medicine

## 2014-10-29 NOTE — Telephone Encounter (Addendum)
Her med's are at check in, they were filled on 10/24/14. Patient aware.       KP

## 2014-10-29 NOTE — Telephone Encounter (Signed)
Caller name:Albena Relationship to patient:SELF Can be reached: [phone] Pharmacy:  Reason for call:ADDERALL XR 30 MG REFILL

## 2014-11-19 ENCOUNTER — Encounter: Payer: Self-pay | Admitting: Physician Assistant

## 2014-11-19 ENCOUNTER — Ambulatory Visit (INDEPENDENT_AMBULATORY_CARE_PROVIDER_SITE_OTHER): Payer: 59 | Admitting: Physician Assistant

## 2014-11-19 VITALS — BP 98/66 | HR 108 | Temp 98.2°F | Resp 16 | Ht 67.0 in | Wt 262.0 lb

## 2014-11-19 DIAGNOSIS — A084 Viral intestinal infection, unspecified: Secondary | ICD-10-CM

## 2014-11-19 MED ORDER — ONDANSETRON HCL 4 MG PO TABS: 4.0000 mg | ORAL_TABLET | Freq: Three times a day (TID) | ORAL | Status: DC | PRN

## 2014-11-19 MED ORDER — ONDANSETRON HCL 4 MG PO TABS
4.0000 mg | ORAL_TABLET | Freq: Three times a day (TID) | ORAL | Status: DC | PRN
Start: 2014-11-19 — End: 2014-12-27

## 2014-11-19 NOTE — Progress Notes (Signed)
Patient presents to clinic today c/o 4 days of non-bloody diarrhea, abdominal cramping, nausea and fatigue. Endorses episodes of non-bloody emesis at onset of symptoms. IS tolerating PO fluids today without vomiting. Has had 4 loose stools today. Is not taking anything for symptoms. Her children are at home sick with the same symptoms. Denies recent travel or fever.  Past Medical History  Diagnosis Date  . Depression   . GERD (gastroesophageal reflux disease)   . Anxiety   . History of condyloma acuminatum   . Hx of varicella   . External hemorrhoids without mention of complication   . Postpartum care following vaginal delivery (7/26) 09/23/2013    Current Outpatient Prescriptions on File Prior to Visit  Medication Sig Dispense Refill  . amphetamine-dextroamphetamine (ADDERALL XR) 30 MG 24 hr capsule Take 1 capsule (30 mg total) by mouth every morning. 30 capsule 0  . amphetamine-dextroamphetamine (ADDERALL XR) 30 MG 24 hr capsule Take 1 capsule (30 mg total) by mouth every morning. 30 capsule 0  . amphetamine-dextroamphetamine (ADDERALL XR) 30 MG 24 hr capsule Take 1 capsule (30 mg total) by mouth every morning. 30 capsule 0  . buPROPion (WELLBUTRIN XL) 300 MG 24 hr tablet Take 1 tablet (300 mg total) by mouth daily. 90 tablet 3  . cholecalciferol (VITAMIN D) 1000 UNITS tablet Take 2,000 Units by mouth daily.    Marland Kitchen etonogestrel-ethinyl estradiol (NUVARING) 0.12-0.015 MG/24HR vaginal ring Place 1 each vaginally every 28 (twenty-eight) days. Insert vaginally and leave in place for 3 consecutive weeks, then remove for 1 week.    Marland Kitchen ibuprofen (ADVIL,MOTRIN) 600 MG tablet Take 1 tablet (600 mg total) by mouth every 6 (six) hours. (Patient taking differently: Take 600 mg by mouth every 6 (six) hours as needed. ) 30 tablet 0   No current facility-administered medications on file prior to visit.    Allergies  Allergen Reactions  . Penicillins Rash    Amoxil has been taken w/o reaction     Family History  Problem Relation Age of Onset  . Heart attack Father 45  . Heart disease Father 45    MI  . Diabetes Mother   . Hypertension Mother   . Hyperlipidemia Mother   . Cancer Other     breast  . Diabetes Maternal Grandmother   . Multiple sclerosis Brother     Social History   Social History  . Marital Status: Married    Spouse Name: N/A  . Number of Children: 1  . Years of Education: N/A   Occupational History  . nurse [place] History Main Topics  . Smoking status: Former Smoker -- 0.50 packs/day for 16 years    Types: Cigarettes  . Smokeless tobacco: Never Used  . Alcohol Use: No  . Drug Use: No  . Sexual Activity:    Partners: Male   Other Topics Concern  . None   Social History Narrative   Exercising --no   Review of Systems - See HPI.  All other ROS are negative.  BP 98/66 mmHg  Pulse 108  Temp(Src) 98.2 F (36.8 C) (Oral)  Resp 16  Ht 5\' 7"  (1.702 m)  Wt 262 lb (118.842 kg)  BMI 41.03 kg/m2  SpO2 98%  LMP 11/19/2014  Physical Exam  Constitutional: She is well-developed, well-nourished, and in no distress.  HENT:  Head: Normocephalic and atraumatic.  Mouth/Throat: Uvula is midline, oropharynx is clear and moist and  mucous membranes are normal.  Cardiovascular: Normal rate, regular rhythm, normal heart sounds and intact distal pulses.   Pulmonary/Chest: Effort normal and breath sounds normal. No respiratory distress. She has no wheezes. She has no rales. She exhibits no tenderness.  Abdominal: Soft. Bowel sounds are normal. She exhibits no distension and no mass. There is no tenderness. There is no rebound and no guarding.  Skin: Skin is warm and dry. No rash noted.  Psychiatric: Affect normal.  Vitals reviewed.   No results found for this or any previous visit (from the past 2160 hour(s)).  Assessment/Plan: Viral gastroenteritis Rx Zofran. BRAT diet discussed. Supportive measures and  prognosis of Viral GE discussed with patient. Increase fluids and begin probiotic. Follow-up if symptoms are not continuing to resolve.

## 2014-11-19 NOTE — Assessment & Plan Note (Signed)
Rx Zofran. BRAT diet discussed. Supportive measures and prognosis of Viral GE discussed with patient. Increase fluids and begin probiotic. Follow-up if symptoms are not continuing to resolve.

## 2014-11-19 NOTE — Patient Instructions (Addendum)
Stay well-hydrated and get plenty of rest. Use Zofran as directed if needed for nausea. Follow the diet listed below. You can use immodium if needed to slow down frequency of stools. Symptoms should continue to improve over next 48 hours until they resolve.  Nausea is sometimes the last symptom to go away.  If symptoms acutely worsen and are severe or if you cannot tolerate liquids without vomiting, please go to the ER for IV fluids.  Food Choices to Help Relieve Diarrhea When you have diarrhea, the foods you eat and your eating habits are very important. Choosing the right foods and drinks can help relieve diarrhea. Also, because diarrhea can last up to 7 days, you need to replace lost fluids and electrolytes (such as sodium, potassium, and chloride) in order to help prevent dehydration.  WHAT GENERAL GUIDELINES DO I NEED TO FOLLOW?  Slowly drink 1 cup (8 oz) of fluid for each episode of diarrhea. If you are getting enough fluid, your urine will be clear or pale yellow.  Eat starchy foods. Some good choices include white rice, white toast, pasta, low-fiber cereal, baked potatoes (without the skin), saltine crackers, and bagels.  Avoid large servings of any cooked vegetables.  Limit fruit to two servings per day. A serving is  cup or 1 small piece.  Choose foods with less than 2 g of fiber per serving.  Limit fats to less than 8 tsp (38 g) per day.  Avoid fried foods.  Eat foods that have probiotics in them. Probiotics can be found in certain dairy products.  Avoid foods and beverages that may increase the speed at which food moves through the stomach and intestines (gastrointestinal tract). Things to avoid include:  High-fiber foods, such as dried fruit, raw fruits and vegetables, nuts, seeds, and whole grain foods.  Spicy foods and high-fat foods.  Foods and beverages sweetened with high-fructose corn syrup, honey, or sugar alcohols such as xylitol, sorbitol, and mannitol. WHAT  FOODS ARE RECOMMENDED? Grains White rice. White, [place], or pita breads (fresh or toasted), including plain rolls, buns, or bagels. White pasta. Saltine, soda, or graham crackers. Pretzels. Low-fiber cereal. Cooked cereals made with water (such as cornmeal, farina, or cream cereals). Plain muffins. Matzo. Melba toast. Zwieback.  Vegetables Potatoes (without the skin). Strained tomato and vegetable juices. Most well-cooked and canned vegetables without seeds. Tender lettuce. Fruits Cooked or canned applesauce, apricots, cherries, fruit cocktail, grapefruit, peaches, pears, or plums. Fresh bananas, apples without skin, cherries, grapes, cantaloupe, grapefruit, peaches, oranges, or plums.  Meat and Other Protein Products Baked or boiled chicken. Eggs. Tofu. Fish. Seafood. Smooth peanut butter. Ground or well-cooked tender beef, ham, veal, lamb, pork, or poultry.  Dairy Plain yogurt, kefir, and unsweetened liquid yogurt. Lactose-free milk, buttermilk, or soy milk. Plain hard cheese. Beverages Sport drinks. Clear broths. Diluted fruit juices (except prune). Regular, caffeine-free sodas such as ginger ale. Water. Decaffeinated teas. Oral rehydration solutions. Sugar-free beverages not sweetened with sugar alcohols. Other Bouillon, broth, or soups made from recommended foods.  The items listed above may not be a complete list of recommended foods or beverages. Contact your dietitian for more options. WHAT FOODS ARE NOT RECOMMENDED? Grains Whole grain, whole wheat, bran, or rye breads, rolls, pastas, crackers, and cereals. Wild or brown rice. Cereals that contain more than 2 g of fiber per serving. Corn tortillas or taco shells. Cooked or dry oatmeal. Granola. Popcorn. Vegetables Raw vegetables. Cabbage, broccoli, Brussels sprouts, artichokes, baked beans, beet greens, corn, kale, legumes,  peas, sweet potatoes, and yams. Potato skins. Cooked spinach and cabbage. Fruits Dried fruit, including raisins  and dates. Raw fruits. Stewed or dried prunes. Fresh apples with skin, apricots, mangoes, pears, raspberries, and strawberries.  Meat and Other Protein Products Chunky peanut butter. Nuts and seeds. Beans and lentils. Berniece Salines.  Dairy High-fat cheeses. Milk, chocolate milk, and beverages made with milk, such as milk shakes. Cream. Ice cream. Sweets and Desserts Sweet rolls, doughnuts, and sweet breads. Pancakes and waffles. Fats and Oils Butter. Cream sauces. Margarine. Salad oils. Plain salad dressings. Olives. Avocados.  Beverages Caffeinated beverages (such as coffee, tea, soda, or energy drinks). Alcoholic beverages. Fruit juices with pulp. Prune juice. Soft drinks sweetened with high-fructose corn syrup or sugar alcohols. Other Coconut. Hot sauce. Chili powder. Mayonnaise. Gravy. Cream-based or milk-based soups.  The items listed above may not be a complete list of foods and beverages to avoid. Contact your dietitian for more information. WHAT SHOULD I DO IF I BECOME DEHYDRATED? Diarrhea can sometimes lead to dehydration. Signs of dehydration include dark urine and dry mouth and skin. If you think you are dehydrated, you should rehydrate with an oral rehydration solution. These solutions can be purchased at pharmacies, retail stores, or online.  Drink -1 cup (120-240 mL) of oral rehydration solution each time you have an episode of diarrhea. If drinking this amount makes your diarrhea worse, try drinking smaller amounts more often. For example, drink 1-3 tsp (5-15 mL) every 5-10 minutes.  A general rule for staying hydrated is to drink 1-2 L of fluid per day. Talk to your health care provider about the specific amount you should be drinking each day. Drink enough fluids to keep your urine clear or pale yellow. Document Released: 05/08/2003 Document Revised: 02/20/2013 Document Reviewed: 01/08/2013 [facility] Patient Information 2015 [place], [place]. This information is not intended to replace  advice given to you by your health care provider. Make sure you discuss any questions you have with your health care provider.

## 2014-11-19 NOTE — Progress Notes (Signed)
Pre visit review using our clinic review tool, if applicable. No additional management support is needed unless otherwise documented below in the visit note/SLS  

## 2014-12-27 ENCOUNTER — Ambulatory Visit (INDEPENDENT_AMBULATORY_CARE_PROVIDER_SITE_OTHER): Payer: 59 | Admitting: Family Medicine

## 2014-12-27 ENCOUNTER — Encounter: Payer: Self-pay | Admitting: Family Medicine

## 2014-12-27 VITALS — BP 115/84 | HR 79 | Temp 98.4°F | Ht 67.0 in | Wt 268.4 lb

## 2014-12-27 DIAGNOSIS — R8290 Unspecified abnormal findings in urine: Secondary | ICD-10-CM

## 2014-12-27 DIAGNOSIS — F988 Other specified behavioral and emotional disorders with onset usually occurring in childhood and adolescence: Secondary | ICD-10-CM

## 2014-12-27 DIAGNOSIS — F909 Attention-deficit hyperactivity disorder, unspecified type: Secondary | ICD-10-CM

## 2014-12-27 DIAGNOSIS — Z0000 Encounter for general adult medical examination without abnormal findings: Secondary | ICD-10-CM | POA: Diagnosis not present

## 2014-12-27 LAB — CBC WITH DIFFERENTIAL/PLATELET
Basophils Absolute: 0.1 10*3/uL (ref 0.0–0.1)
Basophils Relative: 0.5 % (ref 0.0–3.0)
EOS PCT: 1.4 % (ref 0.0–5.0)
Eosinophils Absolute: 0.2 10*3/uL (ref 0.0–0.7)
HCT: 43.3 % (ref 36.0–46.0)
HEMOGLOBIN: 14.4 g/dL (ref 12.0–15.0)
LYMPHS PCT: 23.6 % (ref 12.0–46.0)
Lymphs Abs: 2.7 10*3/uL (ref 0.7–4.0)
MCHC: 33.3 g/dL (ref 30.0–36.0)
MCV: 87.5 fl (ref 78.0–100.0)
Monocytes Absolute: 0.5 10*3/uL (ref 0.1–1.0)
Monocytes Relative: 4.7 % (ref 3.0–12.0)
Neutro Abs: 7.9 10*3/uL — ABNORMAL HIGH (ref 1.4–7.7)
Neutrophils Relative %: 69.8 % (ref 43.0–77.0)
Platelets: 364 10*3/uL (ref 150.0–400.0)
RBC: 4.95 Mil/uL (ref 3.87–5.11)
RDW: 13.7 % (ref 11.5–15.5)
WBC: 11.3 10*3/uL — AB (ref 4.0–10.5)

## 2014-12-27 LAB — POCT URINALYSIS DIPSTICK
Bilirubin, UA: NEGATIVE
Glucose, UA: NEGATIVE
Ketones, UA: NEGATIVE
LEUKOCYTES UA: NEGATIVE
Nitrite, UA: NEGATIVE
PROTEIN UA: NEGATIVE
Spec Grav, UA: 1.02
UROBILINOGEN UA: NEGATIVE
pH, UA: 6

## 2014-12-27 LAB — LIPID PANEL
Cholesterol: 137 mg/dL (ref 0–200)
HDL: 43.8 mg/dL (ref >39.00)
LDL CALC: 79 mg/dL (ref 0–99)
NonHDL: 92.74
Total CHOL/HDL Ratio: 3
Triglycerides: 67 mg/dL (ref 0.0–149.0)
VLDL: 13.4 mg/dL (ref 0.0–40.0)

## 2014-12-27 LAB — COMPREHENSIVE METABOLIC PANEL
ALBUMIN: 3.9 g/dL (ref 3.5–5.2)
ALK PHOS: 69 U/L (ref 39–117)
ALT: 17 U/L (ref 0–35)
AST: 15 U/L (ref 0–37)
BUN: 11 mg/dL (ref 6–23)
CALCIUM: 9.4 mg/dL (ref 8.4–10.5)
CHLORIDE: 101 meq/L (ref 96–112)
CO2: 27 mEq/L (ref 19–32)
Creatinine, Ser: 0.95 mg/dL (ref 0.40–1.20)
GFR: 72.14 mL/min (ref >60.00)
Glucose, Bld: 94 mg/dL (ref 70–99)
POTASSIUM: 3.9 meq/L (ref 3.5–5.1)
SODIUM: 137 meq/L (ref 135–145)
Total Bilirubin: 0.4 mg/dL (ref 0.2–1.2)
Total Protein: 7.1 g/dL (ref 6.0–8.3)

## 2014-12-27 LAB — TSH: TSH: 1.41 u[IU]/mL (ref 0.35–4.50)

## 2014-12-27 MED ORDER — AMPHETAMINE-DEXTROAMPHET ER 30 MG PO CP24: 30.0000 mg | ORAL_CAPSULE | ORAL | Status: DC

## 2014-12-27 NOTE — Patient Instructions (Signed)
Preventive Care for Adults, Female A healthy lifestyle and preventive care can promote health and wellness. Preventive health guidelines for women include the following key practices.  A routine yearly physical is a good way to check with your health care provider about your health and preventive screening. It is a chance to share any concerns and updates on your health and to receive a thorough exam.  Visit your dentist for a routine exam and preventive care every 6 months. Brush your teeth twice a day and floss once a day. Good oral hygiene prevents tooth decay and gum disease.  The frequency of eye exams is based on your age, health, family medical history, use of contact lenses, and other factors. Follow your health care provider's recommendations for frequency of eye exams.  Eat a healthy diet. Foods like vegetables, fruits, whole grains, low-fat dairy products, and lean protein foods contain the nutrients you need without too many calories. Decrease your intake of foods high in solid fats, added sugars, and salt. Eat the right amount of calories for you.Get information about a proper diet from your health care provider, if necessary.  Regular physical exercise is one of the most important things you can do for your health. Most adults should get at least 150 minutes of moderate-intensity exercise (any activity that increases your heart rate and causes you to sweat) each week. In addition, most adults need muscle-strengthening exercises on 2 or more days a week.  Maintain a healthy weight. The body mass index (BMI) is a screening tool to identify possible weight problems. It provides an estimate of body fat based on height and weight. Your health care provider can find your BMI and can help you achieve or maintain a healthy weight.For adults 20 years and older:  A BMI below 18.5 is considered underweight.  A BMI of 18.5 to 24.9 is normal.  A BMI of 25 to 29.9 is considered overweight.  A  BMI of 30 and above is considered obese.  Maintain normal blood lipids and cholesterol levels by exercising and minimizing your intake of saturated fat. Eat a balanced diet with plenty of fruit and vegetables. Blood tests for lipids and cholesterol should begin at age 45 and be repeated every 5 years. If your lipid or cholesterol levels are high, you are over 50, or you are at high risk for heart disease, you may need your cholesterol levels checked more frequently.Ongoing high lipid and cholesterol levels should be treated with medicines if diet and exercise are not working.  If you smoke, find out from your health care provider how to quit. If you do not use tobacco, do not start.  Lung cancer screening is recommended for adults aged 45-80 years who are at high risk for developing lung cancer because of a history of smoking. A yearly low-dose CT scan of the lungs is recommended for people who have at least a 30-pack-year history of smoking and are a current smoker or have quit within the past 15 years. A pack year of smoking is smoking an average of 1 pack of cigarettes a day for 1 year (for example: 1 pack a day for 30 years or 2 packs a day for 15 years). Yearly screening should continue until the smoker has stopped smoking for at least 15 years. Yearly screening should be stopped for people who develop a health problem that would prevent them from having lung cancer treatment.  If you are pregnant, do not drink alcohol. If you are  breastfeeding, be very cautious about drinking alcohol. If you are not pregnant and choose to drink alcohol, do not have more than 1 drink per day. One drink is considered to be 12 ounces (355 mL) of beer, 5 ounces (148 mL) of wine, or 1.5 ounces (44 mL) of liquor.  Avoid use of street drugs. Do not share needles with anyone. Ask for help if you need support or instructions about stopping the use of drugs.  High blood pressure causes heart disease and increases the risk  of stroke. Your blood pressure should be checked at least every 1 to 2 years. Ongoing high blood pressure should be treated with medicines if weight loss and exercise do not work.  If you are 55-79 years old, ask your health care provider if you should take aspirin to prevent strokes.  Diabetes screening is done by taking a blood sample to check your blood glucose level after you have not eaten for a certain period of time (fasting). If you are not overweight and you do not have risk factors for diabetes, you should be screened once every 3 years starting at age 45. If you are overweight or obese and you are 40-70 years of age, you should be screened for diabetes every year as part of your cardiovascular risk assessment.  Breast cancer screening is essential preventive care for women. You should practice "breast self-awareness." This means understanding the normal appearance and feel of your breasts and may include breast self-examination. Any changes detected, no matter how small, should be reported to a health care provider. Women in their 20s and 30s should have a clinical breast exam (CBE) by a health care provider as part of a regular health exam every 1 to 3 years. After age 40, women should have a CBE every year. Starting at age 40, women should consider having a mammogram (breast X-ray test) every year. Women who have a family history of breast cancer should talk to their health care provider about genetic screening. Women at a high risk of breast cancer should talk to their health care providers about having an MRI and a mammogram every year.  Breast cancer gene (BRCA)-related cancer risk assessment is recommended for women who have family members with BRCA-related cancers. BRCA-related cancers include breast, ovarian, tubal, and peritoneal cancers. Having family members with these cancers may be associated with an increased risk for harmful changes (mutations) in the breast cancer genes BRCA1 and  BRCA2. Results of the assessment will determine the need for genetic counseling and BRCA1 and BRCA2 testing.  Your health care provider may recommend that you be screened regularly for cancer of the pelvic organs (ovaries, uterus, and vagina). This screening involves a pelvic examination, including checking for microscopic changes to the surface of your cervix (Pap test). You may be encouraged to have this screening done every 3 years, beginning at age 21.  For women ages 30-65, health care providers may recommend pelvic exams and Pap testing every 3 years, or they may recommend the Pap and pelvic exam, combined with testing for human papilloma virus (HPV), every 5 years. Some types of HPV increase your risk of cervical cancer. Testing for HPV may also be done on women of any age with unclear Pap test results.  Other health care providers may not recommend any screening for nonpregnant women who are considered low risk for pelvic cancer and who do not have symptoms. Ask your health care provider if a screening pelvic exam is right for   you.  If you have had past treatment for cervical cancer or a condition that could lead to cancer, you need Pap tests and screening for cancer for at least 20 years after your treatment. If Pap tests have been discontinued, your risk factors (such as having a new sexual partner) need to be reassessed to determine if screening should resume. Some women have medical problems that increase the chance of getting cervical cancer. In these cases, your health care provider may recommend more frequent screening and Pap tests.  Colorectal cancer can be detected and often prevented. Most routine colorectal cancer screening begins at the age of 50 years and continues through age 75 years. However, your health care provider may recommend screening at an earlier age if you have risk factors for colon cancer. On a yearly basis, your health care provider may provide home test kits to check  for hidden blood in the stool. Use of a small camera at the end of a tube, to directly examine the colon (sigmoidoscopy or colonoscopy), can detect the earliest forms of colorectal cancer. Talk to your health care provider about this at age 50, when routine screening begins. Direct exam of the colon should be repeated every 5-10 years through age 75 years, unless early forms of precancerous polyps or small growths are found.  People who are at an increased risk for hepatitis B should be screened for this virus. You are considered at high risk for hepatitis B if:  You were born in a country where hepatitis B occurs often. Talk with your health care provider about which countries are considered high risk.  Your parents were born in a high-risk country and you have not received a shot to protect against hepatitis B (hepatitis B vaccine).  You have HIV or AIDS.  You use needles to inject street drugs.  You live with, or have sex with, someone who has hepatitis B.  You get hemodialysis treatment.  You take certain medicines for conditions like cancer, organ transplantation, and autoimmune conditions.  Hepatitis C blood testing is recommended for all people born from 1945 through 1965 and any individual with known risks for hepatitis C.  Practice safe sex. Use condoms and avoid high-risk sexual practices to reduce the spread of sexually transmitted infections (STIs). STIs include gonorrhea, chlamydia, syphilis, trichomonas, herpes, HPV, and human immunodeficiency virus (HIV). Herpes, HIV, and HPV are viral illnesses that have no cure. They can result in disability, cancer, and death.  You should be screened for sexually transmitted illnesses (STIs) including gonorrhea and chlamydia if:  You are sexually active and are younger than 24 years.  You are older than 24 years and your health care provider tells you that you are at risk for this type of infection.  Your sexual activity has changed  since you were last screened and you are at an increased risk for chlamydia or gonorrhea. Ask your health care provider if you are at risk.  If you are at risk of being infected with HIV, it is recommended that you take a prescription medicine daily to prevent HIV infection. This is called preexposure prophylaxis (PrEP). You are considered at risk if:  You are sexually active and do not regularly use condoms or know the HIV status of your partner(s).  You take drugs by injection.  You are sexually active with a partner who has HIV.  Talk with your health care provider about whether you are at high risk of being infected with HIV. If   you choose to begin PrEP, you should first be tested for HIV. You should then be tested every 3 months for as long as you are taking PrEP.  Osteoporosis is a disease in which the bones lose minerals and strength with aging. This can result in serious bone fractures or breaks. The risk of osteoporosis can be identified using a bone density scan. Women ages 67 years and over and women at risk for fractures or osteoporosis should discuss screening with their health care providers. Ask your health care provider whether you should take a calcium supplement or vitamin D to reduce the rate of osteoporosis.  Menopause can be associated with physical symptoms and risks. Hormone replacement therapy is available to decrease symptoms and risks. You should talk to your health care provider about whether hormone replacement therapy is right for you.  Use sunscreen. Apply sunscreen liberally and repeatedly throughout the day. You should seek shade when your shadow is shorter than you. Protect yourself by wearing long sleeves, pants, a wide-brimmed hat, and sunglasses year round, whenever you are outdoors.  Once a month, do a whole body skin exam, using a mirror to look at the skin on your back. Tell your health care provider of new moles, moles that have irregular borders, moles that  are larger than a pencil eraser, or moles that have changed in shape or color.  Stay current with required vaccines (immunizations).  Influenza vaccine. All adults should be immunized every year.  Tetanus, diphtheria, and acellular pertussis (Td, Tdap) vaccine. Pregnant women should receive 1 dose of Tdap vaccine during each pregnancy. The dose should be obtained regardless of the length of time since the last dose. Immunization is preferred during the 27th-36th week of gestation. An adult who has not previously received Tdap or who does not know her vaccine status should receive 1 dose of Tdap. This initial dose should be followed by tetanus and diphtheria toxoids (Td) booster doses every 10 years. Adults with an unknown or incomplete history of completing a 3-dose immunization series with Td-containing vaccines should begin or complete a primary immunization series including a Tdap dose. Adults should receive a Td booster every 10 years.  Varicella vaccine. An adult without evidence of immunity to varicella should receive 2 doses or a second dose if she has previously received 1 dose. Pregnant females who do not have evidence of immunity should receive the first dose after pregnancy. This first dose should be obtained before leaving the health care facility. The second dose should be obtained 4-8 weeks after the first dose.  Human papillomavirus (HPV) vaccine. Females aged 13-26 years who have not received the vaccine previously should obtain the 3-dose series. The vaccine is not recommended for use in pregnant females. However, pregnancy testing is not needed before receiving a dose. If a female is found to be pregnant after receiving a dose, no treatment is needed. In that case, the remaining doses should be delayed until after the pregnancy. Immunization is recommended for any person with an immunocompromised condition through the age of 61 years if she did not get any or all doses earlier. During the  3-dose series, the second dose should be obtained 4-8 weeks after the first dose. The third dose should be obtained 24 weeks after the first dose and 16 weeks after the second dose.  Zoster vaccine. One dose is recommended for adults aged 30 years or older unless certain conditions are present.  Measles, mumps, and rubella (MMR) vaccine. Adults born  before 1957 generally are considered immune to measles and mumps. Adults born in 1957 or later should have 1 or more doses of MMR vaccine unless there is a contraindication to the vaccine or there is laboratory evidence of immunity to each of the three diseases. A routine second dose of MMR vaccine should be obtained at least 28 days after the first dose for students attending postsecondary schools, health care workers, or international travelers. People who received inactivated measles vaccine or an unknown type of measles vaccine during 1963-1967 should receive 2 doses of MMR vaccine. People who received inactivated mumps vaccine or an unknown type of mumps vaccine before 1979 and are at high risk for mumps infection should consider immunization with 2 doses of MMR vaccine. For females of childbearing age, rubella immunity should be determined. If there is no evidence of immunity, females who are not pregnant should be vaccinated. If there is no evidence of immunity, females who are pregnant should delay immunization until after pregnancy. Unvaccinated health care workers born before 1957 who lack laboratory evidence of measles, mumps, or rubella immunity or laboratory confirmation of disease should consider measles and mumps immunization with 2 doses of MMR vaccine or rubella immunization with 1 dose of MMR vaccine.  Pneumococcal 13-valent conjugate (PCV13) vaccine. When indicated, a person who is uncertain of his immunization history and has no record of immunization should receive the PCV13 vaccine. All adults 65 years of age and older should receive this  vaccine. An adult aged 19 years or older who has certain medical conditions and has not been previously immunized should receive 1 dose of PCV13 vaccine. This PCV13 should be followed with a dose of pneumococcal polysaccharide (PPSV23) vaccine. Adults who are at high risk for pneumococcal disease should obtain the PPSV23 vaccine at least 8 weeks after the dose of PCV13 vaccine. Adults older than 32 years of age who have normal immune system function should obtain the PPSV23 vaccine dose at least 1 year after the dose of PCV13 vaccine.  Pneumococcal polysaccharide (PPSV23) vaccine. When PCV13 is also indicated, PCV13 should be obtained first. All adults aged 65 years and older should be immunized. An adult younger than age 65 years who has certain medical conditions should be immunized. Any person who resides in a nursing home or long-term care facility should be immunized. An adult smoker should be immunized. People with an immunocompromised condition and certain other conditions should receive both PCV13 and PPSV23 vaccines. People with human immunodeficiency virus (HIV) infection should be immunized as soon as possible after diagnosis. Immunization during chemotherapy or radiation therapy should be avoided. Routine use of PPSV23 vaccine is not recommended for American Indians, [place] Natives, or people younger than 65 years unless there are medical conditions that require PPSV23 vaccine. When indicated, people who have unknown immunization and have no record of immunization should receive PPSV23 vaccine. One-time revaccination 5 years after the first dose of PPSV23 is recommended for people aged 19-64 years who have chronic kidney failure, nephrotic syndrome, asplenia, or immunocompromised conditions. People who received 1-2 doses of PPSV23 before age 65 years should receive another dose of PPSV23 vaccine at age 65 years or later if at least 5 years have passed since the previous dose. Doses of PPSV23 are not  needed for people immunized with PPSV23 at or after age 65 years.  Meningococcal vaccine. Adults with asplenia or persistent complement component deficiencies should receive 2 doses of quadrivalent meningococcal conjugate (MenACWY-D) vaccine. The doses should be obtained   at least 2 months apart. Microbiologists working with certain meningococcal bacteria, [place] recruits, people at risk during an outbreak, and people who travel to or live in countries with a high rate of meningitis should be immunized. A first-year college student up through age 34 years who is living in a residence hall should receive a dose if she did not receive a dose on or after her 16th birthday. Adults who have certain high-risk conditions should receive one or more doses of vaccine.  Hepatitis A vaccine. Adults who wish to be protected from this disease, have certain high-risk conditions, work with hepatitis A-infected animals, work in hepatitis A research labs, or travel to or work in countries with a high rate of hepatitis A should be immunized. Adults who were previously unvaccinated and who anticipate close contact with an international adoptee during the first 60 days after arrival in the [place] from a country with a high rate of hepatitis A should be immunized.  Hepatitis B vaccine. Adults who wish to be protected from this disease, have certain high-risk conditions, may be exposed to blood or other infectious body fluids, are household contacts or sex partners of hepatitis B positive people, are clients or workers in certain care facilities, or travel to or work in countries with a high rate of hepatitis B should be immunized.  Haemophilus influenzae type b (Hib) vaccine. A previously unvaccinated person with asplenia or sickle cell disease or having a scheduled splenectomy should receive 1 dose of Hib vaccine. Regardless of previous immunization, a recipient of a hematopoietic stem cell transplant should receive a  3-dose series 6-12 months after her successful transplant. Hib vaccine is not recommended for adults with HIV infection. Preventive Services / Frequency Ages 35 to 4 years  Blood pressure check.** / Every 3-5 years.  Lipid and cholesterol check.** / Every 5 years beginning at age 60.  Clinical breast exam.** / Every 3 years for women in their 71s and 10s.  BRCA-related cancer risk assessment.** / For women who have family members with a BRCA-related cancer (breast, ovarian, tubal, or peritoneal cancers).  Pap test.** / Every 2 years from ages 76 through 26. Every 3 years starting at age 61 through age 76 or 93 with a history of 3 consecutive normal Pap tests.  HPV screening.** / Every 3 years from ages 37 through ages 60 to 51 with a history of 3 consecutive normal Pap tests.  Hepatitis C blood test.** / For any individual with known risks for hepatitis C.  Skin self-exam. / Monthly.  Influenza vaccine. / Every year.  Tetanus, diphtheria, and acellular pertussis (Tdap, Td) vaccine.** / Consult your health care provider. Pregnant women should receive 1 dose of Tdap vaccine during each pregnancy. 1 dose of Td every 10 years.  Varicella vaccine.** / Consult your health care provider. Pregnant females who do not have evidence of immunity should receive the first dose after pregnancy.  HPV vaccine. / 3 doses over 6 months, if 93 and younger. The vaccine is not recommended for use in pregnant females. However, pregnancy testing is not needed before receiving a dose.  Measles, mumps, rubella (MMR) vaccine.** / You need at least 1 dose of MMR if you were born in 1957 or later. You may also need a 2nd dose. For females of childbearing age, rubella immunity should be determined. If there is no evidence of immunity, females who are not pregnant should be vaccinated. If there is no evidence of immunity, females who are  pregnant should delay immunization until after pregnancy.  Pneumococcal  13-valent conjugate (PCV13) vaccine.** / Consult your health care provider.  Pneumococcal polysaccharide (PPSV23) vaccine.** / 1 to 2 doses if you smoke cigarettes or if you have certain conditions.  Meningococcal vaccine.** / 1 dose if you are age 68 to 8 years and a Market researcher living in a residence hall, or have one of several medical conditions, you need to get vaccinated against meningococcal disease. You may also need additional booster doses.  Hepatitis A vaccine.** / Consult your health care provider.  Hepatitis B vaccine.** / Consult your health care provider.  Haemophilus influenzae type b (Hib) vaccine.** / Consult your health care provider. Ages 7 to 53 years  Blood pressure check.** / Every year.  Lipid and cholesterol check.** / Every 5 years beginning at age 25 years.  Lung cancer screening. / Every year if you are aged 11-80 years and have a 30-pack-year history of smoking and currently smoke or have quit within the past 15 years. Yearly screening is stopped once you have quit smoking for at least 15 years or develop a health problem that would prevent you from having lung cancer treatment.  Clinical breast exam.** / Every year after age 48 years.  BRCA-related cancer risk assessment.** / For women who have family members with a BRCA-related cancer (breast, ovarian, tubal, or peritoneal cancers).  Mammogram.** / Every year beginning at age 41 years and continuing for as long as you are in good health. Consult with your health care provider.  Pap test.** / Every 3 years starting at age 65 years through age 37 or 70 years with a history of 3 consecutive normal Pap tests.  HPV screening.** / Every 3 years from ages 72 years through ages 60 to 40 years with a history of 3 consecutive normal Pap tests.  Fecal occult blood test (FOBT) of stool. / Every year beginning at age 21 years and continuing until age 5 years. You may not need to do this test if you get  a colonoscopy every 10 years.  Flexible sigmoidoscopy or colonoscopy.** / Every 5 years for a flexible sigmoidoscopy or every 10 years for a colonoscopy beginning at age 35 years and continuing until age 48 years.  Hepatitis C blood test.** / For all people born from [birth year] through 1965 and any individual with known risks for hepatitis C.  Skin self-exam. / Monthly.  Influenza vaccine. / Every year.  Tetanus, diphtheria, and acellular pertussis (Tdap/Td) vaccine.** / Consult your health care provider. Pregnant women should receive 1 dose of Tdap vaccine during each pregnancy. 1 dose of Td every 10 years.  Varicella vaccine.** / Consult your health care provider. Pregnant females who do not have evidence of immunity should receive the first dose after pregnancy.  Zoster vaccine.** / 1 dose for adults aged 30 years or older.  Measles, mumps, rubella (MMR) vaccine.** / You need at least 1 dose of MMR if you were born in 1957 or later. You may also need a second dose. For females of childbearing age, rubella immunity should be determined. If there is no evidence of immunity, females who are not pregnant should be vaccinated. If there is no evidence of immunity, females who are pregnant should delay immunization until after pregnancy.  Pneumococcal 13-valent conjugate (PCV13) vaccine.** / Consult your health care provider.  Pneumococcal polysaccharide (PPSV23) vaccine.** / 1 to 2 doses if you smoke cigarettes or if you have certain conditions.  Meningococcal vaccine.** /  Consult your health care provider.  Hepatitis A vaccine.** / Consult your health care provider.  Hepatitis B vaccine.** / Consult your health care provider.  Haemophilus influenzae type b (Hib) vaccine.** / Consult your health care provider. Ages 64 years and over  Blood pressure check.** / Every year.  Lipid and cholesterol check.** / Every 5 years beginning at age 23 years.  Lung cancer screening. / Every year if you  are aged 16-80 years and have a 30-pack-year history of smoking and currently smoke or have quit within the past 15 years. Yearly screening is stopped once you have quit smoking for at least 15 years or develop a health problem that would prevent you from having lung cancer treatment.  Clinical breast exam.** / Every year after age 74 years.  BRCA-related cancer risk assessment.** / For women who have family members with a BRCA-related cancer (breast, ovarian, tubal, or peritoneal cancers).  Mammogram.** / Every year beginning at age 44 years and continuing for as long as you are in good health. Consult with your health care provider.  Pap test.** / Every 3 years starting at age 58 years through age 22 or 39 years with 3 consecutive normal Pap tests. Testing can be stopped between 65 and 70 years with 3 consecutive normal Pap tests and no abnormal Pap or HPV tests in the past 10 years.  HPV screening.** / Every 3 years from ages 64 years through ages 70 or 61 years with a history of 3 consecutive normal Pap tests. Testing can be stopped between 65 and 70 years with 3 consecutive normal Pap tests and no abnormal Pap or HPV tests in the past 10 years.  Fecal occult blood test (FOBT) of stool. / Every year beginning at age 40 years and continuing until age 27 years. You may not need to do this test if you get a colonoscopy every 10 years.  Flexible sigmoidoscopy or colonoscopy.** / Every 5 years for a flexible sigmoidoscopy or every 10 years for a colonoscopy beginning at age 7 years and continuing until age 32 years.  Hepatitis C blood test.** / For all people born from [birth year] through 1963 and any individual with known risks for hepatitis C.  Osteoporosis screening.** / A one-time screening for women ages 30 years and over and women at risk for fractures or osteoporosis.  Skin self-exam. / Monthly.  Influenza vaccine. / Every year.  Tetanus, diphtheria, and acellular pertussis (Tdap/Td)  vaccine.** / 1 dose of Td every 10 years.  Varicella vaccine.** / Consult your health care provider.  Zoster vaccine.** / 1 dose for adults aged 35 years or older.  Pneumococcal 13-valent conjugate (PCV13) vaccine.** / Consult your health care provider.  Pneumococcal polysaccharide (PPSV23) vaccine.** / 1 dose for all adults aged 46 years and older.  Meningococcal vaccine.** / Consult your health care provider.  Hepatitis A vaccine.** / Consult your health care provider.  Hepatitis B vaccine.** / Consult your health care provider.  Haemophilus influenzae type b (Hib) vaccine.** / Consult your health care provider. ** Family history and personal history of risk and conditions may change your health care provider's recommendations.   This information is not intended to replace advice given to you by your health care provider. Make sure you discuss any questions you have with your health care provider.   Document Released: 04/13/2001 Document Revised: 03/08/2014 Document Reviewed: 07/13/2010 Elsevier Interactive Patient Education [employer].

## 2014-12-27 NOTE — Progress Notes (Signed)
Pre visit review using our clinic review tool, if applicable. No additional management support is needed unless otherwise documented below in the visit note. 

## 2014-12-27 NOTE — Progress Notes (Signed)
Subjective:     Marisa Humphrey is a 34 y.o. female and is here for a comprehensive physical exam. The patient reports no problems.  Social History   Social History  . Marital Status: Married    Spouse Name: N/A  . Number of Children: 1  . Years of Education: N/A   Occupational History  . nurse [employer] History Main Topics  . Smoking status: Former Smoker -- 0.50 packs/day for 16 years    Types: Cigarettes  . Smokeless tobacco: Never Used  . Alcohol Use: No  . Drug Use: No  . Sexual Activity:    Partners: Male   Other Topics Concern  . Not on file   Social History Narrative   Exercising --no   Health Maintenance  Topic Date Due  . INFLUENZA VACCINE  09/30/2015  . PAP SMEAR  11/07/2016  . TETANUS/TDAP  07/18/2023  . HIV Screening  Completed    The following portions of the patient's history were reviewed and updated as appropriate:  She  has a past medical history of Depression; GERD (gastroesophageal reflux disease); Anxiety; History of condyloma acuminatum; varicella; External hemorrhoids without mention of complication; and Postpartum care following vaginal delivery (7/26) (09/23/2013). She  does not have any pertinent problems on file. She  has past surgical history that includes Laparoscopic gastric banding (11/09/2005) and Mole removal. Her family history includes Cancer in her other; Diabetes in her maternal grandmother and mother; Heart attack (age of onset: 65) in her father; Heart disease (age of onset: 25) in her father; Hyperlipidemia in her mother; Hypertension in her mother; Multiple sclerosis in her brother. She  reports that she has quit smoking. Her smoking use included Cigarettes. She has a 8 pack-year smoking history. She has never used smokeless tobacco. She reports that she does not drink alcohol or use illicit drugs. She has a current medication list which includes the following prescription(s):  amphetamine-dextroamphetamine, amphetamine-dextroamphetamine, amphetamine-dextroamphetamine, bupropion, cholecalciferol, etonogestrel-ethinyl estradiol, and ibuprofen. Current Outpatient Prescriptions on File Prior to Visit  Medication Sig Dispense Refill  . buPROPion (WELLBUTRIN XL) 300 MG 24 hr tablet Take 1 tablet (300 mg total) by mouth daily. 90 tablet 3  . cholecalciferol (VITAMIN D) 1000 UNITS tablet Take 2,000 Units by mouth daily.    Marland Kitchen etonogestrel-ethinyl estradiol (NUVARING) 0.12-0.015 MG/24HR vaginal ring Place 1 each vaginally every 28 (twenty-eight) days. Insert vaginally and leave in place for 3 consecutive weeks, then remove for 1 week.    Marland Kitchen ibuprofen (ADVIL,MOTRIN) 600 MG tablet Take 1 tablet (600 mg total) by mouth every 6 (six) hours. (Patient taking differently: Take 600 mg by mouth every 6 (six) hours as needed. ) 30 tablet 0   No current facility-administered medications on file prior to visit.   She is allergic to penicillins..  Review of Systems Review of Systems  Constitutional: Negative for activity change, appetite change and fatigue.  HENT: Negative for hearing loss, congestion, tinnitus and ear discharge.  dentist q2mEyes: Negative for visual disturbance (see optho q1y -- vision corrected to 20/20 with glasses).  Respiratory: Negative for cough, chest tightness and shortness of breath.   Cardiovascular: Negative for chest pain, palpitations and leg swelling.  Gastrointestinal: Negative for abdominal pain, diarrhea, constipation and abdominal distention.  Genitourinary: Negative for urgency, frequency, decreased urine volume and difficulty urinating.  Musculoskeletal: Negative for back pain, arthralgias and gait problem.  Skin: Negative for color change, pallor and rash.  Neurological: Negative for dizziness, light-headedness, numbness and headaches.  Hematological: Negative for adenopathy. Does not bruise/bleed easily.  Psychiatric/Behavioral: Negative for  suicidal ideas, confusion, sleep disturbance, self-injury, dysphoric mood, decreased concentration and agitation.       Objective:    BP 115/84 mmHg  Pulse 79  Temp(Src) 98.4 F (36.9 C) (Oral)  Ht 5' 7" (1.702 m)  Wt 268 lb 6.4 oz (121.745 kg)  BMI 42.03 kg/m2  SpO2 100%  LMP 12/06/2014 General appearance: alert, cooperative, appears stated age and no distress Head: Normocephalic, without obvious abnormality, atraumatic Eyes: conjunctivae/corneas clear. PERRL, EOM's intact. Fundi benign. Ears: normal TM's and external ear canals both ears Nose: Nares normal. Septum midline. Mucosa normal. No drainage or sinus tenderness. Throat: lips, mucosa, and tongue normal; teeth and gums normal Neck: no adenopathy, no carotid bruit, no JVD, supple, symmetrical, trachea midline and thyroid not enlarged, symmetric, no tenderness/mass/nodules Back: symmetric, no curvature. ROM normal. No CVA tenderness. Lungs: clear to auscultation bilaterally  Breasts: gyn Heart: regular rate and rhythm, S1, S2 normal, no murmur, click, rub or gallop Abdomen: soft, non-tender; bowel sounds normal; no masses,  no organomegaly Pelvic: deferred Extremities: extremities normal, atraumatic, no cyanosis or edema Pulses: 2+ and symmetric Skin: Skin color, texture, turgor normal. No rashes or lesions Lymph nodes: Cervical, supraclavicular, and axillary nodes normal. Neurologic: Alert and oriented X 3, normal strength and tone. Normal symmetric reflexes. Normal coordination and gait Psych-- no depression, no anxiety      Assessment:    Healthy female exam.       Plan:    ghm utd Check labs See After Visit Summary for Counseling Recommendations     1. ADD (attention deficit disorder)   - amphetamine-dextroamphetamine (ADDERALL XR) 30 MG 24 hr capsule; Take 1 capsule (30 mg total) by mouth every morning.  Dispense: 30 capsule; Refill: 0 - amphetamine-dextroamphetamine (ADDERALL XR) 30 MG 24 hr capsule;  Take 1 capsule (30 mg total) by mouth every morning.  Dispense: 30 capsule; Refill: 0 - amphetamine-dextroamphetamine (ADDERALL XR) 30 MG 24 hr capsule; Take 1 capsule (30 mg total) by mouth every morning.  Dispense: 30 capsule; Refill: 0  2. Preventative health care   - Comp Met (CMET) - CBC with Differential/Platelet - Lipid panel - POCT urinalysis dipstick - TSH  3. Abnormal urine   - Urine Culture

## 2014-12-29 LAB — URINE CULTURE: Colony Count: >=100000

## 2015-01-01 ENCOUNTER — Telehealth: Payer: Self-pay | Admitting: *Deleted

## 2015-01-01 ENCOUNTER — Telehealth: Payer: Self-pay

## 2015-01-01 NOTE — Telephone Encounter (Signed)
Called and spoke with the pt and informed her of recent lab results and note.  Pt verbalized understanding.  Pt stated that she is not having any uri or uti symptoms.  Pt stated that she is willing to redraw her labs.  Please advise.//AB/CMA

## 2015-01-01 NOTE — Telephone Encounter (Signed)
Patient called regarding message left on her VM about lab results. States she does not have UTI symptoms nor does she have URI symptoms. States she is not sure why her WBC are elevated. Advised patient that I would let Dr. Etter Sjogren know.

## 2015-01-01 NOTE — Telephone Encounter (Signed)
-----   Message from Rosalita Chessman, DO sent at 12/29/2014 12:21 PM EDT ----- Wbc elevated--- any uri symptoms or UTI? If uti symptoms--- repeat UA and culture

## 2015-01-02 NOTE — Telephone Encounter (Signed)
Patient notified

## 2015-01-02 NOTE — Telephone Encounter (Signed)
Only slightly elevated--- we can repeat it at her convenience

## 2015-01-02 NOTE — Telephone Encounter (Signed)
Already answered in another phone note

## 2015-01-20 ENCOUNTER — Emergency Department (HOSPITAL_BASED_OUTPATIENT_CLINIC_OR_DEPARTMENT_OTHER)
Admission: EM | Admit: 2015-01-20 | Discharge: 2015-01-20 | Disposition: A | Discharge status: Home / Self Care | Payer: 59 | Attending: Emergency Medicine | Admitting: Emergency Medicine

## 2015-01-20 ENCOUNTER — Emergency Department (HOSPITAL_BASED_OUTPATIENT_CLINIC_OR_DEPARTMENT_OTHER): Payer: 59

## 2015-01-20 ENCOUNTER — Encounter (HOSPITAL_BASED_OUTPATIENT_CLINIC_OR_DEPARTMENT_OTHER): Payer: Self-pay | Admitting: Emergency Medicine

## 2015-01-20 DIAGNOSIS — Z87891 Personal history of nicotine dependence: Secondary | ICD-10-CM | POA: Diagnosis not present

## 2015-01-20 DIAGNOSIS — F329 Major depressive disorder, single episode, unspecified: Secondary | ICD-10-CM | POA: Insufficient documentation

## 2015-01-20 DIAGNOSIS — Z880 Allergy status to penicillin: Secondary | ICD-10-CM | POA: Diagnosis not present

## 2015-01-20 DIAGNOSIS — R079 Chest pain, unspecified: Secondary | ICD-10-CM | POA: Insufficient documentation

## 2015-01-20 DIAGNOSIS — Z8719 Personal history of other diseases of the digestive system: Secondary | ICD-10-CM | POA: Insufficient documentation

## 2015-01-20 DIAGNOSIS — Z8619 Personal history of other infectious and parasitic diseases: Secondary | ICD-10-CM | POA: Insufficient documentation

## 2015-01-20 DIAGNOSIS — M25512 Pain in left shoulder: Secondary | ICD-10-CM | POA: Insufficient documentation

## 2015-01-20 DIAGNOSIS — F419 Anxiety disorder, unspecified: Secondary | ICD-10-CM | POA: Diagnosis not present

## 2015-01-20 DIAGNOSIS — Z79899 Other long term (current) drug therapy: Secondary | ICD-10-CM | POA: Insufficient documentation

## 2015-01-20 DIAGNOSIS — M542 Cervicalgia: Secondary | ICD-10-CM | POA: Insufficient documentation

## 2015-01-20 LAB — BASIC METABOLIC PANEL
Anion gap: 7 (ref 5–15)
BUN: 13 mg/dL (ref 6–20)
CO2: 25 mmol/L (ref 22–32)
CREATININE: 1.02 mg/dL — AB (ref 0.44–1.00)
Calcium: 9.2 mg/dL (ref 8.9–10.3)
Chloride: 107 mmol/L (ref 101–111)
GFR calc Af Amer: >60 mL/min (ref >60)
Glucose, Bld: 100 mg/dL — ABNORMAL HIGH (ref 65–99)
POTASSIUM: 3.4 mmol/L — AB (ref 3.5–5.1)
SODIUM: 139 mmol/L (ref 135–145)

## 2015-01-20 LAB — CBC WITH DIFFERENTIAL/PLATELET
BASOS PCT: 1 %
Basophils Absolute: 0.1 10*3/uL (ref 0.0–0.1)
EOS ABS: 0.1 10*3/uL (ref 0.0–0.7)
EOS PCT: 1 %
HCT: 43.4 % (ref 36.0–46.0)
Hemoglobin: 14.2 g/dL (ref 12.0–15.0)
LYMPHS ABS: 2.7 10*3/uL (ref 0.7–4.0)
Lymphocytes Relative: 26 %
MCH: 29.1 pg (ref 26.0–34.0)
MCHC: 32.7 g/dL (ref 30.0–36.0)
MCV: 88.9 fL (ref 78.0–100.0)
Monocytes Absolute: 0.4 10*3/uL (ref 0.1–1.0)
Monocytes Relative: 4 %
Neutro Abs: 7.2 10*3/uL (ref 1.7–7.7)
Neutrophils Relative %: 68 %
PLATELETS: 358 10*3/uL (ref 150–400)
RBC: 4.88 MIL/uL (ref 3.87–5.11)
RDW: 13.4 % (ref 11.5–15.5)
WBC: 10.5 10*3/uL (ref 4.0–10.5)

## 2015-01-20 LAB — TROPONIN I

## 2015-01-20 LAB — D-DIMER, QUANTITATIVE: D-Dimer, Quant: <0.27 ug/mL-FEU (ref 0.00–0.50)

## 2015-01-20 NOTE — ED Notes (Signed)
Patient states at @1630  her left chest and left shoulder began aching. Patient reports a significant family history of cardiac disease, father died at age 32 from MI.

## 2015-01-20 NOTE — Discharge Instructions (Signed)
Nonspecific Chest Pain  °Chest pain can be caused by many different conditions. There is always a chance that your pain could be related to something serious, such as a heart attack or a blood clot in your lungs. Chest pain can also be caused by conditions that are not life-threatening. If you have chest pain, it is very important to follow up with your health care provider. °CAUSES  °Chest pain can be caused by: °· Heartburn. °· Pneumonia or bronchitis. °· Anxiety or stress. °· Inflammation around your heart (pericarditis) or lung (pleuritis or pleurisy). °· A blood clot in your lung. °· A collapsed lung (pneumothorax). It can develop suddenly on its own (spontaneous pneumothorax) or from trauma to the chest. °· Shingles infection (varicella-zoster virus). °· Heart attack. °· Damage to the bones, muscles, and cartilage that make up your chest wall. This can include: °¨ Bruised bones due to injury. °¨ Strained muscles or cartilage due to frequent or repeated coughing or overwork. °¨ Fracture to one or more ribs. °¨ Sore cartilage due to inflammation (costochondritis). °RISK FACTORS  °Risk factors for chest pain may include: °· Activities that increase your risk for trauma or injury to your chest. °· Respiratory infections or conditions that cause frequent coughing. °· Medical conditions or overeating that can cause heartburn. °· Heart disease or family history of heart disease. °· Conditions or health behaviors that increase your risk of developing a blood clot. °· Having had chicken pox (varicella zoster). °SIGNS AND SYMPTOMS °Chest pain can feel like: °· Burning or tingling on the surface of your chest or deep in your chest. °· Crushing, pressure, aching, or squeezing pain. °· Dull or sharp pain that is worse when you move, cough, or take a deep breath. °· Pain that is also felt in your back, neck, shoulder, or arm, or pain that spreads to any of these areas. °Your chest pain may come and go, or it may stay  constant. °DIAGNOSIS °Lab tests or other studies may be needed to find the cause of your pain. Your health care provider may have you take a test called an ambulatory ECG (electrocardiogram). An ECG records your heartbeat patterns at the time the test is performed. You may also have other tests, such as: °· Transthoracic echocardiogram (TTE). During echocardiography, sound waves are used to create a picture of all of the heart structures and to look at how blood flows through your heart. °· Transesophageal echocardiogram (TEE). This is a more advanced imaging test that obtains images from inside your body. It allows your health care provider to see your heart in finer detail. °· Cardiac monitoring. This allows your health care provider to monitor your heart rate and rhythm in real time. °· Holter monitor. This is a portable device that records your heartbeat and can help to diagnose abnormal heartbeats. It allows your health care provider to track your heart activity for several days, if needed. °· Stress tests. These can be done through exercise or by taking medicine that makes your heart beat more quickly. °· Blood tests. °· Imaging tests. °TREATMENT  °Your treatment depends on what is causing your chest pain. Treatment may include: °· Medicines. These may include: °¨ Acid blockers for heartburn. °¨ Anti-inflammatory medicine. °¨ Pain medicine for inflammatory conditions. °¨ Antibiotic medicine, if an infection is present. °¨ Medicines to dissolve blood clots. °¨ Medicines to treat coronary artery disease. °· Supportive care for conditions that do not require medicines. This may include: °¨ Resting. °¨ Applying heat   or cold packs to injured areas. °¨ Limiting activities until pain decreases. °HOME CARE INSTRUCTIONS °· If you were prescribed an antibiotic medicine, finish it all even if you start to feel better. °· Avoid any activities that bring on chest pain. °· Do not use any tobacco products, including  cigarettes, chewing tobacco, or electronic cigarettes. If you need help quitting, ask your health care provider. °· Do not drink alcohol. °· Take medicines only as directed by your health care provider. °· Keep all follow-up visits as directed by your health care provider. This is important. This includes any further testing if your chest pain does not go away. °· If heartburn is the cause for your chest pain, you may be told to keep your head raised (elevated) while sleeping. This reduces the chance that acid will go from your stomach into your esophagus. °· Make lifestyle changes as directed by your health care provider. These may include: °¨ Getting regular exercise. Ask your health care provider to suggest some activities that are safe for you. °¨ Eating a heart-healthy diet. A registered dietitian can help you to learn healthy eating options. °¨ Maintaining a healthy weight. °¨ Managing diabetes, if necessary. °¨ Reducing stress. °SEEK MEDICAL CARE IF: °· Your chest pain does not go away after treatment. °· You have a rash with blisters on your chest. °· You have a fever. °SEEK IMMEDIATE MEDICAL CARE IF:  °· Your chest pain is worse. °· You have an increasing cough, or you cough up blood. °· You have severe abdominal pain. °· You have severe weakness. °· You faint. °· You have chills. °· You have sudden, unexplained chest discomfort. °· You have sudden, unexplained discomfort in your arms, back, neck, or jaw. °· You have shortness of breath at any time. °· You suddenly start to sweat, or your skin gets clammy. °· You feel nauseous or you vomit. °· You suddenly feel light-headed or dizzy. °· Your heart begins to beat quickly, or it feels like it is skipping beats. °These symptoms may represent a serious problem that is an emergency. Do not wait to see if the symptoms will go away. Get medical help right away. Call your local emergency services (911 in the [place] Do not drive yourself to the hospital. °  °This  information is not intended to replace advice given to you by your health care provider. Make sure you discuss any questions you have with your health care provider. °  °Document Released: 11/25/2004 Document Revised: 03/08/2014 Document Reviewed: 09/21/2013 °Elsevier Interactive Patient Education ©2016 [employer] ° °

## 2015-01-20 NOTE — ED Notes (Signed)
Patient reports that she has pain to her left shoulder and neck. PAtient states that the pain started at about 430

## 2015-01-20 NOTE — ED Provider Notes (Signed)
CSN: [id number]     Arrival date & time 01/20/15  2037 History  By signing my name below, I, Eustaquio Maize, attest that this documentation has been prepared under the direction and in the presence of Debby Freiberg, MD. Electronically Signed: Eustaquio Maize, ED Scribe. 01/20/2015. 9:11 PM.   Chief Complaint  Patient presents with  . Shoulder Pain   Patient is a 34 y.o. female presenting with chest pain. The history is provided by the patient. No language interpreter was used.  Chest Pain Pain location:  L chest Pain quality: dull   Pain radiates to:  Neck and L shoulder Pain radiates to the back: no   Pain severity:  Moderate Onset quality:  Sudden Duration:  4 hours Timing:  Constant Progression:  Unchanged Chronicity:  New Relieved by:  None tried Worsened by:  Nothing tried Ineffective treatments:  None tried Associated symptoms: no back pain, no cough, no diaphoresis, no nausea, no palpitations, no shortness of breath and not vomiting   Risk factors: birth control   Risk factors: no high cholesterol, no hypertension and not female      HPI Comments: Marisa Humphrey is a 34 y.o. female who presents to the Emergency Department complaining of sudden onset, constant, dull, left sided chest pain, left shoulder pain, and left neck pain that began at 4:30 PM today (approximately 4.5 hours ago). Pt was playing with her daughter when the pain came on. There are no modifying factors to the pain. Similar symptoms in the past that resolved on its ownDenies cough, shortness of breath, nausea, vomiting, leg swelling, leg pain, or any other associated symptoms. Fhx MI (father at age 79). Pt is former smoker who quit 3 months ago. She is currently on birth control.    Past Medical History  Diagnosis Date  . Depression   . GERD (gastroesophageal reflux disease)   . Anxiety   . History of condyloma acuminatum   . Hx of varicella   . External hemorrhoids without mention of complication   .  Postpartum care following vaginal delivery (7/26) 09/23/2013   Past Surgical History  Procedure Laterality Date  . Laparoscopic gastric banding  11/09/2005    Dr.Martin  . Mole removal     Family History  Problem Relation Age of Onset  . Heart attack Father 84  . Heart disease Father 20    MI  . Diabetes Mother   . Hypertension Mother   . Hyperlipidemia Mother   . Cancer Other     breast  . Diabetes Maternal Grandmother   . Multiple sclerosis Brother    Social History  Substance Use Topics  . Smoking status: Former Smoker -- 0.50 packs/day for 16 years    Types: Cigarettes  . Smokeless tobacco: Never Used  . Alcohol Use: No   OB History    Gravida Para Term Preterm AB TAB SAB Ectopic Multiple Living   3 2 2  1  1   2      Review of Systems  Constitutional: Negative for diaphoresis.  Respiratory: Negative for cough and shortness of breath.   Cardiovascular: Positive for chest pain. Negative for palpitations and leg swelling.  Gastrointestinal: Negative for nausea and vomiting.  Musculoskeletal: Positive for arthralgias (Left shoulder) and neck pain. Negative for back pain.  All other systems reviewed and are negative.  Allergies  Penicillins  Home Medications   Prior to Admission medications   Medication Sig Start Date End Date Taking? Authorizing Provider  amphetamine-dextroamphetamine (  ADDERALL XR) 30 MG 24 hr capsule Take 1 capsule (30 mg total) by mouth every morning. 12/27/14   Rosalita Chessman, DO  amphetamine-dextroamphetamine (ADDERALL XR) 30 MG 24 hr capsule Take 1 capsule (30 mg total) by mouth every morning. 12/27/14   Rosalita Chessman, DO  amphetamine-dextroamphetamine (ADDERALL XR) 30 MG 24 hr capsule Take 1 capsule (30 mg total) by mouth every morning. 12/27/14   Rosalita Chessman, DO  buPROPion (WELLBUTRIN XL) 300 MG 24 hr tablet Take 1 tablet (300 mg total) by mouth daily. 10/22/14   Rosalita Chessman, DO  cholecalciferol (VITAMIN D) 1000 UNITS tablet Take 2,000  Units by mouth daily.    Historical Provider, MD  etonogestrel-ethinyl estradiol (NUVARING) 0.12-0.015 MG/24HR vaginal ring Place 1 each vaginally every 28 (twenty-eight) days. Insert vaginally and leave in place for 3 consecutive weeks, then remove for 1 week.    Historical Provider, MD  ibuprofen (ADVIL,MOTRIN) 600 MG tablet Take 1 tablet (600 mg total) by mouth every 6 (six) hours. Patient taking differently: Take 600 mg by mouth every 6 (six) hours as needed.  09/25/13   Laury Deep, CNM   Triage Vitals: BP 133/94 mmHg  Pulse 86  Temp(Src) 98.5 F (36.9 C) (Oral)  Resp 20  Ht 5\' 7"  (1.702 m)  Wt 269 lb (122.018 kg)  BMI 42.12 kg/m2  SpO2 100%  LMP 01/06/2015   Physical Exam  Constitutional: She is oriented to person, place, and time. She appears well-developed and well-nourished.  HENT:  Head: Normocephalic and atraumatic.  Right Ear: External ear normal.  Left Ear: External ear normal.  Eyes: Conjunctivae and EOM are normal. Pupils are equal, round, and reactive to light.  Neck: Normal range of motion. Neck supple.  Cardiovascular: Normal rate, regular rhythm, normal heart sounds and intact distal pulses.   Pulmonary/Chest: Effort normal and breath sounds normal. She exhibits tenderness.  Abdominal: Soft. Bowel sounds are normal. There is no tenderness.  Musculoskeletal: Normal range of motion.  Neurological: She is alert and oriented to person, place, and time.  Skin: Skin is warm and dry.  Vitals reviewed.   ED Course  Procedures (including critical care time)  DIAGNOSTIC STUDIES: Oxygen Saturation is 100% on RA, normal by my interpretation.    COORDINATION OF CARE: 9:07 PM-Discussed treatment plan which includes CXR, CBC, BMP, and Troponin with pt at bedside and pt agreed to plan.   Labs Review Labs Reviewed  BASIC METABOLIC PANEL - Abnormal; Notable for the following:    Potassium 3.4 (*)    Glucose, Bld 100 (*)    Creatinine, Ser 1.02 (*)    All other  components within normal limits  CBC WITH DIFFERENTIAL/PLATELET  TROPONIN I  D-DIMER, QUANTITATIVE (NOT AT [facility])    Imaging Review Dg Chest 2 View  01/20/2015  CLINICAL DATA:  Left chest pain radiating up into the neck and shoulder. EXAM: CHEST  2 VIEW COMPARISON:  01/04/2005 FINDINGS: The heart size and mediastinal contours are within normal limits. Both lungs are clear. The visualized skeletal structures are unremarkable. IMPRESSION: No active cardiopulmonary disease. Electronically Signed   By: Van Clines M.D.   On: 01/20/2015 21:30   I have personally reviewed and evaluated these images and lab results as part of my medical decision-making.   EKG Interpretation   Date/Time:  Monday January 20 2015 20:54:25 EST Ventricular Rate:  80 PR Interval:  152 QRS Duration: 92 QT Interval:  384 QTC Calculation: 442 R Axis:  25 Text Interpretation:  Normal sinus rhythm Normal ECG No significant change  since last tracing Confirmed by Debby Freiberg [phone]) on 01/20/2015  9:07:09 PM      MDM   Final diagnoses:  Left shoulder pain    34 y.o. female with pertinent PMH of anxiety, depression, GERD presents with L shoulder and chest pain as above.  Low risk for PE, however does have a family ho early MI.  No new symptoms since 4 PM.  Exam as above with mild chest wall tenderness.  Wu unremarkable.  DC home in stable condition.    I have reviewed all laboratory and imaging studies if ordered as above  1. Left shoulder pain           Debby Freiberg, MD 01/20/15 2234

## 2015-04-28 ENCOUNTER — Telehealth: Payer: Self-pay | Admitting: Family Medicine

## 2015-04-28 DIAGNOSIS — F988 Other specified behavioral and emotional disorders with onset usually occurring in childhood and adolescence: Secondary | ICD-10-CM

## 2015-04-28 MED ORDER — AMPHETAMINE-DEXTROAMPHET ER 30 MG PO CP24: 30.0000 mg | ORAL_CAPSULE | ORAL | Status: DC

## 2015-04-28 NOTE — Telephone Encounter (Signed)
Relation to [id number] Call back [phone] Pharmacy:  Reason for call:  Patient requesting a refill amphetamine-dextroamphetamine (ADDERALL XR) 30 MG 24 hr capsule .sw

## 2015-04-28 NOTE — Telephone Encounter (Signed)
Patient aware Rx will be ready for pick up today.      KP 

## 2015-08-01 ENCOUNTER — Telehealth: Payer: Self-pay | Admitting: Family Medicine

## 2015-08-01 NOTE — Telephone Encounter (Signed)
Caller name: Self  Can be [phone]      Reason for call: Refill amphetamine-dextroamphetamine (ADDERALL XR) 30 MG 24 hr capsule

## 2015-08-01 NOTE — Telephone Encounter (Signed)
Patient is over due for an OV.    KP

## 2015-08-04 ENCOUNTER — Telehealth: Payer: Self-pay | Admitting: Behavioral Health

## 2015-08-04 NOTE — Telephone Encounter (Signed)
Duplicate request. See telephone note on 08/01/15.

## 2015-08-05 NOTE — Telephone Encounter (Signed)
LVM advising patient of message below °

## 2015-08-07 ENCOUNTER — Ambulatory Visit (INDEPENDENT_AMBULATORY_CARE_PROVIDER_SITE_OTHER): Payer: 59 | Admitting: Family Medicine

## 2015-08-07 ENCOUNTER — Encounter: Payer: Self-pay | Admitting: Family Medicine

## 2015-08-07 VITALS — BP 120/72 | HR 74 | Temp 99.0°F | Ht 67.0 in | Wt 280.8 lb

## 2015-08-07 DIAGNOSIS — F909 Attention-deficit hyperactivity disorder, unspecified type: Secondary | ICD-10-CM

## 2015-08-07 DIAGNOSIS — F988 Other specified behavioral and emotional disorders with onset usually occurring in childhood and adolescence: Secondary | ICD-10-CM

## 2015-08-07 MED ORDER — AMPHETAMINE-DEXTROAMPHET ER 30 MG PO CP24: 30.0000 mg | ORAL_CAPSULE | ORAL | Status: DC

## 2015-08-07 NOTE — Patient Instructions (Signed)

## 2015-08-07 NOTE — Progress Notes (Signed)
Pre visit review using our clinic review tool, if applicable. No additional management support is needed unless otherwise documented below in the visit note. 

## 2015-08-07 NOTE — Progress Notes (Signed)
Patient ID: Marisa Humphrey, female    DOB: [birth year]  Age: 35 y.o. MRN: [id number]    Subjective:  Subjective HPI ADAUGO KONOPACKI presents for f/u adhd.  No complaintsl  Review of Systems  Constitutional: Negative for diaphoresis, appetite change, fatigue and unexpected weight change.  Eyes: Negative for pain, redness and visual disturbance.  Respiratory: Negative for cough, chest tightness, shortness of breath and wheezing.   Cardiovascular: Negative for chest pain, palpitations and leg swelling.  Endocrine: Negative for cold intolerance, heat intolerance, polydipsia, polyphagia and polyuria.  Genitourinary: Negative for dysuria, frequency and difficulty urinating.  Neurological: Negative for dizziness, light-headedness, numbness and headaches.    History Past Medical History  Diagnosis Date  . Depression   . GERD (gastroesophageal reflux disease)   . Anxiety   . History of condyloma acuminatum   . Hx of varicella   . External hemorrhoids without mention of complication   . Postpartum care following vaginal delivery (7/26) 09/23/2013    She has past surgical history that includes Laparoscopic gastric banding (11/09/2005) and Mole removal.   Her family history includes Cancer in her other; Diabetes in her maternal grandmother and mother; Heart attack (age of onset: 57) in her father; Heart disease (age of onset: 54) in her father; Hyperlipidemia in her mother; Hypertension in her mother; Multiple sclerosis in her brother.She reports that she has quit smoking. Her smoking use included Cigarettes. She has a 8 pack-year smoking history. She has never used smokeless tobacco. She reports that she does not drink alcohol or use illicit drugs.  Current Outpatient Prescriptions on File Prior to Visit  Medication Sig Dispense Refill  . amphetamine-dextroamphetamine (ADDERALL XR) 30 MG 24 hr capsule Take 1 capsule (30 mg total) by mouth every morning. 30 capsule 0  . buPROPion (WELLBUTRIN  XL) 300 MG 24 hr tablet Take 1 tablet (300 mg total) by mouth daily. 90 tablet 3  . cholecalciferol (VITAMIN D) 1000 UNITS tablet Take 2,000 Units by mouth daily.    Marland Kitchen etonogestrel-ethinyl estradiol (NUVARING) 0.12-0.015 MG/24HR vaginal ring Place 1 each vaginally every 28 (twenty-eight) days. Insert vaginally and leave in place for 3 consecutive weeks, then remove for 1 week.    Marland Kitchen ibuprofen (ADVIL,MOTRIN) 600 MG tablet Take 1 tablet (600 mg total) by mouth every 6 (six) hours. (Patient taking differently: Take 600 mg by mouth every 6 (six) hours as needed. ) 30 tablet 0   No current facility-administered medications on file prior to visit.     Objective:  Objective Physical Exam  Constitutional: She is oriented to person, place, and time. She appears well-developed and well-nourished.  HENT:  Head: Normocephalic and atraumatic.  Eyes: Conjunctivae and EOM are normal.  Neck: Normal range of motion. Neck supple. No JVD present. Carotid bruit is not present. No thyromegaly present.  Cardiovascular: Normal rate, regular rhythm and normal heart sounds.   No murmur heard. Pulmonary/Chest: Effort normal and breath sounds normal. No respiratory distress. She has no wheezes. She has no rales. She exhibits no tenderness.  Musculoskeletal: She exhibits no edema.  Neurological: She is alert and oriented to person, place, and time.  Psychiatric: She has a normal mood and affect. Her behavior is normal.  Nursing note and vitals reviewed.  BP 120/72 mmHg  Pulse 74  Temp(Src) 99 F (37.2 C) (Oral)  Ht 5\' 7"  (1.702 m)  Wt 280 lb 12.8 oz (127.37 kg)  BMI 43.97 kg/m2  SpO2 98%  LMP 08/07/2015  Breastfeeding? Yes Wt  Readings from Last 3 Encounters:  08/07/15 280 lb 12.8 oz (127.37 kg)  01/20/15 269 lb (122.018 kg)  12/27/14 268 lb 6.4 oz (121.745 kg)     Lab Results  Component Value Date   WBC 10.5 01/20/2015   HGB 14.2 01/20/2015   HCT 43.4 01/20/2015   PLT 358 01/20/2015   GLUCOSE 100*  01/20/2015   CHOL 137 12/27/2014   TRIG 67.0 12/27/2014   HDL 43.80 12/27/2014   LDLCALC 79 12/27/2014   ALT 17 12/27/2014   AST 15 12/27/2014   NA 139 01/20/2015   K 3.4* 01/20/2015   CL 107 01/20/2015   CREATININE 1.02* 01/20/2015   BUN 13 01/20/2015   CO2 25 01/20/2015   TSH 1.41 12/27/2014    Dg Chest 2 View  01/20/2015  CLINICAL DATA:  Left chest pain radiating up into the neck and shoulder. EXAM: CHEST  2 VIEW COMPARISON:  01/04/2005 FINDINGS: The heart size and mediastinal contours are within normal limits. Both lungs are clear. The visualized skeletal structures are unremarkable. IMPRESSION: No active cardiopulmonary disease. Electronically Signed   By: Van Clines M.D.   On: 01/20/2015 21:30     Assessment & Plan:  Plan I am having Ms. Elisabeth Cara maintain her cholecalciferol, ibuprofen, etonogestrel-ethinyl estradiol, buPROPion, amphetamine-dextroamphetamine, and amphetamine-dextroamphetamine.  Meds ordered this encounter  Medications  . amphetamine-dextroamphetamine (ADDERALL XR) 30 MG 24 hr capsule    Sig: Take 1 capsule (30 mg total) by mouth every morning.    Dispense:  90 capsule    Refill:  0    Problem List Items Addressed This Visit    None    Visit Diagnoses    ADD (attention deficit disorder)    -  Primary    Relevant Medications    amphetamine-dextroamphetamine (ADDERALL XR) 30 MG 24 hr capsule     f/u 6 months  Follow-up: Return in about 6 months (around 02/06/2016), or if symptoms worsen or fail to improve, for add.  Ann Held, DO

## 2015-10-28 ENCOUNTER — Other Ambulatory Visit: Payer: Self-pay | Admitting: Family Medicine

## 2015-10-28 DIAGNOSIS — F988 Other specified behavioral and emotional disorders with onset usually occurring in childhood and adolescence: Secondary | ICD-10-CM

## 2015-10-28 MED ORDER — AMPHETAMINE-DEXTROAMPHET ER 30 MG PO CP24: 30.0000 mg | ORAL_CAPSULE | ORAL | 0 refills | Status: DC

## 2015-12-29 ENCOUNTER — Other Ambulatory Visit: Payer: Self-pay | Admitting: Family Medicine

## 2016-01-20 ENCOUNTER — Other Ambulatory Visit: Payer: Self-pay | Admitting: Family Medicine

## 2016-01-20 DIAGNOSIS — F988 Other specified behavioral and emotional disorders with onset usually occurring in childhood and adolescence: Secondary | ICD-10-CM

## 2016-01-21 MED ORDER — AMPHETAMINE-DEXTROAMPHET ER 30 MG PO CP24: 30.0000 mg | ORAL_CAPSULE | ORAL | 0 refills | Status: DC

## 2016-01-21 NOTE — Telephone Encounter (Signed)
Dr. Nani Ravens filling in for PCP  Pt last seen 08/07/15  Last filled #90-0 rf 10/28/15  Last UDS: 08/11/15  QNS  Sig: Take 1 capsule (30 mg total) by mouth every morning.  Patient has appointment  03/02/16 with PCP  Please advise  PC

## 2016-02-06 ENCOUNTER — Ambulatory Visit: Payer: Self-pay | Admitting: Family Medicine

## 2016-02-06 DIAGNOSIS — F411 Generalized anxiety disorder: Secondary | ICD-10-CM | POA: Diagnosis not present

## 2016-03-02 ENCOUNTER — Ambulatory Visit: Payer: Self-pay | Admitting: Family Medicine

## 2016-03-02 ENCOUNTER — Telehealth: Payer: Self-pay | Admitting: Family Medicine

## 2016-03-02 DIAGNOSIS — F411 Generalized anxiety disorder: Secondary | ICD-10-CM | POA: Diagnosis not present

## 2016-03-02 NOTE — Telephone Encounter (Signed)
Pt lvm at 9:40 cancelling/ reschedule her appt. She need to get her insurance situated before she have a visit.   Should pt be charged?

## 2016-03-02 NOTE — Telephone Encounter (Signed)
no

## 2016-03-09 DIAGNOSIS — F411 Generalized anxiety disorder: Secondary | ICD-10-CM | POA: Diagnosis not present

## 2016-03-11 DIAGNOSIS — F411 Generalized anxiety disorder: Secondary | ICD-10-CM | POA: Diagnosis not present

## 2016-03-30 DIAGNOSIS — F411 Generalized anxiety disorder: Secondary | ICD-10-CM | POA: Diagnosis not present

## 2016-04-06 DIAGNOSIS — F411 Generalized anxiety disorder: Secondary | ICD-10-CM | POA: Diagnosis not present

## 2016-04-13 ENCOUNTER — Telehealth: Payer: Self-pay | Admitting: Family Medicine

## 2016-04-13 DIAGNOSIS — F988 Other specified behavioral and emotional disorders with onset usually occurring in childhood and adolescence: Secondary | ICD-10-CM

## 2016-04-13 NOTE — Telephone Encounter (Signed)
Adderall Last fill 01/21/16 Last ov 08/07/15 Next ov 04/27/16 Needs uds and contract- will get at appt on 04/27/16

## 2016-04-13 NOTE — Telephone Encounter (Signed)
Caller name: Relationship to patient: Self Can be reached: [phone] Pharmacy:  Reason for call: Refill on amphetamine-dextroamphetamine (ADDERALL XR) 30 MG 24 hr capsule [id number] Patient has appt scheduled for 04/27/16

## 2016-04-14 NOTE — Telephone Encounter (Signed)
Refill x1 

## 2016-04-15 MED ORDER — AMPHETAMINE-DEXTROAMPHET ER 30 MG PO CP24: 30.0000 mg | ORAL_CAPSULE | ORAL | 0 refills | Status: DC

## 2016-04-15 NOTE — Telephone Encounter (Signed)
Patient notified that rx is ready for pick up

## 2016-04-15 NOTE — Telephone Encounter (Signed)
Awaiting provider signature

## 2016-04-15 NOTE — Addendum Note (Signed)
Addended by: Kem Boroughs D on: 04/15/2016 08:55 AM   Modules accepted: Orders

## 2016-04-27 ENCOUNTER — Ambulatory Visit: Payer: 59 | Admitting: Family Medicine

## 2016-04-28 DIAGNOSIS — F411 Generalized anxiety disorder: Secondary | ICD-10-CM | POA: Diagnosis not present

## 2016-04-29 ENCOUNTER — Ambulatory Visit (INDEPENDENT_AMBULATORY_CARE_PROVIDER_SITE_OTHER): Payer: 59 | Admitting: Family Medicine

## 2016-04-29 ENCOUNTER — Encounter: Payer: Self-pay | Admitting: Family Medicine

## 2016-04-29 DIAGNOSIS — F988 Other specified behavioral and emotional disorders with onset usually occurring in childhood and adolescence: Secondary | ICD-10-CM

## 2016-04-29 DIAGNOSIS — Z79899 Other long term (current) drug therapy: Secondary | ICD-10-CM | POA: Diagnosis not present

## 2016-04-29 NOTE — Progress Notes (Signed)
Pre visit review using our clinic review tool, if applicable. No additional management support is needed unless otherwise documented below in the visit note. 

## 2016-04-29 NOTE — Patient Instructions (Signed)
Take medication as directed and follow up in 6 months

## 2016-04-29 NOTE — Progress Notes (Signed)
Patient ID: Marisa Humphrey, female   DOB: February 10, 1981, 36 y.o.   MRN: [id number]  I acted as a Education administrator for Dr. Carollee Herter.  [name], CMA     Subjective:    Patient ID: Marisa Humphrey, female    DOB: 02-Jul-1980, 36 y.o.   MRN: [id number]  Chief Complaint  Patient presents with  . ADD    HPI  Patient is in today for follow up ADD.  Patient is doing well on current dose.  Past Medical History:  Diagnosis Date  . Anxiety   . Depression   . External hemorrhoids without mention of complication   . GERD (gastroesophageal reflux disease)   . History of condyloma acuminatum   . Hx of varicella   . Postpartum care following vaginal delivery (7/26) 09/23/2013    Past Surgical History:  Procedure Laterality Date  . LAPAROSCOPIC GASTRIC BANDING  11/09/2005   Dr.Martin  . MOLE REMOVAL      Family History  Problem Relation Age of Onset  . Heart attack Father 1  . Heart disease Father 33    MI  . Diabetes Mother   . Hypertension Mother   . Hyperlipidemia Mother   . Cancer Other     breast  . Diabetes Maternal Grandmother   . Multiple sclerosis Brother     Social History   Social History  . Marital status: Married    Spouse name: N/A  . Number of children: 1  . Years of education: N/A   Occupational History  . nurse [place] History Main Topics  . Smoking status: Former Smoker    Packs/day: 0.50    Years: 16.00    Types: Cigarettes  . Smokeless tobacco: Never Used  . Alcohol use No  . Drug use: No  . Sexual activity: Yes    Partners: Male   Other Topics Concern  . Not on file   Social History Narrative   Exercising --no    Outpatient Medications Prior to Visit  Medication Sig Dispense Refill  . amphetamine-dextroamphetamine (ADDERALL XR) 30 MG 24 hr capsule Take 1 capsule (30 mg total) by mouth every morning. 90 capsule 0  . buPROPion (WELLBUTRIN XL) 300 MG 24 hr tablet TAKE 1 TABLET BY MOUTH ONCE DAILY 90  tablet 1  . cholecalciferol (VITAMIN D) 1000 UNITS tablet Take 2,000 Units by mouth daily.    Marland Kitchen ibuprofen (ADVIL,MOTRIN) 600 MG tablet Take 1 tablet (600 mg total) by mouth every 6 (six) hours. (Patient taking differently: Take 600 mg by mouth every 6 (six) hours as needed. ) 30 tablet 0  . etonogestrel-ethinyl estradiol (NUVARING) 0.12-0.015 MG/24HR vaginal ring Place 1 each vaginally every 28 (twenty-eight) days. Insert vaginally and leave in place for 3 consecutive weeks, then remove for 1 week.     No facility-administered medications prior to visit.     Allergies  Allergen Reactions  . Penicillins Rash    Amoxil has been taken w/o reaction    Review of Systems  Constitutional: Negative for fever and malaise/fatigue.  HENT: Negative for congestion.   Eyes: Negative for blurred vision.  Respiratory: Negative for cough and shortness of breath.   Cardiovascular: Negative for chest pain, palpitations and leg swelling.  Gastrointestinal: Negative for vomiting.  Musculoskeletal: Negative for back pain.  Skin: Negative for rash.  Neurological: Negative for loss of consciousness and headaches.       Objective:    Physical  Exam  Constitutional: She is oriented to person, place, and time. She appears well-developed and well-nourished. No distress.  HENT:  Head: Normocephalic and atraumatic.  Eyes: Conjunctivae are normal.  Neck: Normal range of motion. No thyromegaly present.  Cardiovascular: Normal rate and regular rhythm.   Pulmonary/Chest: Effort normal and breath sounds normal. She has no wheezes.  Abdominal: Soft. Bowel sounds are normal. There is no tenderness.  Musculoskeletal: Normal range of motion. She exhibits no edema or deformity.  Neurological: She is alert and oriented to person, place, and time.  Skin: Skin is warm and dry. She is not diaphoretic.  Psychiatric: She has a normal mood and affect.    BP 126/88 (BP Location: Left Arm, Cuff Size: Large)   Pulse 82    Temp 98.8 F (37.1 C) (Oral)   Resp 16   Ht 5\' 7"  (1.702 m)   Wt 283 lb 9.6 oz (128.6 kg)   LMP 04/29/2016   SpO2 98%   BMI 44.42 kg/m  Wt Readings from Last 3 Encounters:  04/29/16 283 lb 9.6 oz (128.6 kg)  08/07/15 280 lb 12.8 oz (127.4 kg)  01/20/15 269 lb (122 kg)     Lab Results  Component Value Date   WBC 10.5 01/20/2015   HGB 14.2 01/20/2015   HCT 43.4 01/20/2015   PLT 358 01/20/2015   GLUCOSE 100 (H) 01/20/2015   CHOL 137 12/27/2014   TRIG 67.0 12/27/2014   HDL 43.80 12/27/2014   LDLCALC 79 12/27/2014   ALT 17 12/27/2014   AST 15 12/27/2014   NA 139 01/20/2015   K 3.4 (L) 01/20/2015   CL 107 01/20/2015   CREATININE 1.02 (H) 01/20/2015   BUN 13 01/20/2015   CO2 25 01/20/2015   TSH 1.41 12/27/2014    Lab Results  Component Value Date   TSH 1.41 12/27/2014   Lab Results  Component Value Date   WBC 10.5 01/20/2015   HGB 14.2 01/20/2015   HCT 43.4 01/20/2015   MCV 88.9 01/20/2015   PLT 358 01/20/2015   Lab Results  Component Value Date   NA 139 01/20/2015   K 3.4 (L) 01/20/2015   CO2 25 01/20/2015   GLUCOSE 100 (H) 01/20/2015   BUN 13 01/20/2015   CREATININE 1.02 (H) 01/20/2015   BILITOT 0.4 12/27/2014   ALKPHOS 69 12/27/2014   AST 15 12/27/2014   ALT 17 12/27/2014   PROT 7.1 12/27/2014   ALBUMIN 3.9 12/27/2014   CALCIUM 9.2 01/20/2015   ANIONGAP 7 01/20/2015   GFR 72.14 12/27/2014   Lab Results  Component Value Date   CHOL 137 12/27/2014   Lab Results  Component Value Date   HDL 43.80 12/27/2014   Lab Results  Component Value Date   LDLCALC 79 12/27/2014   Lab Results  Component Value Date   TRIG 67.0 12/27/2014   Lab Results  Component Value Date   CHOLHDL 3 12/27/2014   No results found for: HGBA1C     Assessment & Plan:   Problem List Items Addressed This Visit    None    Visit Diagnoses    Attention deficit disorder, unspecified hyperactivity presence        rx was up front for 90 days supply rto 6 months or  sooner prn   I have discontinued Ms. Solomon's etonogestrel-ethinyl estradiol. I am also having her maintain her cholecalciferol, ibuprofen, buPROPion, and amphetamine-dextroamphetamine.  No orders of the defined types were placed in this encounter.   CMA  served as Education administrator during this visit. History, Physical and Plan performed by medical provider. Documentation and orders reviewed and attested to.  Ann Held, DO

## 2016-05-05 DIAGNOSIS — F411 Generalized anxiety disorder: Secondary | ICD-10-CM | POA: Diagnosis not present

## 2016-05-12 DIAGNOSIS — F411 Generalized anxiety disorder: Secondary | ICD-10-CM | POA: Diagnosis not present

## 2016-05-19 DIAGNOSIS — F411 Generalized anxiety disorder: Secondary | ICD-10-CM | POA: Diagnosis not present

## 2016-06-01 ENCOUNTER — Telehealth: Payer: Self-pay

## 2016-06-01 NOTE — Telephone Encounter (Signed)
Spoke with patient about uds results, patient state she understand results. Patient's uds for adderrall / amphetamine came back not detected. Patient report the day (04/29/16) she provided a urine sample, she have not taking medication for about 1.5 weeks. Patient also report day of urine sample her urine was diluted. Patient report she takes mediation once daily. LB

## 2016-07-26 ENCOUNTER — Other Ambulatory Visit: Payer: Self-pay | Admitting: Family Medicine

## 2016-07-26 DIAGNOSIS — F988 Other specified behavioral and emotional disorders with onset usually occurring in childhood and adolescence: Secondary | ICD-10-CM

## 2016-07-29 ENCOUNTER — Other Ambulatory Visit: Payer: Self-pay | Admitting: Family Medicine

## 2016-07-29 DIAGNOSIS — F988 Other specified behavioral and emotional disorders with onset usually occurring in childhood and adolescence: Secondary | ICD-10-CM

## 2016-07-29 MED ORDER — AMPHETAMINE-DEXTROAMPHET ER 30 MG PO CP24: 30.0000 mg | ORAL_CAPSULE | ORAL | 0 refills | Status: DC

## 2016-07-29 NOTE — Telephone Encounter (Signed)
Caller name: Relationship to patient: Self Can be reached: Pharmacy:  Reason for call: Patient calling to find out status of request for refill amphetamine-dextroamphetamine (ADDERALL XR) 30 MG 24 hr capsule [[id number]

## 2016-07-29 NOTE — Telephone Encounter (Signed)
Refill done/printed and patient notified through [employer]

## 2016-07-30 ENCOUNTER — Encounter: Payer: Self-pay | Admitting: Family Medicine

## 2016-07-30 DIAGNOSIS — Z79899 Other long term (current) drug therapy: Secondary | ICD-10-CM | POA: Diagnosis not present

## 2016-08-18 ENCOUNTER — Telehealth: Payer: Self-pay | Admitting: *Deleted

## 2016-08-18 NOTE — Telephone Encounter (Signed)
Patient picks up 90 day rxs for Adderall every 2-3 months and UDS always negative.   Database shows she picks up from pharmacy.  Need to advise patient.  What is going on?

## 2016-08-18 NOTE — Telephone Encounter (Signed)
Left message on machine to call back  

## 2016-09-03 NOTE — Telephone Encounter (Signed)
Patient has a newer drug that's on file and it is in her system.  The UDS was from 04/29/16 was neg because she was out for a few weeks.

## 2016-09-13 ENCOUNTER — Other Ambulatory Visit: Payer: Self-pay | Admitting: Family Medicine

## 2016-09-29 DIAGNOSIS — Z1159 Encounter for screening for other viral diseases: Secondary | ICD-10-CM | POA: Diagnosis not present

## 2016-09-29 DIAGNOSIS — Z01419 Encounter for gynecological examination (general) (routine) without abnormal findings: Secondary | ICD-10-CM | POA: Diagnosis not present

## 2016-09-29 DIAGNOSIS — Z6841 Body Mass Index (BMI) 40.0 and over, adult: Secondary | ICD-10-CM | POA: Diagnosis not present

## 2016-09-29 DIAGNOSIS — Z113 Encounter for screening for infections with a predominantly sexual mode of transmission: Secondary | ICD-10-CM | POA: Diagnosis not present

## 2016-09-29 DIAGNOSIS — Z114 Encounter for screening for human immunodeficiency virus [HIV]: Secondary | ICD-10-CM | POA: Diagnosis not present

## 2016-10-27 ENCOUNTER — Telehealth: Payer: Self-pay | Admitting: Family Medicine

## 2016-10-27 DIAGNOSIS — F988 Other specified behavioral and emotional disorders with onset usually occurring in childhood and adolescence: Secondary | ICD-10-CM

## 2016-10-27 NOTE — Telephone Encounter (Signed)
Pt is requesting refill on Adderall XR 30mg .  Last OV: 04/29/2016 Last Fill: 07/29/2016 #90 and 0RF (90 day supply) UDS: 07/29/2016 Low risk  [facility] database printed; placed on [facility] for tomorrow   Please advise.

## 2016-10-28 ENCOUNTER — Ambulatory Visit (INDEPENDENT_AMBULATORY_CARE_PROVIDER_SITE_OTHER): Payer: 59 | Admitting: Family Medicine

## 2016-10-28 ENCOUNTER — Encounter: Payer: Self-pay | Admitting: Family Medicine

## 2016-10-28 VITALS — BP 118/82 | HR 75 | Temp 98.5°F | Ht 67.0 in | Wt 279.8 lb

## 2016-10-28 DIAGNOSIS — F325 Major depressive disorder, single episode, in full remission: Secondary | ICD-10-CM

## 2016-10-28 DIAGNOSIS — F988 Other specified behavioral and emotional disorders with onset usually occurring in childhood and adolescence: Secondary | ICD-10-CM

## 2016-10-28 MED ORDER — AMPHETAMINE-DEXTROAMPHET ER 30 MG PO CP24: 30.0000 mg | ORAL_CAPSULE | ORAL | 0 refills | Status: DC

## 2016-10-28 NOTE — Telephone Encounter (Signed)
Refill 3 months Database reviewed and scanned in

## 2016-10-28 NOTE — Assessment & Plan Note (Signed)
Refill adderall  rto 6 months

## 2016-10-28 NOTE — Telephone Encounter (Signed)
Rx printed and forwarded to pcp for review and signature.

## 2016-10-28 NOTE — Assessment & Plan Note (Signed)
con't wellbutrin  rto 6 months stable

## 2016-10-28 NOTE — Patient Instructions (Signed)

## 2016-10-28 NOTE — Progress Notes (Signed)
Patient ID: Marisa Humphrey, female    DOB: October 05, 1980  Age: 36 y.o. MRN: [id number]    Subjective:  Subjective  HPI Marisa Humphrey presents for f/u add.  No complaints.   Review of Systems  Constitutional: Negative for appetite change, diaphoresis, fatigue and unexpected weight change.  Eyes: Negative for pain, redness and visual disturbance.  Respiratory: Negative for cough, chest tightness, shortness of breath and wheezing.   Cardiovascular: Negative for chest pain, palpitations and leg swelling.  Endocrine: Negative for cold intolerance, heat intolerance, polydipsia, polyphagia and polyuria.  Genitourinary: Negative for difficulty urinating, dysuria and frequency.  Neurological: Negative for dizziness, light-headedness, numbness and headaches.    History Past Medical History:  Diagnosis Date  . Anxiety   . Depression   . External hemorrhoids without mention of complication   . GERD (gastroesophageal reflux disease)   . History of condyloma acuminatum   . Hx of varicella   . Postpartum care following vaginal delivery (7/26) 09/23/2013    She has a past surgical history that includes Laparoscopic gastric banding (11/09/2005) and Mole removal.   Her family history includes Cancer in her other; Diabetes in her maternal grandmother and mother; Heart attack (age of onset: 60) in her father; Heart disease (age of onset: 49) in her father; Hyperlipidemia in her mother; Hypertension in her mother; Multiple sclerosis in her brother.She reports that she has quit smoking. Her smoking use included Cigarettes. She has a 8.00 pack-year smoking history. She has never used smokeless tobacco. She reports that she does not drink alcohol or use drugs.  Current Outpatient Prescriptions on File Prior to Visit  Medication Sig Dispense Refill  . amphetamine-dextroamphetamine (ADDERALL XR) 30 MG 24 hr capsule Take 1 capsule (30 mg total) by mouth every morning. 90 capsule 0  . buPROPion (WELLBUTRIN XL)  300 MG 24 hr tablet TAKE 1 TABLET BY MOUTH ONCE DAILY 90 tablet 0  . cholecalciferol (VITAMIN D) 1000 UNITS tablet Take 2,000 Units by mouth daily.    Marland Kitchen ibuprofen (ADVIL,MOTRIN) 600 MG tablet Take 1 tablet (600 mg total) by mouth every 6 (six) hours. (Patient taking differently: Take 600 mg by mouth every 6 (six) hours as needed. ) 30 tablet 0   No current facility-administered medications on file prior to visit.      Objective:  Objective  Physical Exam  Constitutional: She is oriented to person, place, and time. She appears well-developed and well-nourished.  HENT:  Head: Normocephalic and atraumatic.  Eyes: Conjunctivae and EOM are normal.  Neck: Normal range of motion. Neck supple. No JVD present. Carotid bruit is not present. No thyromegaly present.  Cardiovascular: Normal rate, regular rhythm and normal heart sounds.   No murmur heard. Pulmonary/Chest: Effort normal and breath sounds normal. No respiratory distress. She has no wheezes. She has no rales. She exhibits no tenderness.  Musculoskeletal: She exhibits no edema.  Neurological: She is alert and oriented to person, place, and time.  Psychiatric: She has a normal mood and affect. Her behavior is normal. Judgment and thought content normal.  Nursing note and vitals reviewed.  BP 118/82 (BP Location: Right Arm, Patient Position: Sitting, Cuff Size: Large)   Pulse 75   Temp 98.5 F (36.9 C) (Oral)   Ht 5\' 7"  (1.702 m)   Wt 279 lb 12.8 oz (126.9 kg)   LMP 10/25/2016   SpO2 99%   BMI 43.82 kg/m  Wt Readings from Last 3 Encounters:  10/28/16 279 lb 12.8 oz (126.9 kg)  04/29/16 283 lb 9.6 oz (128.6 kg)  08/07/15 280 lb 12.8 oz (127.4 kg)     Lab Results  Component Value Date   WBC 10.5 01/20/2015   HGB 14.2 01/20/2015   HCT 43.4 01/20/2015   PLT 358 01/20/2015   GLUCOSE 100 (H) 01/20/2015   CHOL 137 12/27/2014   TRIG 67.0 12/27/2014   HDL 43.80 12/27/2014   LDLCALC 79 12/27/2014   ALT 17 12/27/2014   AST 15  12/27/2014   NA 139 01/20/2015   K 3.4 (L) 01/20/2015   CL 107 01/20/2015   CREATININE 1.02 (H) 01/20/2015   BUN 13 01/20/2015   CO2 25 01/20/2015   TSH 1.41 12/27/2014    Dg Chest 2 View  Result Date: 01/20/2015 CLINICAL DATA:  Left chest pain radiating up into the neck and shoulder. EXAM: CHEST  2 VIEW COMPARISON:  01/04/2005 FINDINGS: The heart size and mediastinal contours are within normal limits. Both lungs are clear. The visualized skeletal structures are unremarkable. IMPRESSION: No active cardiopulmonary disease. Electronically Signed   By: Van Clines M.D.   On: 01/20/2015 21:30     Assessment & Plan:  Plan  I am having [name] maintain her cholecalciferol, ibuprofen, buPROPion, and amphetamine-dextroamphetamine.  No orders of the defined types were placed in this encounter.   Problem List Items Addressed This Visit      Unprioritized   Attention deficit 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 - Primary    Refill adderall  rto 6 months      Depression, major, single episode, complete remission ([place])    con't wellbutrin  rto 6 months stable         Follow-up: Return in about 6 months (around 04/28/2017), or if symptoms worsen or fail to improve.  Ann Held, DO

## 2016-10-29 NOTE — Telephone Encounter (Signed)
Pt received Rx yesterday during appt.

## 2016-12-15 ENCOUNTER — Ambulatory Visit: Payer: 59 | Admitting: Medical

## 2017-01-24 ENCOUNTER — Other Ambulatory Visit: Payer: Self-pay | Admitting: Family Medicine

## 2017-01-24 DIAGNOSIS — F988 Other specified behavioral and emotional disorders with onset usually occurring in childhood and adolescence: Secondary | ICD-10-CM

## 2017-01-26 MED ORDER — BUPROPION HCL ER (XL) 300 MG PO TB24: 300.0000 mg | ORAL_TABLET | Freq: Every day | ORAL | 0 refills | Status: DC

## 2017-01-26 NOTE — Telephone Encounter (Signed)
Last Adderall RX: 10/28/16, #90 Last OV: 10/28/16 Next OV: due 04/28/17 but not yet scheduled UDS: 07/29/16, low. Due 11/31/18 CSC: 04/29/16  My access to CS registry is pending.  Please advise?

## 2017-01-27 ENCOUNTER — Other Ambulatory Visit: Payer: Self-pay | Admitting: *Deleted

## 2017-01-27 DIAGNOSIS — Z79899 Other long term (current) drug therapy: Secondary | ICD-10-CM

## 2017-01-27 MED ORDER — AMPHETAMINE-DEXTROAMPHET ER 30 MG PO CP24: 30.0000 mg | ORAL_CAPSULE | ORAL | 0 refills | Status: DC

## 2017-01-27 NOTE — Telephone Encounter (Signed)
mychart message sent.  Patient needs uds

## 2017-01-27 NOTE — Telephone Encounter (Signed)
Need uds

## 2017-01-28 ENCOUNTER — Other Ambulatory Visit: Payer: 59

## 2017-01-28 DIAGNOSIS — Z79899 Other long term (current) drug therapy: Secondary | ICD-10-CM

## 2017-01-31 LAB — PAIN MGMT, PROFILE 8 W/CONF, U
6 ACETYLMORPHINE: NEGATIVE ng/mL (ref <10)
AMPHETAMINE: 1305 ng/mL — AB (ref <250)
Alcohol Metabolites: NEGATIVE ng/mL (ref <500)
Amphetamines: POSITIVE ng/mL — AB (ref <500)
Benzodiazepines: NEGATIVE ng/mL (ref <100)
Buprenorphine, Urine: NEGATIVE ng/mL (ref <5)
Cocaine Metabolite: NEGATIVE ng/mL (ref <150)
Creatinine: 10.5 mg/dL — ABNORMAL LOW
MARIJUANA METABOLITE: NEGATIVE ng/mL (ref <20)
MDMA: NEGATIVE ng/mL (ref <500)
Methamphetamine: NEGATIVE ng/mL (ref <250)
OPIATES: NEGATIVE ng/mL (ref <100)
Oxidant: NEGATIVE ug/mL (ref <200)
Oxycodone: NEGATIVE ng/mL (ref <100)
Specific Gravity: 1.002 — ABNORMAL LOW (ref >=1.0)
pH: 7.17 (ref 4.5–9.0)

## 2017-04-21 ENCOUNTER — Ambulatory Visit (INDEPENDENT_AMBULATORY_CARE_PROVIDER_SITE_OTHER): Payer: Self-pay | Admitting: Family Medicine

## 2017-04-21 ENCOUNTER — Telehealth: Payer: Self-pay

## 2017-04-21 VITALS — BP 124/82 | HR 89 | Temp 98.4°F | Resp 18

## 2017-04-21 DIAGNOSIS — J029 Acute pharyngitis, unspecified: Secondary | ICD-10-CM

## 2017-04-21 MED ORDER — DOXYCYCLINE HYCLATE 100 MG PO CAPS
100.0000 mg | ORAL_CAPSULE | Freq: Two times a day (BID) | ORAL | 0 refills | Status: DC
Start: 2017-04-21 — End: 2017-08-08

## 2017-04-21 NOTE — Progress Notes (Signed)
Subjective:  Marisa Humphrey is a 37 y.o. female who presents for evaluation of possible streptococcal infection..  Symptoms include sore throat and fatigue..  Onset of symptoms was 2 days ago, and has been gradually worsening since that time.  Treatment to date:  rest.  High risk factors for influenza complications:  none.  The following portions of the patient's history were reviewed and updated as appropriate:  allergies, current medications and past medical history.  Pertinent items are noted in HPI. Objective:  BP 124/82 (BP Location: Right Arm, Patient Position: Sitting, Cuff Size: Normal)   Pulse 89   Temp 98.4 F (36.9 C) (Oral)   Resp 18   SpO2 98%  General appearance: alert, cooperative and ill-appearing  Nose: Nares normal. Septum midline. Mucosa normal. No drainage or sinus tenderness. Throat: abnormal findings: exudates present Lungs: clear to auscultation bilaterally Heart: regular rate and rhythm, S1, S2 normal, no murmur, click, rub or gallop Lymph nodes: Cervical adenopathy: enlarged and present.    Assessment:  1. Pharyngitis, unspecified etiology - POCT rapid strep A, negative. 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   Plan:  Return for follow-up if symptoms worsen or do not improve.  Meds ordered this encounter  Medications  . doxycycline (VIBRAMYCIN) 100 MG capsule    Sig: Take 1 capsule (100 mg total) by mouth 2 (two) times daily.    Dispense:  20 capsule    Refill:  0    Take ibuprofen 400 mg every 6 hours as needed for pain.   Carroll Sage. Kenton Kingfisher, MSN, FNP-C [address] [place]  [place], [place] [address] [phone]

## 2017-04-21 NOTE — Patient Instructions (Signed)
Start Doxycyline 100 mg twice daily x 10 days.  Recommend warm salt water rinses.   For pain, take ibuprofen 400 mg every 6 hours as needed for pain.  Return for care if symptoms worsen or do not improve.     Pharyngitis Pharyngitis is a sore throat (pharynx). There is redness, pain, and swelling of your throat. Follow these instructions at home:  Drink enough fluids to keep your pee (urine) clear or pale yellow.  Only take medicine as told by your doctor. ? You may get sick again if you do not take medicine as told. Finish your medicines, even if you start to feel better. ? Do not take aspirin.  Rest.  Rinse your mouth (gargle) with salt water ( tsp of salt per 1 qt of water) every 1-2 hours. This will help the pain.  If you are not at risk for choking, you can suck on hard candy or sore throat lozenges. Contact a doctor if:  You have large, tender lumps on your neck.  You have a rash.  You cough up green, yellow-brown, or bloody spit. Get help right away if:  You have a stiff neck.  You drool or cannot swallow liquids.  You throw up (vomit) or are not able to keep medicine or liquids down.  You have very bad pain that does not go away with medicine.  You have problems breathing (not from a stuffy nose). This information is not intended to replace advice given to you by your health care provider. Make sure you discuss any questions you have with your health care provider. Document Released: 08/04/2007 Document Revised: 07/24/2015 Document Reviewed: 10/23/2012 Elsevier Interactive Patient Education  2017 [employer].

## 2017-04-25 ENCOUNTER — Ambulatory Visit (INDEPENDENT_AMBULATORY_CARE_PROVIDER_SITE_OTHER): Payer: No Typology Code available for payment source | Admitting: Family Medicine

## 2017-04-25 ENCOUNTER — Encounter: Payer: Self-pay | Admitting: Family Medicine

## 2017-04-25 VITALS — BP 130/86 | HR 105 | Temp 98.5°F | Resp 16 | Ht 67.0 in | Wt 279.2 lb

## 2017-04-25 DIAGNOSIS — F325 Major depressive disorder, single episode, in full remission: Secondary | ICD-10-CM | POA: Diagnosis not present

## 2017-04-25 DIAGNOSIS — F988 Other specified behavioral and emotional disorders with onset usually occurring in childhood and adolescence: Secondary | ICD-10-CM

## 2017-04-25 DIAGNOSIS — F321 Major depressive disorder, single episode, moderate: Secondary | ICD-10-CM | POA: Diagnosis not present

## 2017-04-25 MED ORDER — SERTRALINE HCL 50 MG PO TABS: 50.0000 mg | ORAL_TABLET | Freq: Every day | ORAL | 3 refills | Status: DC

## 2017-04-25 MED ORDER — AMPHETAMINE-DEXTROAMPHET ER 30 MG PO CP24: 30.0000 mg | ORAL_CAPSULE | ORAL | 0 refills | Status: DC

## 2017-04-25 NOTE — Assessment & Plan Note (Signed)
Pt needs f/u with Dr Hassell Done for lab band

## 2017-04-25 NOTE — Progress Notes (Signed)
Patient ID: Marisa Humphrey, female    DOB: Sep 07, 1980  Age: 37 y.o. MRN: [id number]    Subjective:  Subjective  HPI DARNELL STIMSON presents for f/u add and she c/o being depressed   She is having marital problems   Review of Systems  Constitutional: Positive for fatigue. Negative for activity change, appetite change and unexpected weight change.  Respiratory: Negative for cough and shortness of breath.   Cardiovascular: Negative for chest pain and palpitations.  Psychiatric/Behavioral: Positive for dysphoric mood. Negative for behavioral problems, self-injury and suicidal ideas. The patient is nervous/anxious.     History Past Medical History:  Diagnosis Date  . Anxiety   . Depression   . External hemorrhoids without mention of complication   . GERD (gastroesophageal reflux disease)   . History of condyloma acuminatum   . Hx of varicella   . Postpartum care following vaginal delivery (7/26) 09/23/2013    She has a past surgical history that includes Laparoscopic gastric banding (11/09/2005) and Mole removal.   Her family history includes Cancer in her other; Diabetes in her maternal grandmother and mother; Heart attack (age of onset: 6) in her father; Heart disease (age of onset: 16) in her father; Hyperlipidemia in her mother; Hypertension in her mother; Multiple sclerosis in her brother.She reports that she has quit smoking. Her smoking use included cigarettes. She has a 8.00 pack-year smoking history. she has never used smokeless tobacco. She reports that she does not drink alcohol or use drugs.  Current Outpatient Medications on File Prior to Visit  Medication Sig Dispense Refill  . buPROPion (WELLBUTRIN XL) 300 MG 24 hr tablet Take 1 tablet (300 mg total) by mouth daily. 90 tablet 0  . cholecalciferol (VITAMIN D) 1000 UNITS tablet Take 2,000 Units by mouth daily.    Marland Kitchen doxycycline (VIBRAMYCIN) 100 MG capsule Take 1 capsule (100 mg total) by mouth 2 (two) times daily. 20 capsule  0  . ibuprofen (ADVIL,MOTRIN) 600 MG tablet Take 1 tablet (600 mg total) by mouth every 6 (six) hours. (Patient taking differently: Take 600 mg by mouth every 6 (six) hours as needed. ) 30 tablet 0   No current facility-administered medications on file prior to visit.      Objective:  Objective  Physical Exam  Constitutional: She is oriented to person, place, and time. She appears well-developed and well-nourished.  HENT:  Head: Normocephalic and atraumatic.  Eyes: Conjunctivae and EOM are normal.  Neck: Normal range of motion. Neck supple. No JVD present. Carotid bruit is not present. No thyromegaly present.  Cardiovascular: Normal rate, regular rhythm and normal heart sounds.  No murmur heard. Pulmonary/Chest: Effort normal and breath sounds normal. No respiratory distress. She has no wheezes. She has no rales. She exhibits no tenderness.  Musculoskeletal: She exhibits no edema.  Neurological: She is alert and oriented to person, place, and time.  Psychiatric: Her speech is normal and behavior is normal. Judgment and thought content normal. Her mood appears anxious. Her affect is not angry, not blunt, not labile and not inappropriate. Thought content is not paranoid and not delusional. Cognition and memory are normal. She exhibits a depressed mood. She expresses no homicidal and no suicidal ideation. She expresses no suicidal plans and no homicidal plans.  Nursing note and vitals reviewed.  BP 130/86 (BP Location: Left Arm, Cuff Size: Large)   Pulse (!) 105   Temp 98.5 F (36.9 C) (Oral)   Resp 16   Ht 5\' 7"  (1.702 m)  Wt 279 lb 3.2 oz (126.6 kg)   SpO2 99%   BMI 43.73 kg/m  Wt Readings from Last 3 Encounters:  04/25/17 279 lb 3.2 oz (126.6 kg)  10/28/16 279 lb 12.8 oz (126.9 kg)  04/29/16 283 lb 9.6 oz (128.6 kg)     Lab Results  Component Value Date   WBC 10.5 01/20/2015   HGB 14.2 01/20/2015   HCT 43.4 01/20/2015   PLT 358 01/20/2015   GLUCOSE 100 (H) 01/20/2015    CHOL 137 12/27/2014   TRIG 67.0 12/27/2014   HDL 43.80 12/27/2014   LDLCALC 79 12/27/2014   ALT 17 12/27/2014   AST 15 12/27/2014   NA 139 01/20/2015   K 3.4 (L) 01/20/2015   CL 107 01/20/2015   CREATININE 1.02 (H) 01/20/2015   BUN 13 01/20/2015   CO2 25 01/20/2015   TSH 1.41 12/27/2014    Dg Chest 2 View  Result Date: 01/20/2015 CLINICAL DATA:  Left chest pain radiating up into the neck and shoulder. EXAM: CHEST  2 VIEW COMPARISON:  01/04/2005 FINDINGS: The heart size and mediastinal contours are within normal limits. Both lungs are clear. The visualized skeletal structures are unremarkable. IMPRESSION: No active cardiopulmonary disease. Electronically Signed   By: Van Clines M.D.   On: 01/20/2015 21:30     Assessment & Plan:  Plan  I am having Lauralee Evener start on sertraline. I am also having her maintain her cholecalciferol, ibuprofen, buPROPion, doxycycline, and amphetamine-dextroamphetamine.  Meds ordered this encounter  Medications  . sertraline (ZOLOFT) 50 MG tablet    Sig: Take 1 tablet (50 mg total) by mouth daily.    Dispense:  30 tablet    Refill:  3  . amphetamine-dextroamphetamine (ADDERALL XR) 30 MG 24 hr capsule    Sig: Take 1 capsule (30 mg total) by mouth every morning.    Dispense:  90 capsule    Refill:  0    Problem List Items Addressed This Visit      Unprioritized   Depression, major, single episode, complete remission ([place])    Add zoloft to wellbutrin F/u 1 month or sooner prn Pt given number for counselor here at the office       Relevant Medications   sertraline (ZOLOFT) 50 MG tablet   Depression, major, single episode, moderate ([place]) - Primary   Relevant Medications   sertraline (ZOLOFT) 50 MG tablet   Morbid obesity ([place]) (Chronic)    Pt needs f/u with Dr Hassell Done for lab band      Relevant Medications   amphetamine-dextroamphetamine (ADDERALL XR) 30 MG 24 hr capsule   Other Relevant Orders   Ambulatory referral to  General Surgery    Other Visit Diagnoses    Attention deficit disorder, unspecified hyperactivity presence       Relevant Medications   amphetamine-dextroamphetamine (ADDERALL XR) 30 MG 24 hr capsule      Follow-up: Return in about 6 months (around 10/23/2017), or if symptoms worsen or fail to improve.  Ann Held, DO

## 2017-04-25 NOTE — Patient Instructions (Signed)

## 2017-04-25 NOTE — Progress Notes (Deleted)
Patient ID: Marisa Humphrey, female   DOB: 1980/03/13, 37 y.o.   MRN: [id number]    Subjective:  I acted as a Education administrator for Dr. Carollee Herter.  [name], [place]   Patient ID: Marisa Humphrey, female    DOB: [birth year], 37 y.o.   MRN: [id number]  Chief Complaint  Patient presents with  . ADD  . Depression     HPI  Patient is in today for follow up ADD and depression.  She is feeling tired, does not have the drive to do anything, tearful, anxiety.  Depression screen PHQ 2/9 04/25/2017  Decreased Interest 1  Down, Depressed, Hopeless 3  PHQ - 2 Score 4  Altered sleeping 0  Tired, decreased energy 3  Change in appetite 2  Trouble concentrating 3  Moving slowly or fidgety/restless 0  Suicidal thoughts 0  PHQ-9 Score 12  Difficult doing work/chores Not difficult at all     Patient Care Team: Carollee Herter, Alferd Apa, DO as PCP - General Aloha Gell, MD as Consulting Physician (Obstetrics and Gynecology)   Past Medical History:  Diagnosis Date  . Anxiety   . Depression   . External hemorrhoids without mention of complication   . GERD (gastroesophageal reflux disease)   . History of condyloma acuminatum   . Hx of varicella   . Postpartum care following vaginal delivery (7/26) 09/23/2013    Past Surgical History:  Procedure Laterality Date  . LAPAROSCOPIC GASTRIC BANDING  11/09/2005   Dr.Martin  . MOLE REMOVAL      Family History  Problem Relation Age of Onset  . Heart attack Father 14  . Heart disease Father 84       MI  . Diabetes Mother   . Hypertension Mother   . Hyperlipidemia Mother   . Cancer Other        breast  . Diabetes Maternal Grandmother   . Multiple sclerosis Brother     Social History   Socioeconomic History  . Marital status: Married    Spouse name: Not on file  . Number of children: 1  . Years of education: Not on file  . Highest education level: Not on file  Social Needs  . Financial resource strain: Not on file  . Food insecurity - worry:  Not on file  . Food insecurity - inability: Not on file  . Transportation needs - medical: Not on file  . Transportation needs - non-medical: Not on file  Occupational History  . Occupation: Optician, dispensing: [employer]    Employer: [employer]  Tobacco Use  . Smoking status: Former Smoker    Packs/day: 0.50    Years: 16.00    Pack years: 8.00    Types: Cigarettes  . Smokeless tobacco: Never Used  Substance and Sexual Activity  . Alcohol use: No  . Drug use: No  . Sexual activity: Yes    Partners: Male  Other Topics Concern  . Not on file  Social History Narrative   Exercising --no    Outpatient Medications Prior to Visit  Medication Sig Dispense Refill  . amphetamine-dextroamphetamine (ADDERALL XR) 30 MG 24 hr capsule Take 1 capsule (30 mg total) by mouth every morning. 90 capsule 0  . buPROPion (WELLBUTRIN XL) 300 MG 24 hr tablet Take 1 tablet (300 mg total) by mouth daily. 90 tablet 0  . cholecalciferol (VITAMIN D) 1000 UNITS tablet Take 2,000 Units by mouth daily.    Marland Kitchen doxycycline (VIBRAMYCIN) 100  MG capsule Take 1 capsule (100 mg total) by mouth 2 (two) times daily. 20 capsule 0  . ibuprofen (ADVIL,MOTRIN) 600 MG tablet Take 1 tablet (600 mg total) by mouth every 6 (six) hours. (Patient taking differently: Take 600 mg by mouth every 6 (six) hours as needed. ) 30 tablet 0   No facility-administered medications prior to visit.     Allergies  Allergen Reactions  . Penicillins Rash    Amoxil has been taken w/o reaction    Review of Systems  Constitutional: Positive for malaise/fatigue. Negative for fever.  HENT: Negative for congestion.   Eyes: Negative for blurred vision.  Respiratory: Negative for cough and shortness of breath.   Cardiovascular: Negative for chest pain, palpitations and leg swelling.  Gastrointestinal: Negative for vomiting.  Musculoskeletal: Negative for back pain.  Skin: Negative for rash.  Neurological: Negative for loss of  consciousness and headaches.  Psychiatric/Behavioral: Positive for depression. The patient is nervous/anxious (anxiety).        Objective:    Physical Exam  BP 130/86 (BP Location: Left Arm, Cuff Size: Large)   Pulse (!) 105   Temp 98.5 F (36.9 C) (Oral)   Resp 16   Ht 5\' 7"  (1.702 m)   Wt 279 lb 3.2 oz (126.6 kg)   SpO2 99%   BMI 43.73 kg/m  Wt Readings from Last 3 Encounters:  04/25/17 279 lb 3.2 oz (126.6 kg)  10/28/16 279 lb 12.8 oz (126.9 kg)  04/29/16 283 lb 9.6 oz (128.6 kg)   BP Readings from Last 3 Encounters:  04/25/17 130/86  04/21/17 124/82  10/28/16 118/82     Immunization History  Administered Date(s) Administered  . Influenza Whole 12/21/2006  . Influenza-Unspecified 12/06/2014  . Td 09/12/2002  . Tdap 07/17/2013    Health Maintenance  Topic Date Due  . INFLUENZA VACCINE  09/29/2016  . PAP SMEAR  11/07/2016  . TETANUS/TDAP  07/18/2023  . HIV Screening  Completed    Lab Results  Component Value Date   WBC 10.5 01/20/2015   HGB 14.2 01/20/2015   HCT 43.4 01/20/2015   PLT 358 01/20/2015   GLUCOSE 100 (H) 01/20/2015   CHOL 137 12/27/2014   TRIG 67.0 12/27/2014   HDL 43.80 12/27/2014   LDLCALC 79 12/27/2014   ALT 17 12/27/2014   AST 15 12/27/2014   NA 139 01/20/2015   K 3.4 (L) 01/20/2015   CL 107 01/20/2015   CREATININE 1.02 (H) 01/20/2015   BUN 13 01/20/2015   CO2 25 01/20/2015   TSH 1.41 12/27/2014    Lab Results  Component Value Date   TSH 1.41 12/27/2014   Lab Results  Component Value Date   WBC 10.5 01/20/2015   HGB 14.2 01/20/2015   HCT 43.4 01/20/2015   MCV 88.9 01/20/2015   PLT 358 01/20/2015   Lab Results  Component Value Date   NA 139 01/20/2015   K 3.4 (L) 01/20/2015   CO2 25 01/20/2015   GLUCOSE 100 (H) 01/20/2015   BUN 13 01/20/2015   CREATININE 1.02 (H) 01/20/2015   BILITOT 0.4 12/27/2014   ALKPHOS 69 12/27/2014   AST 15 12/27/2014   ALT 17 12/27/2014   PROT 7.1 12/27/2014   ALBUMIN 3.9 12/27/2014    CALCIUM 9.2 01/20/2015   ANIONGAP 7 01/20/2015   GFR 72.14 12/27/2014   Lab Results  Component Value Date   CHOL 137 12/27/2014   Lab Results  Component Value Date   HDL 43.80 12/27/2014  Lab Results  Component Value Date   LDLCALC 79 12/27/2014   Lab Results  Component Value Date   TRIG 67.0 12/27/2014   Lab Results  Component Value Date   CHOLHDL 3 12/27/2014   No results found for: HGBA1C       Assessment & Plan:   Problem List Items Addressed This Visit    None      I am having Lauralee Evener maintain her cholecalciferol, ibuprofen, buPROPion, amphetamine-dextroamphetamine, and doxycycline.  No orders of the defined types were placed in this encounter.   {PROVIDER TO DELETE} Jerene Dilling, CMA

## 2017-04-25 NOTE — Assessment & Plan Note (Signed)
Add zoloft to wellbutrin F/u 1 month or sooner prn Pt given number for counselor here at the office

## 2017-05-17 ENCOUNTER — Encounter: Payer: Self-pay | Admitting: Family Medicine

## 2017-05-17 NOTE — Telephone Encounter (Signed)
If it was more recent maybe but it was Feb at [facility] and strep was neg  She would need ov

## 2017-07-21 ENCOUNTER — Other Ambulatory Visit: Payer: Self-pay | Admitting: Family Medicine

## 2017-07-21 DIAGNOSIS — F988 Other specified behavioral and emotional disorders with onset usually occurring in childhood and adolescence: Secondary | ICD-10-CM

## 2017-07-22 MED ORDER — AMPHETAMINE-DEXTROAMPHET ER 30 MG PO CP24: 30.0000 mg | ORAL_CAPSULE | ORAL | 0 refills | Status: DC

## 2017-07-22 NOTE — Telephone Encounter (Addendum)
Requesting:Adderall Contract:04/25/17 UDS:01/28/17 Last Visit:04/25/17 Next Visit:none Last Refill:04/25/17  Database ran and is on desk for approval  Please Advise

## 2017-08-08 ENCOUNTER — Ambulatory Visit (INDEPENDENT_AMBULATORY_CARE_PROVIDER_SITE_OTHER): Payer: No Typology Code available for payment source | Admitting: Family Medicine

## 2017-08-08 ENCOUNTER — Ambulatory Visit (HOSPITAL_BASED_OUTPATIENT_CLINIC_OR_DEPARTMENT_OTHER)
Admission: RE | Admit: 2017-08-08 | Discharge: 2017-08-08 | Disposition: A | Discharge status: Home / Self Care | Payer: No Typology Code available for payment source | Source: Ambulatory Visit | Attending: Family Medicine | Admitting: Family Medicine

## 2017-08-08 ENCOUNTER — Encounter: Payer: Self-pay | Admitting: Family Medicine

## 2017-08-08 VITALS — BP 136/88 | HR 86 | Temp 98.2°F | Resp 16 | Ht 67.0 in | Wt 278.2 lb

## 2017-08-08 DIAGNOSIS — M25561 Pain in right knee: Secondary | ICD-10-CM

## 2017-08-08 DIAGNOSIS — M25461 Effusion, right knee: Secondary | ICD-10-CM | POA: Insufficient documentation

## 2017-08-08 MED ORDER — MELOXICAM 15 MG PO TABS: ORAL_TABLET | ORAL | 2 refills | Status: DC

## 2017-08-08 NOTE — Patient Instructions (Signed)

## 2017-08-08 NOTE — Progress Notes (Signed)
Patient ID: Marisa Humphrey, female   DOB: May 23, 1980, 37 y.o.   MRN: [id number]    Subjective:  I acted as a Education administrator for Dr. Carollee Herter.  [name], [place]   Patient ID: Marisa Humphrey, female    DOB: January 15, 1981, 37 y.o.   MRN: [id number]  Chief Complaint  Patient presents with  . Edema    right knee    HPI  Patient is in today for right knee pain and swelling.  Has been hurting for the past month.  Just over the past week started swelling and is in more pain.  No known injury.   Knees cracking and popping .  Knee feels weak.      Patient Care Team: Carollee Herter, Alferd Apa, DO as PCP - General Aloha Gell, MD as Consulting Physician (Obstetrics and Gynecology)   Past Medical History:  Diagnosis Date  . Anxiety   . Depression   . External hemorrhoids without mention of complication   . GERD (gastroesophageal reflux disease)   . History of condyloma acuminatum   . Hx of varicella   . Postpartum care following vaginal delivery (7/26) 09/23/2013    Past Surgical History:  Procedure Laterality Date  . LAPAROSCOPIC GASTRIC BANDING  11/09/2005   Dr.Martin  . MOLE REMOVAL      Family History  Problem Relation Age of Onset  . Heart attack Father 4  . Heart disease Father 81       MI  . Diabetes Mother   . Hypertension Mother   . Hyperlipidemia Mother   . Cancer Other        breast  . Diabetes Maternal Grandmother   . Multiple sclerosis Brother     Social History   Socioeconomic History  . Marital status: Married    Spouse name: Not on file  . Number of children: 1  . Years of education: Not on file  . Highest education level: Not on file  Occupational History  . Occupation: Optician, dispensing: [employer]    Employer: [employer]  Social Needs  . Financial resource strain: Not on file  . Food insecurity:    Worry: Not on file    Inability: Not on file  . Transportation needs:    Medical: Not on file    Non-medical:  Not on file  Tobacco Use  . Smoking status: Former Smoker    Packs/day: 0.50    Years: 16.00    Pack years: 8.00    Types: Cigarettes  . Smokeless tobacco: Never Used  Substance and Sexual Activity  . Alcohol use: No  . Drug use: No  . Sexual activity: Yes    Partners: Male  Lifestyle  . Physical activity:    Days per week: Not on file    Minutes per session: Not on file  . Stress: Not on file  Relationships  . Social connections:    Talks on phone: Not on file    Gets together: Not on file    Attends religious service: Not on file    Active member of club or organization: Not on file    Attends meetings of clubs or organizations: Not on file    Relationship status: Not on file  . Intimate partner violence:    Fear of current or ex partner: Not on file    Emotionally abused: Not on file    Physically abused: Not on file    Forced sexual  activity: Not on file  Other Topics Concern  . Not on file  Social History Narrative   Exercising --no    Outpatient Medications Prior to Visit  Medication Sig Dispense Refill  . amphetamine-dextroamphetamine (ADDERALL XR) 30 MG 24 hr capsule Take 1 capsule (30 mg total) by mouth every morning. 90 capsule 0  . ibuprofen (ADVIL,MOTRIN) 600 MG tablet Take 1 tablet (600 mg total) by mouth every 6 (six) hours. (Patient taking differently: Take 600 mg by mouth every 6 (six) hours as needed. ) 30 tablet 0  . buPROPion (WELLBUTRIN XL) 300 MG 24 hr tablet Take 1 tablet (300 mg total) by mouth daily. 90 tablet 0  . cholecalciferol (VITAMIN D) 1000 UNITS tablet Take 2,000 Units by mouth daily.    Marland Kitchen doxycycline (VIBRAMYCIN) 100 MG capsule Take 1 capsule (100 mg total) by mouth 2 (two) times daily. 20 capsule 0  . sertraline (ZOLOFT) 50 MG tablet Take 1 tablet (50 mg total) by mouth daily. 30 tablet 3   No facility-administered medications prior to visit.     Allergies  Allergen Reactions  . Penicillins Rash    Amoxil has been taken w/o  reaction    Review of Systems  Constitutional: Negative for fever and malaise/fatigue.  HENT: Negative for congestion.   Eyes: Negative for blurred vision.  Respiratory: Negative for cough and shortness of breath.   Cardiovascular: Negative for chest pain, palpitations and leg swelling.  Gastrointestinal: Negative for vomiting.  Musculoskeletal: Positive for joint pain (right knee). Negative for back pain.  Skin: Negative for rash.  Neurological: Negative for loss of consciousness and headaches.       Objective:    Physical Exam  Constitutional: She is oriented to person, place, and time. She appears well-developed and well-nourished.  HENT:  Head: Normocephalic and atraumatic.  Eyes: Conjunctivae and EOM are normal.  Neck: Normal range of motion. Neck supple. No JVD present. Carotid bruit is not present. No thyromegaly present.  Cardiovascular: Normal rate, regular rhythm and normal heart sounds.  No murmur heard. Pulmonary/Chest: Effort normal and breath sounds normal. No respiratory distress. She has no wheezes. She has no rales. She exhibits no tenderness.  Musculoskeletal: She exhibits no edema.       Right knee: She exhibits decreased range of motion, swelling and effusion. She exhibits no ecchymosis and no erythema. Tenderness found. Patellar tendon tenderness noted.  Neurological: She is alert and oriented to person, place, and time.  Psychiatric: She has a normal mood and affect.  Nursing note and vitals reviewed.   BP 136/88 (BP Location: Left Arm, Cuff Size: Large)   Pulse 86   Temp 98.2 F (36.8 C) (Oral)   Resp 16   Ht 5\' 7"  (1.702 m)   Wt 278 lb 3.2 oz (126.2 kg)   LMP 07/25/2017   SpO2 96%   BMI 43.57 kg/m  Wt Readings from Last 3 Encounters:  08/08/17 278 lb 3.2 oz (126.2 kg)  04/25/17 279 lb 3.2 oz (126.6 kg)  10/28/16 279 lb 12.8 oz (126.9 kg)   BP Readings from Last 3 Encounters:  08/08/17 136/88  04/25/17 130/86  04/21/17 124/82      Immunization History  Administered Date(s) Administered  . Influenza Whole 12/21/2006  . Influenza-Unspecified 12/06/2014  . Td 09/12/2002  . Tdap 07/17/2013    Health Maintenance  Topic Date Due  . PAP SMEAR  11/07/2016  . INFLUENZA VACCINE  09/29/2017  . TETANUS/TDAP  07/18/2023  . HIV Screening  Completed    Lab Results  Component Value Date   WBC 10.5 01/20/2015   HGB 14.2 01/20/2015   HCT 43.4 01/20/2015   PLT 358 01/20/2015   GLUCOSE 100 (H) 01/20/2015   CHOL 137 12/27/2014   TRIG 67.0 12/27/2014   HDL 43.80 12/27/2014   LDLCALC 79 12/27/2014   ALT 17 12/27/2014   AST 15 12/27/2014   NA 139 01/20/2015   K 3.4 (L) 01/20/2015   CL 107 01/20/2015   CREATININE 1.02 (H) 01/20/2015   BUN 13 01/20/2015   CO2 25 01/20/2015   TSH 1.41 12/27/2014    Lab Results  Component Value Date   TSH 1.41 12/27/2014   Lab Results  Component Value Date   WBC 10.5 01/20/2015   HGB 14.2 01/20/2015   HCT 43.4 01/20/2015   MCV 88.9 01/20/2015   PLT 358 01/20/2015   Lab Results  Component Value Date   NA 139 01/20/2015   K 3.4 (L) 01/20/2015   CO2 25 01/20/2015   GLUCOSE 100 (H) 01/20/2015   BUN 13 01/20/2015   CREATININE 1.02 (H) 01/20/2015   BILITOT 0.4 12/27/2014   ALKPHOS 69 12/27/2014   AST 15 12/27/2014   ALT 17 12/27/2014   PROT 7.1 12/27/2014   ALBUMIN 3.9 12/27/2014   CALCIUM 9.2 01/20/2015   ANIONGAP 7 01/20/2015   GFR 72.14 12/27/2014   Lab Results  Component Value Date   CHOL 137 12/27/2014   Lab Results  Component Value Date   HDL 43.80 12/27/2014   Lab Results  Component Value Date   LDLCALC 79 12/27/2014   Lab Results  Component Value Date   TRIG 67.0 12/27/2014   Lab Results  Component Value Date   CHOLHDL 3 12/27/2014   No results found for: HGBA1C       Assessment & Plan:   Problem List Items Addressed This Visit    None    Visit Diagnoses    Recurrent pain of right knee    -  Primary   Relevant Medications    meloxicam (MOBIC) 15 MG tablet   Other Relevant Orders   DG Knee Complete 4 Views Right   Ambulatory referral to Sports Medicine     knee sleeve given to pt Ice , rest , elevate   I have discontinued Claudius Sis Solomon's cholecalciferol, buPROPion, doxycycline, and sertraline. I am also having her start on meloxicam. Additionally, I am having her maintain her ibuprofen and amphetamine-dextroamphetamine.  Meds ordered this encounter  Medications  . meloxicam (MOBIC) 15 MG tablet    Sig: 1/2-1 po qd prn    Dispense:  30 tablet    Refill:  2    CMA served as scribe during this visit. History, Physical and Plan performed by medical provider. Documentation and orders reviewed and attested to.  Ann Held, DO

## 2017-08-09 ENCOUNTER — Telehealth: Payer: Self-pay | Admitting: Family Medicine

## 2017-08-09 NOTE — Telephone Encounter (Signed)
Left message with results on machine

## 2017-08-09 NOTE — Telephone Encounter (Signed)
Copied from [place] [id number]. Topic: Quick Communication - See Telephone Encounter >> Aug 09, 2017  8:24 AM Boyd Kerbs wrote: CRM for notification. See Telephone encounter for: 08/09/17.   Pt calling to see what results of xray from yesterday.  Please call pt

## 2017-08-22 ENCOUNTER — Ambulatory Visit: Payer: No Typology Code available for payment source | Admitting: Family Medicine

## 2017-08-22 ENCOUNTER — Encounter: Payer: Self-pay | Admitting: Family Medicine

## 2017-08-22 DIAGNOSIS — M25561 Pain in right knee: Secondary | ICD-10-CM | POA: Insufficient documentation

## 2017-08-22 NOTE — Patient Instructions (Signed)
Your pain is consistent with likely patellar tendinitis with your mild arthritis. These are the different medications you can take for this: Tylenol 500mg  1-2 tabs three times a day for pain. Capsaicin, aspercreme, or biofreeze topically up to four times a day may also help with pain. Some supplements that may help for arthritis: Boswellia extract, curcumin, pycnogenol Meloxicam 15mg  daily with food only as needed. Cortisone injections are an option if pain is very severe. It's important that you continue to stay active. Straight leg raises, knee extensions 3 sets of 10 once a day (add ankle weight if these become too easy). Consider physical therapy to strengthen muscles around the joint that hurts to take pressure off of the joint itself. Shoe inserts with good arch support may be helpful. Ice 15 minutes at a time 3-4 times a day as needed to help with pain and swelling. Water aerobics and cycling with low resistance are the best two types of exercise for arthritis though any exercise is ok as long as it doesn't worsen the pain. Follow up with me as needed.

## 2017-08-22 NOTE — Assessment & Plan Note (Signed)
Patient is improving and not exhibiting any symptoms today. Based on her history, she was likely experiencing patellar tendonitis in addition to an arthritis flare. Right knee radiographs performed on 08/08/17 were reviewed and show minimal degenerative changes with an effusion. She will continue to use meloxicam and ice as needed and we recommended at home exercises as well. All questions and concerns were answered to her satisfaction. She will follow-up as needed for recurrent symptoms.

## 2017-08-22 NOTE — Progress Notes (Signed)
Subjective:   PCP and consultation requested by Dr. Carollee Herter   Patient ID: Marisa Humphrey is a 37 y.o. female.  Chief Complaint: Right knee pain/swelling  HPI:  Patient presents with a history of right knee pain for the past 2 months. She denies any recent or prior injuries to the knee. She says the pain was about a 5/10 severity around the patella tendon with walking and about 4 weeks ago her knee became swollen as well. She can't recall doing anything different around the time of swelling onset and says her calf and thigh were also slightly swollen. At this time, she saw her PCP who prescribed her meloxicam 15 mg PRN and gave her a knee sleeve as well as recommended icing.  Since, she has been doing much better, is in 0/10 pain today, and says the swelling has decreased. She denies any weakness or locking of the joint but does occasionally feel popping and cracking. Denies the joint was ever red or warm to the touch.    ROS: Constitutional: Negative for fever, chills. Skin: Negative for erythema, ecchymosis. MSK: See HPI above. Neuro: Negative for numbness, tingling.  Past Medical History:  Diagnosis Date  . Anxiety   . Depression   . External hemorrhoids without mention of complication   . GERD (gastroesophageal reflux disease)   . History of condyloma acuminatum   . Hx of varicella   . Postpartum care following vaginal delivery (7/26) 09/23/2013    Current Outpatient Medications on File Prior to Visit  Medication Sig Dispense Refill  . amphetamine-dextroamphetamine (ADDERALL XR) 30 MG 24 hr capsule Take 1 capsule (30 mg total) by mouth every morning. 90 capsule 0  . ibuprofen (ADVIL,MOTRIN) 600 MG tablet Take 1 tablet (600 mg total) by mouth every 6 (six) hours. (Patient taking differently: Take 600 mg by mouth every 6 (six) hours as needed. ) 30 tablet 0  . meloxicam (MOBIC) 15 MG tablet 1/2-1 po qd prn 30 tablet 2   No current facility-administered medications  on file prior to visit.     Past Surgical History:  Procedure Laterality Date  . LAPAROSCOPIC GASTRIC BANDING  11/09/2005   Dr.Martin  . MOLE REMOVAL      Allergies  Allergen Reactions  . Penicillins Rash    Amoxil has been taken w/o reaction    Social History   Socioeconomic History  . Marital status: Married    Spouse name: Not on file  . Number of children: 1  . Years of education: Not on file  . Highest education level: Not on file  Occupational History  . 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 Financial resource strain: Not on file  . Food insecurity:    Worry: Not on file    Inability: Not on file  . Transportation needs:    Medical: Not on file    Non-medical: Not on file  Tobacco Use  . Smoking status: Former Smoker    Packs/day: 0.50    Years: 16.00    Pack years: 8.00    Types: Cigarettes  . Smokeless tobacco: Never Used  Substance and Sexual Activity  . Alcohol use: No  . Drug use: No  . Sexual activity: Yes    Partners: Male  Lifestyle  . Physical activity:    Days per week: Not on file    Minutes per session: Not on file  .  Stress: Not on file  Relationships  . Social connections:    Talks on phone: Not on file    Gets together: Not on file    Attends religious service: Not on file    Active member of club or organization: Not on file    Attends meetings of clubs or organizations: Not on file    Relationship status: Not on file  . Intimate partner violence:    Fear of current or ex partner: Not on file    Emotionally abused: Not on file    Physically abused: Not on file    Forced sexual activity: Not on file  Other Topics Concern  . Not on file  Social History Narrative   Exercising --no    Family History  Problem Relation Age of Onset  . Heart attack Father 27  . Heart disease Father 37       MI  . Diabetes Mother   . Hypertension Mother   . Hyperlipidemia Mother    . Cancer Other        breast  . Diabetes Maternal Grandmother   . Multiple sclerosis Brother     BP 126/83   Pulse 77   Ht 5\' 7"  (1.702 m)   Wt 287 lb (130.2 kg)   LMP 07/25/2017   BMI 44.95 kg/m       Objective:  Physical Exam: Gen: NAD, comfortable in exam room  Right knee: No gross deformity, ecchymoses. Minimal effusion. No TTP. FROM with 5/5 strength. Negative ant/post drawers. Negative valgus/varus testing. Negative lachmanns. Negative mcmurrays, apleys, patellar apprehension, Thessaly. NV intact distally.  Left knee: No gross deformity, ecchymoses, swelling. No TTP. FROM with 5/5 strength. Negative ant/post drawers. Negative valgus/varus testing. Negative mcmurrays, apleys. NV intact distally.  MSK u/s: Right knee: minimal superolateral effusion. Very small Baker's cyst in popliteal fossa. No osseous abnormalities visualized.    Assessment:  Right knee pain (M25.561)  Plan:  Patient is improving and not exhibiting any symptoms today. Based on her history, she was likely experiencing patellar tendonitis in addition to an arthritis flare. Right knee radiographs performed on 08/08/17 were reviewed and show minimal degenerative changes with an effusion. She will continue to use meloxicam and ice as needed and we recommended at home exercises as well. All questions and concerns were answered to her satisfaction. She will follow-up as needed for recurrent symptoms.

## 2017-08-24 ENCOUNTER — Ambulatory Visit (INDEPENDENT_AMBULATORY_CARE_PROVIDER_SITE_OTHER): Payer: No Typology Code available for payment source | Admitting: Internal Medicine

## 2017-08-24 ENCOUNTER — Encounter: Payer: Self-pay | Admitting: Internal Medicine

## 2017-08-24 VITALS — BP 124/78 | HR 69 | Temp 97.9°F | Resp 16 | Ht 67.0 in | Wt 276.1 lb

## 2017-08-24 DIAGNOSIS — R591 Generalized enlarged lymph nodes: Secondary | ICD-10-CM | POA: Diagnosis not present

## 2017-08-24 MED ORDER — CLINDAMYCIN HCL 300 MG PO CAPS: 300.0000 mg | ORAL_CAPSULE | Freq: Three times a day (TID) | ORAL | 0 refills | Status: DC

## 2017-08-24 NOTE — Progress Notes (Signed)
Subjective:    Patient ID: Marisa Humphrey, female    DOB: Oct 25, 1980, 37 y.o.   MRN: [id number]  DOS:  08/24/2017 Type of visit - description : Acute visit Interval history: Her chief complaint is a lymph node at the right side of the neck. She has noted that gland to be slightly larger compared to the left for the last few months after she had a URI/throat infex Yesterday it definitely gotten bigger and tender, today the  size has decreased to some extent. 2 days ago she had a dental cleaning, otherwise no dental pain or procedures.   Review of Systems  Denies fever chills No sore throat No other lumps noted anywhere. Not on BCPs, LMP 1 week ago. Past Medical History:  Diagnosis Date  . Anxiety   . Depression   . External hemorrhoids without mention of complication   . GERD (gastroesophageal reflux disease)   . History of condyloma acuminatum   . Hx of varicella   . Postpartum care following vaginal delivery (7/26) 09/23/2013    Past Surgical History:  Procedure Laterality Date  . LAPAROSCOPIC GASTRIC BANDING  11/09/2005   Dr.Martin  . MOLE REMOVAL      Social History   Socioeconomic History  . Marital status: Married    Spouse name: Not on file  . Number of children: 1  . Years of education: Not on file  . Highest education level: Not on file  Occupational History  . Occupation: Optician, dispensing: [employer]    Employer: [employer]  Social Needs  . Financial resource strain: Not on file  . Food insecurity:    Worry: Not on file    Inability: Not on file  . Transportation needs:    Medical: Not on file    Non-medical: Not on file  Tobacco Use  . Smoking status: Former Smoker    Packs/day: 0.50    Years: 16.00    Pack years: 8.00    Types: Cigarettes  . Smokeless tobacco: Never Used  Substance and Sexual Activity  . Alcohol use: No  . Drug use: No  . Sexual activity: Yes    Partners: Male  Lifestyle  . Physical  activity:    Days per week: Not on file    Minutes per session: Not on file  . Stress: Not on file  Relationships  . Social connections:    Talks on phone: Not on file    Gets together: Not on file    Attends religious service: Not on file    Active member of club or organization: Not on file    Attends meetings of clubs or organizations: Not on file    Relationship status: Not on file  . Intimate partner violence:    Fear of current or ex partner: Not on file    Emotionally abused: Not on file    Physically abused: Not on file    Forced sexual activity: Not on file  Other Topics Concern  . Not on file  Social History Narrative   Exercising --no      Allergies as of 08/24/2017      Reactions   Penicillins Rash   Amoxil has been taken w/o reaction      Medication List        Accurate as of 08/24/17 11:59 PM. Always use your most recent med list.          amphetamine-dextroamphetamine 30 MG  24 hr capsule Commonly known as:  ADDERALL XR Take 1 capsule (30 mg total) by mouth every morning.   clindamycin 300 MG capsule Commonly known as:  CLEOCIN Take 1 capsule (300 mg total) by mouth 3 (three) times daily.   ibuprofen 600 MG tablet Commonly known as:  ADVIL,MOTRIN Take 1 tablet (600 mg total) by mouth every 6 (six) hours.   meloxicam 15 MG tablet Commonly known as:  MOBIC 1/2-1 po qd prn          Objective:   Physical Exam BP 124/78 (BP Location: Left Arm, Patient Position: Sitting, Cuff Size: Normal)   Pulse 69   Temp 97.9 F (36.6 C) (Oral)   Resp 16   Ht 5\' 7"  (1.702 m)   Wt 276 lb 2 oz (125.2 kg)   LMP 07/25/2017   SpO2 98%   Breastfeeding? No   BMI 43.25 kg/m  General:   Well developed, NAD, see BMI.  HEENT:  Normocephalic . Face symmetric, atraumatic. Throat symmetric, palpation of the gums and floor of the mouth with no abscess or induration.  Membranes with no lesions. Neck: Symmetric to inspection, upon palpation she has a Right 1.5 cm,  mobile, soft lymph node.  On the left side, there is a symmetric but just smaller lymph node.  Otherwise neck is free of any masses. Skin: Not pale. Not jaundice Neurologic:  alert & oriented X3.  Speech normal, gait appropriate for age and unassisted Psych--  Cognition and judgment appear intact.  Cooperative with normal attention span and concentration.  Behavior appropriate. No anxious or depressed appearing.      Assessment & Plan:   37 year old female, PMH includes morbid obesity, s/p gastric banding, depression, ADD presents with  Right neck lymphadenopathy: Benign features clinically, in the context of recent dental cleaning, related?. She is allergic to penicillin, will treat with clindamycin for 1 week. Advised patient that if the area is not back to normal in few days she needs to come back for re-eval.  Imaging?Marland Kitchen

## 2017-08-24 NOTE — Progress Notes (Signed)
Pre visit review using our clinic review tool, if applicable. No additional management support is needed unless otherwise documented below in the visit note. 

## 2017-08-24 NOTE — Patient Instructions (Signed)
Please take the antibiotics as prescribed for 1 week  Call if not gradually better, you will need to be seen again

## 2017-10-10 ENCOUNTER — Other Ambulatory Visit: Payer: Self-pay | Admitting: Family Medicine

## 2017-10-12 ENCOUNTER — Encounter: Payer: Self-pay | Admitting: Family Medicine

## 2017-10-18 ENCOUNTER — Ambulatory Visit (INDEPENDENT_AMBULATORY_CARE_PROVIDER_SITE_OTHER): Payer: No Typology Code available for payment source | Admitting: Family Medicine

## 2017-10-18 ENCOUNTER — Encounter: Payer: Self-pay | Admitting: Family Medicine

## 2017-10-18 DIAGNOSIS — F988 Other specified behavioral and emotional disorders with onset usually occurring in childhood and adolescence: Secondary | ICD-10-CM

## 2017-10-18 DIAGNOSIS — Z79899 Other long term (current) drug therapy: Secondary | ICD-10-CM | POA: Diagnosis not present

## 2017-10-18 MED ORDER — BUPROPION HCL ER (XL) 300 MG PO TB24: 300.0000 mg | ORAL_TABLET | Freq: Every day | ORAL | 3 refills | Status: DC

## 2017-10-18 MED ORDER — AMPHETAMINE-DEXTROAMPHET ER 30 MG PO CP24: 30.0000 mg | ORAL_CAPSULE | ORAL | 0 refills | Status: DC

## 2017-10-18 NOTE — Addendum Note (Signed)
Addended by: Kem Boroughs D on: 10/18/2017 04:37 PM   Modules accepted: Orders

## 2017-10-18 NOTE — Patient Instructions (Signed)

## 2017-10-18 NOTE — Assessment & Plan Note (Signed)
con't diet and exercise Refer to healthy weight and wellness

## 2017-10-18 NOTE — Progress Notes (Signed)
Patient ID: Marisa Humphrey, female   DOB: 06/10/1980, 37 y.o.   MRN: [id number]     Subjective:  I acted as a Education administrator for Dr. Carollee Humphrey.  Marisa Humphrey, [place]   Patient ID: Marisa Humphrey, female    DOB: Apr 02, 1980, 37 y.o.   MRN: [id number]  Chief Complaint  Patient presents with  . ADD  . Lymphadenopathy    HPI  Patient is in today for follow up ADD and lymphadenopathy.  She is having no trouble with the meds.  Doing well.   She would also like [place] to check her adenopathy and is wanting a referral to healthy weight and wellness.  No other complaints.   Patient Care Team: Marisa Humphrey, Marisa Apa, DO as PCP - General Marisa Gell, MD as Consulting Physician (Obstetrics and Gynecology)   Past Medical History:  Diagnosis Date  . Anxiety   . Depression   . External hemorrhoids without mention of complication   . GERD (gastroesophageal reflux disease)   . History of condyloma acuminatum   . Hx of varicella   . Postpartum care following vaginal delivery (7/26) 09/23/2013    Past Surgical History:  Procedure Laterality Date  . LAPAROSCOPIC GASTRIC BANDING  11/09/2005   Dr.Martin  . MOLE REMOVAL      Family History  Problem Relation Age of Onset  . Heart attack Father 65  . Heart disease Father 54       MI  . Diabetes Mother   . Hypertension Mother   . Hyperlipidemia Mother   . Cancer Other        breast  . Diabetes Maternal Grandmother   . Multiple sclerosis Brother     Social History   Socioeconomic History  . Marital status: Married    Spouse name: Not on file  . Number of children: 1  . Years of education: Not on file  . Highest education level: Not on file  Occupational History  . Occupation: Optician, dispensing: [employer]    Employer: [employer]  Social Needs  . Financial resource strain: Not on file  . Food insecurity:    Worry: Not on file    Inability: Not on file  . Transportation needs:    Medical: Not on file     Non-medical: Not on file  Tobacco Use  . Smoking status: Former Smoker    Packs/day: 0.50    Years: 16.00    Pack years: 8.00    Types: Cigarettes  . Smokeless tobacco: Never Used  Substance and Sexual Activity  . Alcohol use: No  . Drug use: No  . Sexual activity: Yes    Partners: Male  Lifestyle  . Physical activity:    Days per week: Not on file    Minutes per session: Not on file  . Stress: Not on file  Relationships  . Social connections:    Talks on phone: Not on file    Gets together: Not on file    Attends religious service: Not on file    Active member of club or organization: Not on file    Attends meetings of clubs or organizations: Not on file    Relationship status: Not on file  . Intimate partner violence:    Fear of current or ex partner: Not on file    Emotionally abused: Not on file    Physically abused: Not on file    Forced sexual activity: Not on  file  Other Topics Concern  . Not on file  Social History Narrative   Exercising --no    Outpatient Medications Prior to Visit  Medication Sig Dispense Refill  . amphetamine-dextroamphetamine (ADDERALL XR) 30 MG 24 hr capsule Take 1 capsule (30 mg total) by mouth every morning. 90 capsule 0  . buPROPion (WELLBUTRIN XL) 300 MG 24 hr tablet TAKE 1 TABLET BY MOUTH ONCE DAILY 90 tablet 0  . clindamycin (CLEOCIN) 300 MG capsule Take 1 capsule (300 mg total) by mouth 3 (three) times daily. 21 capsule 0  . ibuprofen (ADVIL,MOTRIN) 600 MG tablet Take 1 tablet (600 mg total) by mouth every 6 (six) hours. (Patient not taking: Reported on 08/24/2017) 30 tablet 0  . meloxicam (MOBIC) 15 MG tablet 1/2-1 po qd prn (Patient not taking: Reported on 08/24/2017) 30 tablet 2   No facility-administered medications prior to visit.     Allergies  Allergen Reactions  . Penicillins Rash    Amoxil has been taken w/o reaction    Review of Systems  Constitutional: Negative for fever and malaise/fatigue.  HENT: Negative for  congestion.   Eyes: Negative for blurred vision.  Respiratory: Negative for cough and shortness of breath.   Cardiovascular: Negative for chest pain, palpitations and leg swelling.  Gastrointestinal: Negative for vomiting.  Musculoskeletal: Negative for back pain.  Skin: Negative for rash.  Neurological: Negative for loss of consciousness and headaches.       Objective:    Physical Exam  Constitutional: She is oriented to person, place, and time. She appears well-developed and well-nourished.  HENT:  Head: Normocephalic and atraumatic.  Eyes: Conjunctivae and EOM are normal.  Neck: Normal range of motion. Neck supple. No JVD present. Carotid bruit is not present. No thyromegaly present.  Cardiovascular: Normal rate, regular rhythm and normal heart sounds.  No murmur heard. Pulmonary/Chest: Effort normal and breath sounds normal. No respiratory distress. She has no wheezes. She has no rales. She exhibits no tenderness.  Musculoskeletal: She exhibits no edema.  Neurological: She is alert and oriented to person, place, and time.  Psychiatric: She has a normal mood and affect.  Nursing note and vitals reviewed.   BP 128/79 (BP Location: Right Arm, Cuff Size: Large)   Pulse 81   Temp 98.2 F (36.8 C) (Oral)   Resp 16   Ht 5\' 7"  (1.702 m)   Wt 276 lb 6.4 oz (125.4 kg)   LMP 10/12/2017   SpO2 100%   BMI 43.29 kg/m  Wt Readings from Last 3 Encounters:  10/18/17 276 lb 6.4 oz (125.4 kg)  08/24/17 276 lb 2 oz (125.2 kg)  08/22/17 287 lb (130.2 kg)   BP Readings from Last 3 Encounters:  10/18/17 128/79  08/24/17 124/78  08/22/17 126/83     Immunization History  Administered Date(s) Administered  . Influenza Whole 12/21/2006  . Influenza-Unspecified 12/06/2014  . Td 09/12/2002  . Tdap 07/17/2013    Health Maintenance  Topic Date Due  . PAP SMEAR  11/07/2016  . INFLUENZA VACCINE  09/29/2017  . TETANUS/TDAP  07/18/2023  . HIV Screening  Completed    Lab Results    Component Value Date   WBC 10.5 01/20/2015   HGB 14.2 01/20/2015   HCT 43.4 01/20/2015   PLT 358 01/20/2015   GLUCOSE 100 (H) 01/20/2015   CHOL 137 12/27/2014   TRIG 67.0 12/27/2014   HDL 43.80 12/27/2014   LDLCALC 79 12/27/2014   ALT 17 12/27/2014   AST 15  12/27/2014   NA 139 01/20/2015   K 3.4 (L) 01/20/2015   CL 107 01/20/2015   CREATININE 1.02 (H) 01/20/2015   BUN 13 01/20/2015   CO2 25 01/20/2015   TSH 1.41 12/27/2014    Lab Results  Component Value Date   TSH 1.41 12/27/2014   Lab Results  Component Value Date   WBC 10.5 01/20/2015   HGB 14.2 01/20/2015   HCT 43.4 01/20/2015   MCV 88.9 01/20/2015   PLT 358 01/20/2015   Lab Results  Component Value Date   NA 139 01/20/2015   K 3.4 (L) 01/20/2015   CO2 25 01/20/2015   GLUCOSE 100 (H) 01/20/2015   BUN 13 01/20/2015   CREATININE 1.02 (H) 01/20/2015   BILITOT 0.4 12/27/2014   ALKPHOS 69 12/27/2014   AST 15 12/27/2014   ALT 17 12/27/2014   PROT 7.1 12/27/2014   ALBUMIN 3.9 12/27/2014   CALCIUM 9.2 01/20/2015   ANIONGAP 7 01/20/2015   GFR 72.14 12/27/2014   Lab Results  Component Value Date   CHOL 137 12/27/2014   Lab Results  Component Value Date   HDL 43.80 12/27/2014   Lab Results  Component Value Date   LDLCALC 79 12/27/2014   Lab Results  Component Value Date   TRIG 67.0 12/27/2014   Lab Results  Component Value Date   CHOLHDL 3 12/27/2014   No results found for: HGBA1C       Assessment & Plan:   Problem List Items Addressed This Visit      Unprioritized   Attention deficit 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    Stable Con' t ,med no side effects of meds      Morbid obesity ([place]) - Primary (Chronic)    con't diet and exercise Refer to healthy weight and wellness      Relevant Medications   amphetamine-dextroamphetamine (ADDERALL XR) 30 MG 24 hr capsule   Other Relevant Orders   Amb Ref to Medical Weight Management    Other Visit Diagnoses    Attention deficit  disorder, unspecified hyperactivity presence       Relevant Medications   amphetamine-dextroamphetamine (ADDERALL XR) 30 MG 24 hr capsule      I have discontinued Claudius Sis Solomon's ibuprofen, meloxicam, and clindamycin. I have also changed her buPROPion. Additionally, I am having her maintain her amphetamine-dextroamphetamine.  Meds ordered this encounter  Medications  . amphetamine-dextroamphetamine (ADDERALL XR) 30 MG 24 hr capsule    Sig: Take 1 capsule (30 mg total) by mouth every morning.    Dispense:  90 capsule    Refill:  0  . buPROPion (WELLBUTRIN XL) 300 MG 24 hr tablet    Sig: Take 1 tablet (300 mg total) by mouth daily.    Dispense:  90 tablet    Refill:  3    CMA served as scribe during this visit. History, Physical and Plan performed by medical provider. Documentation and orders reviewed and attested to.  Ann Held, DO

## 2017-10-18 NOTE — Assessment & Plan Note (Signed)
Stable Con' t ,med no side effects of meds

## 2017-10-20 LAB — PAIN MGMT, PROFILE 8 W/CONF, U
6 Acetylmorphine: NEGATIVE ng/mL (ref <10)
AMPHETAMINES: POSITIVE ng/mL — AB (ref <500)
Alcohol Metabolites: NEGATIVE ng/mL (ref <500)
Amphetamine: 27365 ng/mL — ABNORMAL HIGH (ref <250)
BUPRENORPHINE, URINE: NEGATIVE ng/mL (ref <5)
Benzodiazepines: NEGATIVE ng/mL (ref <100)
COCAINE METABOLITE: NEGATIVE ng/mL (ref <150)
Creatinine: 226.1 mg/dL
MARIJUANA METABOLITE: NEGATIVE ng/mL (ref <20)
MDMA: NEGATIVE ng/mL (ref <500)
METHAMPHETAMINE: NEGATIVE ng/mL (ref <250)
OPIATES: NEGATIVE ng/mL (ref <100)
Oxidant: NEGATIVE ug/mL (ref <200)
Oxycodone: NEGATIVE ng/mL (ref <100)
pH: 5.64 (ref 4.5–9.0)

## 2018-01-12 ENCOUNTER — Telehealth: Payer: Self-pay | Admitting: Family Medicine

## 2018-01-12 DIAGNOSIS — F988 Other specified behavioral and emotional disorders with onset usually occurring in childhood and adolescence: Secondary | ICD-10-CM

## 2018-01-13 MED ORDER — AMPHETAMINE-DEXTROAMPHET ER 30 MG PO CP24: 30.0000 mg | ORAL_CAPSULE | ORAL | 0 refills | Status: DC

## 2018-01-13 MED FILL — ADDERALL XR 30 MG CAP SA: 30 | 90 days supply | Qty: 90 | Fill #0

## 2018-01-13 NOTE — Telephone Encounter (Signed)
Refill request for Adderall.   Last OV: 08/24/2017 Last Fill: 10/18/2017 #90 and 0RF UDS: 04/25/2017 Low risk

## 2018-01-16 ENCOUNTER — Other Ambulatory Visit: Payer: Self-pay | Admitting: Family Medicine

## 2018-01-16 DIAGNOSIS — F988 Other specified behavioral and emotional disorders with onset usually occurring in childhood and adolescence: Secondary | ICD-10-CM

## 2018-01-16 MED ORDER — AMPHETAMINE-DEXTROAMPHET ER 30 MG PO CP24: 30.0000 mg | ORAL_CAPSULE | ORAL | 0 refills | Status: DC

## 2018-01-16 MED FILL — BuPROPion HCL ER (XL) 300 M: 300 | 90 days supply | Qty: 90 | Fill #0

## 2018-01-16 NOTE — Telephone Encounter (Signed)
Pt called pharmacy and I also confirmed that medication was not sent in. Please advise Cb#[id number]

## 2018-01-16 NOTE — Telephone Encounter (Signed)
done

## 2018-01-16 NOTE — Telephone Encounter (Signed)
Can you send to [facility].  It looks like we sent to [facility]

## 2018-03-12 ENCOUNTER — Ambulatory Visit (INDEPENDENT_AMBULATORY_CARE_PROVIDER_SITE_OTHER): Payer: Self-pay | Admitting: Physician Assistant

## 2018-03-12 VITALS — BP 116/80 | HR 101 | Temp 99.0°F | Resp 20 | Wt 273.6 lb

## 2018-03-12 DIAGNOSIS — J101 Influenza due to other identified influenza virus with other respiratory manifestations: Secondary | ICD-10-CM

## 2018-03-12 DIAGNOSIS — R52 Pain, unspecified: Secondary | ICD-10-CM

## 2018-03-12 LAB — POCT INFLUENZA A/B
Influenza A, POC: NEGATIVE
Influenza B, POC: NEGATIVE

## 2018-03-12 LAB — POC INFLUENZA A&B (BINAX/QUICKVUE)
INFLUENZA B, POC: NEGATIVE
Influenza A, POC: POSITIVE — AB

## 2018-03-12 MED ORDER — PROMETHAZINE-DM 6.25-15 MG/5ML PO SYRP: 5.0000 mL | ORAL_SOLUTION | Freq: Four times a day (QID) | ORAL | 0 refills | Status: DC | PRN

## 2018-03-12 MED ORDER — OSELTAMIVIR PHOSPHATE 75 MG PO CAPS: 75.0000 mg | ORAL_CAPSULE | Freq: Two times a day (BID) | ORAL | 0 refills | Status: DC

## 2018-03-12 NOTE — Patient Instructions (Signed)
Influenza, Adult  You have tested positive for the flu, you are contagious until you are fever free for48 hours without using tylenol or ibuprofen.Please stay out of work until you are no longer contagious.  I provided you prescription for Tamiflu.  I have also given you cough syrup to use at nighttime, and this can make you drowsy.  Please continue to rest, eat light meals, hydrate, and use over-the-counter ibuprofen or Tylenol as needed for fever and general discomfort.  Two major complications after the flu are pneumonia and sinus infections. Please be aware of this and if you are not any better in 7-10 days or you develop worsening cough or sinus pressure, seek care at family doctor or the ED. Continue to wash your hands and wear a mask daily especially around other people.   Influenza is also called "the flu." It is an infection in the lungs, nose, and throat (respiratory tract). It is caused by a virus. The flu causes symptoms that are similar to symptoms of a cold. It also causes a high fever and body aches. The flu spreads easily from person to person (is contagious). Getting a flu shot (influenza vaccination) every year is the best way to prevent the flu. What are the causes? This condition is caused by the influenza virus. You can get the virus by:  Breathing in droplets that are in the air from the cough or sneeze of a person who has the virus.  Touching something that has the virus on it (is contaminated) and then touching your mouth, nose, or eyes. What increases the risk? Certain things may make you more likely to get the flu. These include:  Not washing your hands often.  Having close contact with many people during cold and flu season.  Touching your mouth, eyes, or nose without first washing your hands.  Not getting a flu shot every year. You may have a higher risk for the flu, along with serious problems such as a lung infection (pneumonia), if you:  Are older than  65.  Are pregnant.  Have a weakened disease-fighting system (immune system) because of a disease or taking certain medicines.  Have a long-term (chronic) illness, such as: ? Heart, kidney, or lung disease. ? Diabetes. ? Asthma.  Have a liver disorder.  Are very overweight (morbidly obese).  Have anemia. This is a condition that affects your red blood cells. What are the signs or symptoms? Symptoms usually begin suddenly and last 4-14 days. They may include:  Fever and chills.  Headaches, body aches, or muscle aches.  Sore throat.  Cough.  Runny or stuffy (congested) nose.  Chest discomfort.  Not wanting to eat as much as normal (poor appetite).  Weakness or feeling tired (fatigue).  Dizziness.  Feeling sick to your stomach (nauseous) or throwing up (vomiting). How is this treated? If the flu is found early, you can be treated with medicine that can help reduce how bad the illness is and how long it lasts (antiviral medicine). This may be given by mouth (orally) or through an IV tube. Taking care of yourself at home can help your symptoms get better. Your doctor may suggest:  Taking over-the-counter medicines.  Drinking plenty of fluids. The flu often goes away on its own. If you have very bad symptoms or other problems, you may be treated in a hospital. Follow these instructions at home:     Activity  Rest as needed. Get plenty of sleep.  Stay home from  work or school as told by your doctor. ? Do not leave home until you do not have a fever for 24 hours without taking medicine. ? Leave home only to visit your doctor. Eating and drinking  Take an ORS (oral rehydration solution). This is a drink that is sold at pharmacies and stores.  Drink enough fluid to keep your pee (urine) pale yellow.  Drink clear fluids in small amounts as you are able. Clear fluids include: ? Water. ? Ice chips. ? Fruit juice that has water added (diluted fruit  juice). ? Low-calorie sports drinks.  Eat bland, easy-to-digest foods in small amounts as you are able. These foods include: ? Bananas. ? Applesauce. ? Rice. ? Lean meats. ? Toast. ? Crackers.  Do not eat or drink: ? Fluids that have a lot of sugar or caffeine. ? Alcohol. ? Spicy or fatty foods. General instructions  Take over-the-counter and prescription medicines only as told by your doctor.  Use a cool mist humidifier to add moisture to the air in your home. This can make it easier for you to breathe.  Cover your mouth and nose when you cough or sneeze.  Wash your hands with soap and water often, especially after you cough or sneeze. If you cannot use soap and water, use alcohol-based hand sanitizer.  Keep all follow-up visits as told by your doctor. This is important. How is this prevented?   Get a flu shot every year. You may get the flu shot in late summer, fall, or winter. Ask your doctor when you should get your flu shot.  Avoid contact with people who are sick during fall and winter (cold and flu season). Contact a doctor if:  You get new symptoms.  You have: ? Chest pain. ? Watery poop (diarrhea). ? A fever.  Your cough gets worse.  You start to have more mucus.  You feel sick to your stomach.  You throw up. Get help right away if you:  Have shortness of breath.  Have trouble breathing.  Have skin or nails that turn a bluish color.  Have very bad pain or stiffness in your neck.  Get a sudden headache.  Get sudden pain in your face or ear.  Cannot eat or drink without throwing up. Summary  Influenza ("the flu") is an infection in the lungs, nose, and throat. It is caused by a virus.  Take over-the-counter and prescription medicines only as told by your doctor.  Getting a flu shot every year is the best way to avoid getting the flu. This information is not intended to replace advice given to you by your health care provider. Make sure you  discuss any questions you have with your health care provider. Document Released: 11/25/2007 Document Revised: 08/03/2017 Document Reviewed: 08/03/2017 Elsevier Interactive Patient Education  2019 [employer].

## 2018-03-12 NOTE — Progress Notes (Signed)
MRN: [id number] DOB: 1980-11-21  Subjective:   Marisa Humphrey is a 38 y.o. female presenting for chief complaint of 1 days hx of sudden onset fever (Tmax 100.9), chills, body aches, and dry cough. Denies sinus pain, ear fullness, productive cough, wheezing, shortness of breath and chest pain, nausea, vomiting, abdominal pain and diarrhea. Has taken ibuprofen with relief.  Works at a pediatric office.  She has been exposed to the flu from family members and kids at work.  No PMH of seasonal allergies, asthma, COPD, hypertension, diabetes, autoimmune disease, thyroid disorder.  Patient had flu shot this season.  Denies smoking. Denies any other aggravating or relieving factors, no other questions or concerns.  Review of Systems  Respiratory: Negative for hemoptysis and sputum production.   Skin: Negative for rash.  Neurological: Negative for dizziness and headaches.    Marisa Humphrey has a current medication list which includes the following prescription(s): amphetamine-dextroamphetamine, bupropion, oseltamivir, and promethazine-dextromethorphan. Also is allergic to penicillins.  Marisa Humphrey  has a past medical history of Anxiety, Depression, External hemorrhoids without mention of complication, GERD (gastroesophageal reflux disease), History of condyloma acuminatum, varicella, and Postpartum care following vaginal delivery (7/26) (09/23/2013). Also  has a past surgical history that includes Laparoscopic gastric banding (11/09/2005) and Mole removal.   Objective:   Vitals: BP 116/80 (BP Location: Right Arm, Patient Position: Sitting, Cuff Size: Large)   Pulse (!) 101   Temp 99 F (37.2 C) (Oral)   Resp 20   Wt 273 lb 9.6 oz (124.1 kg)   SpO2 99%   BMI 42.85 kg/m   Physical Exam Vitals signs reviewed.  Constitutional:      General: She is not in acute distress.    Appearance: She is well-developed. She is not toxic-appearing.     Comments: Appears like she does not feel well  sitting on exam table.  HENT:     Head: Normocephalic and atraumatic.     Right Ear: Tympanic membrane, ear canal and external ear normal.     Left Ear: Tympanic membrane, ear canal and external ear normal.     Nose: Mucosal edema and congestion present.     Right Sinus: No maxillary sinus tenderness or frontal sinus tenderness.     Left Sinus: No maxillary sinus tenderness or frontal sinus tenderness.     Mouth/Throat:     Lips: Pink.     Mouth: Mucous membranes are moist.     Pharynx: Oropharynx is clear. Uvula midline. No pharyngeal swelling or posterior oropharyngeal erythema.     Tonsils: No tonsillar exudate or tonsillar abscesses. Swelling: 1+ on the right. 1+ on the left.  Eyes:     Conjunctiva/sclera: Conjunctivae normal.  Neck:     Musculoskeletal: Normal range of motion.  Cardiovascular:     Rate and Rhythm: Normal rate and regular rhythm.     Heart sounds: Normal heart sounds.  Pulmonary:     Effort: Pulmonary effort is normal.     Breath sounds: Normal breath sounds. No decreased breath sounds, wheezing, rhonchi or rales.  Lymphadenopathy:     Head:     Right side of head: No submental, submandibular, tonsillar, preauricular, posterior auricular or occipital adenopathy.     Left side of head: No submental, submandibular, tonsillar, preauricular, posterior auricular or occipital adenopathy.     Cervical: No cervical adenopathy.     Upper Body:     Right upper body: No supraclavicular adenopathy.     Left upper body:  No supraclavicular adenopathy.  Skin:    General: Skin is warm and dry.  Neurological:     Mental Status: She is alert.     Results for orders placed or performed in visit on 03/12/18 (from the past 24 hour(s))  POCT Influenza A/B     Status: Abnormal   Collection Time: 03/12/18 12:33 PM  Result Value Ref Range   Influenza A, POC Negative Negative   Influenza B, POC Negative Negative  POC Influenza A&B(BINAX/QUICKVUE)     Status: Abnormal    Collection Time: 03/12/18  1:07 PM  Result Value Ref Range   Influenza A, POC Positive (A) Negative   Influenza B, POC Negative Negative    Assessment and Plan :  1. Influenza A POCT influenza test positive for influenza A.  She is afebrile.  Looks as she does not feel well but is Discussed natural history of the disease and treatment options; including supportive care and antiviral therapy.  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  Tamiflu prescription provided.  Encouraged rest, hydration, and to continue OTC tylenol or ibuprofen as prescribed for fever. Educated on proper Comptroller. Recommended wearing a mask daily especially around other people. Remain out of work until afebrile for 48 hours w/o use of antipyretics. Educated on potential complications of the flu. Advised to follow up in clinic or with family doctor, local urgent care, or ED if develops any of these concerning symptoms or if current symptoms persist outside of discussed boundaries. - oseltamivir (TAMIFLU) 75 MG capsule; Take 1 capsule (75 mg total) by mouth 2 (two) times daily.  Dispense: 10 capsule; Refill: 0 - promethazine-dextromethorphan (PROMETHAZINE-DM) 6.25-15 MG/5ML syrup; Take 5 mLs by mouth 4 (four) times daily as needed for cough.  Dispense: 118 mL; Refill: 0  2. Body aches - POCT Influenza A/B   Tenna Delaine, PA-C  [facility] 03/12/2018 1:08 PM

## 2018-03-31 ENCOUNTER — Ambulatory Visit (INDEPENDENT_AMBULATORY_CARE_PROVIDER_SITE_OTHER): Payer: No Typology Code available for payment source | Admitting: Family Medicine

## 2018-03-31 ENCOUNTER — Encounter: Payer: Self-pay | Admitting: Family Medicine

## 2018-03-31 DIAGNOSIS — F988 Other specified behavioral and emotional disorders with onset usually occurring in childhood and adolescence: Secondary | ICD-10-CM

## 2018-03-31 DIAGNOSIS — F321 Major depressive disorder, single episode, moderate: Secondary | ICD-10-CM | POA: Diagnosis not present

## 2018-03-31 MED ORDER — AMPHETAMINE-DEXTROAMPHET ER 30 MG PO CP24: 30.0000 mg | ORAL_CAPSULE | ORAL | 0 refills | Status: DC

## 2018-03-31 MED ORDER — SERTRALINE HCL 50 MG PO TABS: 50.0000 mg | ORAL_TABLET | Freq: Every day | ORAL | 3 refills | Status: DC

## 2018-03-31 MED FILL — ADDERALL XR 30 MG CAP SA: 30 | 90 days supply | Qty: 90 | Fill #0

## 2018-03-31 MED FILL — SERTRALINE HCL 50 MG TABLET: 50 | 30 days supply | Qty: 30 | Fill #0

## 2018-03-31 NOTE — Progress Notes (Signed)
Patient ID: Marisa Humphrey, female    DOB: Jul 08, 1980  Age: 38 y.o. MRN: [id number]    Subjective:  Subjective  HPI Marisa Humphrey presents for multiple things ---she has a spot on her R shoulder that she would like [place] to look at  She also states she will go to healthy weight and wellness  She then broke down in tears and stated that she is under a tremendous amount of stress with work the kids and going through a divorce  She is looking for a Social worker and prefers a spiritually based counselor.    Review of Systems  Constitutional: Negative for appetite change, diaphoresis, fatigue and unexpected weight change.  Eyes: Negative for pain, redness and visual disturbance.  Respiratory: Negative for cough, chest tightness, shortness of breath and wheezing.   Cardiovascular: Negative for chest pain, palpitations and leg swelling.  Endocrine: Negative for cold intolerance, heat intolerance, polydipsia, polyphagia and polyuria.  Genitourinary: Negative for difficulty urinating, dysuria and frequency.  Neurological: Negative for dizziness, light-headedness, numbness and headaches.  Psychiatric/Behavioral: Positive for dysphoric mood and sleep disturbance. Negative for self-injury and suicidal ideas. The patient is not nervous/anxious.     History Past Medical History:  Diagnosis Date  . Anxiety   . Depression   . External hemorrhoids without mention of complication   . GERD (gastroesophageal reflux disease)   . History of condyloma acuminatum   . Hx of varicella   . Postpartum care following vaginal delivery (7/26) 09/23/2013    She has a past surgical history that includes Laparoscopic gastric banding (11/09/2005) and Mole removal.   Her family history includes Cancer in an other family member; Diabetes in her maternal grandmother and mother; Heart attack (age of onset: 21) in her father; Heart disease (age of onset: 69) in her father; Hyperlipidemia in her  mother; Hypertension in her mother; Multiple sclerosis in her brother.She reports that she has quit smoking. Her smoking use included cigarettes. She has a 8.00 pack-year smoking history. She has never used smokeless tobacco. She reports that she does not drink alcohol or use drugs.  Current Outpatient Medications on File Prior to Visit  Medication Sig Dispense Refill  . buPROPion (WELLBUTRIN XL) 300 MG 24 hr tablet Take 1 tablet (300 mg total) by mouth daily. 90 tablet 3   No current facility-administered medications on file prior to visit.      Objective:  Objective  Physical Exam Vitals signs and nursing note reviewed.  Constitutional:      Appearance: She is well-developed.  HENT:     Head: Normocephalic and atraumatic.  Eyes:     Conjunctiva/sclera: Conjunctivae normal.  Neck:     Musculoskeletal: Normal range of motion and neck supple.     Thyroid: No thyromegaly.     Vascular: No carotid bruit or JVD.  Cardiovascular:     Rate and Rhythm: Normal rate and regular rhythm.     Heart sounds: Normal heart sounds. No murmur.  Pulmonary:     Effort: Pulmonary effort is normal. No respiratory distress.     Breath sounds: Normal breath sounds. No wheezing or rales.  Chest:     Chest wall: No tenderness.  Neurological:     Mental Status: She is alert and oriented to person, place, and time.  Psychiatric:        Mood and Affect: Mood is depressed. Affect is tearful.        Speech: Speech normal.  Behavior: Behavior normal.        Thought Content: Thought content normal.        Cognition and Memory: Cognition normal.        Judgment: Judgment normal.    BP 136/86 (BP Location: Right Arm, Cuff Size: Large)   Pulse 82   Temp 98.5 F (36.9 C) (Oral)   Resp 16   Ht 5\' 7"  (1.702 m)   Wt 275 lb 3.2 oz (124.8 kg)   LMP 03/27/2018   SpO2 99%   BMI 43.10 kg/m  Wt Readings from Last 3 Encounters:  03/31/18 275 lb 3.2 oz (124.8 kg)  03/12/18 273 lb 9.6 oz (124.1 kg)    10/18/17 276 lb 6.4 oz (125.4 kg)     Lab Results  Component Value Date   WBC 10.5 01/20/2015   HGB 14.2 01/20/2015   HCT 43.4 01/20/2015   PLT 358 01/20/2015   GLUCOSE 100 (H) 01/20/2015   CHOL 137 12/27/2014   TRIG 67.0 12/27/2014   HDL 43.80 12/27/2014   LDLCALC 79 12/27/2014   ALT 17 12/27/2014   AST 15 12/27/2014   NA 139 01/20/2015   K 3.4 (L) 01/20/2015   CL 107 01/20/2015   CREATININE 1.02 (H) 01/20/2015   BUN 13 01/20/2015   CO2 25 01/20/2015   TSH 1.41 12/27/2014    Dg Knee Complete 4 Views Right  Result Date: 08/08/2017 CLINICAL DATA:  38 year old female with progressed chronic right knee pain and swelling over the past month. No known injury. EXAM: RIGHT KNEE - COMPLETE 4+ VIEW COMPARISON:  None. FINDINGS: Bone mineralization is within normal limits. Preserved joint spaces and alignment. Small suprapatellar joint effusion suspected. Patella intact. Minor tricompartmental degenerative spurring. No acute osseous abnormality identified. IMPRESSION: Small joint effusion with mild degenerative osseous changes at the right knee. No acute osseous abnormality identified. Electronically Signed   By: Genevie Ann M.D.   On: 08/08/2017 15:19     Assessment & Plan:  Plan  I have discontinued Dimond Crotty Solomon's oseltamivir and promethazine-dextromethorphan. I am also having her start on sertraline. Additionally, I am having her maintain her buPROPion and amphetamine-dextroamphetamine.  Meds ordered this encounter  Medications  . amphetamine-dextroamphetamine (ADDERALL XR) 30 MG 24 hr capsule    Sig: Take 1 capsule (30 mg total) by mouth every morning.    Dispense:  90 capsule    Refill:  0  . sertraline (ZOLOFT) 50 MG tablet    Sig: Take 1 tablet (50 mg total) by mouth daily.    Dispense:  30 tablet    Refill:  3    Problem List Items Addressed This Visit      Unprioritized   Attention deficit disorder   Relevant Medications   amphetamine-dextroamphetamine  (ADDERALL XR) 30 MG 24 hr capsule   Attention deficit 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    Stable Refill meds      Depression, major, single episode, moderate ([place])    Start zoloft con't wellbutrin Pt given names and numbers of counselors including [facility] place rto 1 month or sooner prn Pt is not suicidal  Pt here >30 min and > 50% face to face discussing the depression and treatment       Relevant Medications   sertraline (ZOLOFT) 50 MG tablet   Morbid obesity (HCC) - Primary (Chronic)   Relevant Medications   amphetamine-dextroamphetamine (ADDERALL XR) 30 MG 24 hr capsule   sertraline (ZOLOFT) 50 MG tablet   Other Relevant Orders  Amb Ref to Medical Weight Management      Follow-up: Return in about 4 weeks (around 04/28/2018), or if symptoms worsen or fail to improve, for depression.  Ann Held, DO

## 2018-03-31 NOTE — Patient Instructions (Signed)
Persistent Depressive Disorder, Adult  Persistent depressive disorder (PDD) is a mental health condition that causes symptoms of low-level depression for 2 years or longer. It may also be called long-term (chronic) depression or dysthymia. PDD may include episodes of more severe depression that last for about 2 weeks (major depressive disorder or MDD). PDD can affect the way you think, feel, and sleep. This condition may also affect your relationships. You may be more likely to get sick if you have PDD.  What are the causes?  The exact cause of this condition is not known. PDD is most likely caused by a combination of things, which may include:  Genetic factors. These are traits that are passed along from parent to child.  Individual factors. Your personality, your behavior, and the way you handle your thoughts and feelings may contribute to PDD. This includes personality traits and behaviors learned from others.  Physical factors, such as:  Differences in the part of your brain that controls emotion. This part of your brain may be different than it is in people who do not have PDD.  Long-term (chronic) medical or psychiatric illnesses.  Social factors. Traumatic experiences or major life changes may play a role in the development of PDD.  What increases the risk?  This condition is more likely to develop in women. The following factors may make you more likely to develop PDD:  A family history of depression.  Abnormally low levels of certain brain chemicals.  Traumatic events in childhood, especially abuse or the loss of a parent.  Being under a lot of stress, or long-term stress, especially from upsetting life experiences or losses.  A history of:  Chronic physical illness.  Other mental health disorders.  Substance abuse.  Poor living conditions.  Experiencing social exclusion or discrimination on a regular basis.  What are the signs or symptoms?  Symptoms of this condition occur for most of the day, and may  include:  Fatigue or low energy.  Eating too much or too little.  Sleeping too much or too little.  Restlessness or agitation.  Feelings of hopelessness.  Feeling worthless or guilty.  Anxiety.  Poor concentration or difficulty making decisions.  Low self-esteem.  Negative outlook.  Inability to have fun or experience pleasure.  Social withdrawal.  Unexplained physical complaints.  Irritability.  Aggressive behavior or anger.  How is this diagnosed?  This condition may be diagnosed based on:  Your symptoms.  Your medical history, including your mental health history. This may involve tests to evaluate your mental health. You may be asked questions about your lifestyle, including any drug and alcohol use, and how long you have had symptoms of PDD.  A physical exam.  Blood tests to rule out other conditions.  You may be diagnosed with PDD if you have had a depressed mood for 2 years or longer, as well as other symptoms of depression.  How is this treated?  This condition is usually treated by mental health professionals, such as psychologists, psychiatrists, and clinical social workers. You may need more than one type of treatment. Treatment may include:  Psychotherapy. This is also called talk therapy or counseling. Types of psychotherapy include:  Cognitive behavioral therapy (CBT). This type of therapy teaches you to recognize unhealthy feelings, thoughts, and behaviors, and replace them with positive thoughts and actions.  Interpersonal therapy (IPT). This helps you to improve the way you relate to and communicate with others.  Family therapy. This treatment includes members of   your family.  Medicine to treat anxiety and depression, or to help you control certain emotions and behaviors.  Lifestyle changes, such as:  Limiting alcohol and drug use.  Exercising regularly.  Getting plenty of sleep.  Making healthy eating choices.  Spending more time outdoors.    Follow these instructions at home:  Activity  Return to  your normal activities as told by your health care provider.  Exercise regularly and spend time outdoors as told by your health care provider.  General instructions  Take over-the-counter and prescription medicines only as told by your health care provider.  Do not drink alcohol. If you drink alcohol, limit your alcohol intake to no more than 1 drink a day for nonpregnant women and 2 drinks a day for men. One drink equals 12 oz of beer, 5 oz of wine, or 1½ oz of hard liquor. Alcohol can affect any antidepressant medicines you are taking. Talk to your health care provider about your alcohol use.  Eat a healthy diet and get plenty of sleep.  Find activities that you enjoy doing, and make time to do them.  Consider joining a support group. Your health care provider may be able to recommend a support group.  Keep all follow-up visits as told by your health care provider. This is important.  Where to find more information  National Alliance on Mental Illness  www.nami.org  [place]. National Institute of Mental Health  www.nimh.nih.gov  National Suicide Prevention Lifeline  1-800-273-TALK (8255). This is free, 24-hour help.  Contact a health care provider if:  Your symptoms get worse.  You develop new symptoms.  You have trouble sleeping or doing your daily activities.  Get help right away if:  You self-harm.  You have serious thoughts about hurting yourself or others.  You see, hear, taste, smell, or feel things that are not present (hallucinate).  This information is not intended to replace advice given to you by your health care provider. Make sure you discuss any questions you have with your health care provider.  Document Released: 02/02/2012 Document Revised: 10/16/2015 Document Reviewed: 08/30/2015  Elsevier Interactive Patient Education © 2019 Elsevier Inc.

## 2018-04-01 DIAGNOSIS — F988 Other specified behavioral and emotional disorders with onset usually occurring in childhood and adolescence: Secondary | ICD-10-CM | POA: Insufficient documentation

## 2018-04-01 NOTE — Assessment & Plan Note (Signed)
Stable. Refill meds

## 2018-04-01 NOTE — Assessment & Plan Note (Signed)
Start zoloft con't wellbutrin Pt given names and numbers of counselors including [facility] rto 1 month or sooner prn Pt is not suicidal  Pt here >30 min and > 50% face to face discussing the depression and treatment

## 2018-04-28 ENCOUNTER — Ambulatory Visit: Payer: No Typology Code available for payment source | Admitting: Family Medicine

## 2018-05-18 ENCOUNTER — Other Ambulatory Visit: Payer: Self-pay | Admitting: Family Medicine

## 2018-05-18 ENCOUNTER — Encounter: Payer: Self-pay | Admitting: Family Medicine

## 2018-05-18 DIAGNOSIS — F419 Anxiety disorder, unspecified: Secondary | ICD-10-CM

## 2018-05-18 MED ORDER — ALPRAZOLAM 0.25 MG PO TABS: 0.2500 mg | ORAL_TABLET | Freq: Two times a day (BID) | ORAL | 0 refills | Status: DC | PRN

## 2018-05-18 MED ORDER — SERTRALINE HCL 100 MG PO TABS: 100.0000 mg | ORAL_TABLET | Freq: Every day | ORAL | 3 refills | Status: DC

## 2018-05-18 MED FILL — SERTRALINE HCL 100 MG TAB: 100 | 30 days supply | Qty: 30 | Fill #0

## 2018-05-18 MED FILL — ALPRAZolam 0.25 MG TABS: 0.25 | 10 days supply | Qty: 20 | Fill #0

## 2018-05-18 NOTE — Telephone Encounter (Signed)
Inc zoloft 100 mg  #30  1 po qd   2 refills Xanax 0.15 mg  #30  1 po tid prn  I have sent them to the pharmacy

## 2018-05-19 MED ORDER — SERTRALINE HCL 100 MG PO TABS: 100.0000 mg | ORAL_TABLET | Freq: Every day | ORAL | 3 refills | Status: DC

## 2018-05-19 NOTE — Telephone Encounter (Signed)
Please resend the rx for me--- Marisa Humphrey

## 2018-05-19 NOTE — Telephone Encounter (Signed)
Please re send the rx --- thanks

## 2018-05-19 NOTE — Telephone Encounter (Signed)
I resent the Zoloft- unable to resend Xanax.

## 2018-05-26 ENCOUNTER — Encounter: Payer: No Typology Code available for payment source | Admitting: Family Medicine

## 2018-05-29 ENCOUNTER — Encounter: Payer: Self-pay | Admitting: *Deleted

## 2018-06-21 ENCOUNTER — Telehealth: Payer: Self-pay

## 2018-06-21 NOTE — Telephone Encounter (Signed)
Copied from [place] [id number]. Topic: General - Other >> Jun 20, 2018  4:49 PM Wynetta Emery, [place] C wrote: Reason for CRM: pt called in to schedule a f/u per mychart message.    CB: [phone]

## 2018-06-21 NOTE — Telephone Encounter (Signed)
Called patient to schedule appointment. Left message for return call.

## 2018-06-22 ENCOUNTER — Other Ambulatory Visit: Payer: Self-pay

## 2018-06-22 ENCOUNTER — Encounter: Payer: Self-pay | Admitting: Family Medicine

## 2018-06-22 ENCOUNTER — Ambulatory Visit (INDEPENDENT_AMBULATORY_CARE_PROVIDER_SITE_OTHER): Payer: No Typology Code available for payment source | Admitting: Family Medicine

## 2018-06-22 DIAGNOSIS — F988 Other specified behavioral and emotional disorders with onset usually occurring in childhood and adolescence: Secondary | ICD-10-CM | POA: Diagnosis not present

## 2018-06-22 DIAGNOSIS — F419 Anxiety disorder, unspecified: Secondary | ICD-10-CM | POA: Diagnosis not present

## 2018-06-22 MED ORDER — SERTRALINE HCL 100 MG PO TABS: 100.0000 mg | ORAL_TABLET | Freq: Every day | ORAL | 3 refills | Status: DC

## 2018-06-22 MED ORDER — AMPHETAMINE-DEXTROAMPHET ER 30 MG PO CP24: 30.0000 mg | ORAL_CAPSULE | ORAL | 0 refills | Status: DC

## 2018-06-22 MED ORDER — ALPRAZOLAM 0.25 MG PO TABS: 0.2500 mg | ORAL_TABLET | Freq: Two times a day (BID) | ORAL | 0 refills | Status: DC | PRN

## 2018-06-22 NOTE — Progress Notes (Signed)
Virtual Visit via Video Note  I connected with Marisa Humphrey on 06/22/18 at  1:30 PM EDT by a video enabled telemedicine application and verified that I am speaking with the correct person using two identifiers.   I discussed the limitations of evaluation and management by telemedicine and the availability of in person appointments. The patient expressed understanding and agreed to proceed.  History of Present Illness: Pt is home in her car and needs f/u anxiety and add.  She is in counseling now and things are going well.  No complaints.     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 Anxiety   . Depression   . External hemorrhoids without mention of complication   . GERD (gastroesophageal reflux disease)   . History of condyloma acuminatum   . Hx of varicella   . Postpartum care following vaginal delivery (7/26) 09/23/2013   Outpatient Encounter Medications as of 06/22/2018  Medication Sig  . ALPRAZolam (XANAX) 0.25 MG tablet Take 1 tablet (0.25 mg total) by mouth 2 (two) times daily as needed for anxiety.  Marland Kitchen amphetamine-dextroamphetamine (ADDERALL XR) 30 MG 24 hr capsule Take 1 capsule (30 mg total) by mouth every morning.  Marland Kitchen amphetamine-dextroamphetamine (ADDERALL XR) 30 MG 24 hr capsule Take 1 capsule (30 mg total) by mouth every morning.  Marland Kitchen amphetamine-dextroamphetamine (ADDERALL XR) 30 MG 24 hr capsule Take 1 capsule (30 mg total) by mouth every morning.  Marland Kitchen buPROPion (WELLBUTRIN XL) 300 MG 24 hr tablet Take 1 tablet (300 mg total) by mouth daily.  . sertraline (ZOLOFT) 100 MG tablet Take 1 tablet (100 mg total) by mouth daily.  . [DISCONTINUED] ALPRAZolam (XANAX) 0.25 MG tablet Take 1 tablet (0.25 mg total) by mouth 2 (two) times daily as needed for anxiety.  . [DISCONTINUED] amphetamine-dextroamphetamine (ADDERALL XR) 30 MG 24 hr capsule Take 1 capsule (30 mg total) by mouth every morning.  . [DISCONTINUED] sertraline (ZOLOFT) 100 MG tablet  Take 1 tablet (100 mg total) by mouth daily.   No facility-administered encounter medications on file as of 06/22/2018.    Observations/Objective: Unable to get vitals due to video visit and pt is outside in her car. Pt is in NAD No sob rr normal    Assessment and Plan:  1. Attention deficit disorder, unspecified hyperactivity presence Stable,  con't meds  - amphetamine-dextroamphetamine (ADDERALL XR) 30 MG 24 hr capsule; Take 1 capsule (30 mg total) by mouth every morning.  Dispense: 30 capsule; Refill: 0 - amphetamine-dextroamphetamine (ADDERALL XR) 30 MG 24 hr capsule; Take 1 capsule (30 mg total) by mouth every morning.  Dispense: 30 capsule; Refill: 0 - amphetamine-dextroamphetamine (ADDERALL XR) 30 MG 24 hr capsule; Take 1 capsule (30 mg total) by mouth every morning.  Dispense: 90 capsule; Refill: 0  2. Anxiety Stable , con't meds  - ALPRAZolam (XANAX) 0.25 MG tablet; Take 1 tablet (0.25 mg total) by mouth 2 (two) times daily as needed for anxiety.  Dispense: 20 tablet; Refill: 0 - sertraline (ZOLOFT) 100 MG tablet; Take 1 tablet (100 mg total) by mouth daily.  Dispense: 90 tablet; Refill: 3  Follow Up Instructions:    I discussed the assessment and treatment plan with the patient. The patient was provided an opportunity to ask questions and all were answered. The patient agreed with the plan and demonstrated an understanding of the instructions.   The patient was advised to call back or seek an in-person evaluation if the symptoms worsen  or if the condition fails to improve as anticipated.  I provided 15 minutes of non-face-to-face time during this encounter.   Ann Held, DO

## 2018-07-14 ENCOUNTER — Telehealth: Payer: Self-pay

## 2018-07-14 ENCOUNTER — Other Ambulatory Visit: Payer: Self-pay | Admitting: Family Medicine

## 2018-07-14 DIAGNOSIS — F988 Other specified behavioral and emotional disorders with onset usually occurring in childhood and adolescence: Secondary | ICD-10-CM

## 2018-07-14 MED ORDER — AMPHETAMINE-DEXTROAMPHET ER 30 MG PO CP24: 30.0000 mg | ORAL_CAPSULE | ORAL | 0 refills | Status: DC

## 2018-07-14 MED FILL — ADDERALL XR 30 MG CAP SA: 30 | 90 days supply | Qty: 90 | Fill #0

## 2018-07-14 NOTE — Telephone Encounter (Signed)
Insurance will not cover Rx's at an outside pharmacy such as [facility]. Pt requesting Adderall be resent to [facility].

## 2018-07-14 NOTE — Telephone Encounter (Signed)
done

## 2018-07-14 NOTE — Telephone Encounter (Signed)
Copied from [place] [id number]. Topic: General - Inquiry >> Jul 14, 2018 11:40 AM Richardo Priest, NT wrote: Reason for CRM: Patient is calling in stating insurance will not cover the amphetamine-dextroamphetamine (ADDERALL XR) 30 MG 24 hr capsule. Requesting the medication be sent to the [facility] long outpatient pharmacy. Call back is [phone].

## 2018-08-09 ENCOUNTER — Other Ambulatory Visit: Payer: Self-pay | Admitting: Family Medicine

## 2018-08-09 DIAGNOSIS — F419 Anxiety disorder, unspecified: Secondary | ICD-10-CM

## 2018-08-10 ENCOUNTER — Other Ambulatory Visit: Payer: Self-pay | Admitting: Family Medicine

## 2018-08-10 DIAGNOSIS — F419 Anxiety disorder, unspecified: Secondary | ICD-10-CM

## 2018-08-10 NOTE — Telephone Encounter (Signed)
Requesting: Xanax Contract: 04/25/2017 UDS: 10/18/2017, low risk, next screen 04/20/2018 Last OV: 06/22/2018 Next OV: N/A Last Refill: 06/22/2018, #20--0 RF Database:   Please advise

## 2018-08-11 MED ORDER — ALPRAZOLAM 0.25 MG PO TABS: 0.2500 mg | ORAL_TABLET | Freq: Two times a day (BID) | ORAL | 0 refills | Status: DC | PRN

## 2018-08-11 MED FILL — ALPRAZolam 0.25 MG TABS: 0.25 | 10 days supply | Qty: 20 | Fill #0

## 2018-08-11 NOTE — Telephone Encounter (Signed)
Last written: 06/22/18 Last ov: 06/22/18 Next ov: none Contract: 10/19/18 UDS: ok to do in july

## 2018-09-21 ENCOUNTER — Other Ambulatory Visit: Payer: Self-pay | Admitting: Family Medicine

## 2018-09-21 DIAGNOSIS — F419 Anxiety disorder, unspecified: Secondary | ICD-10-CM

## 2018-09-21 MED ORDER — ALPRAZOLAM 0.25 MG PO TABS: 0.2500 mg | ORAL_TABLET | Freq: Two times a day (BID) | ORAL | 0 refills | Status: DC | PRN

## 2018-09-21 MED FILL — ALPRAZolam 0.25 MG TABS: 0.25 | 10 days supply | Qty: 20 | Fill #0

## 2018-09-21 NOTE — Telephone Encounter (Signed)
Requesting: Xanax Contract: 04/25/2017 UDS: 10/18/2017, low risk, next screen 04/20/2018 Last OV: 06/22/2018 Next OV: N/A Last Refill: 08/11/2018, #20--0 RF Database:   Please advise

## 2018-09-26 ENCOUNTER — Other Ambulatory Visit: Payer: Self-pay

## 2018-10-13 ENCOUNTER — Ambulatory Visit (INDEPENDENT_AMBULATORY_CARE_PROVIDER_SITE_OTHER): Payer: No Typology Code available for payment source | Admitting: Family Medicine

## 2018-10-13 ENCOUNTER — Encounter: Payer: Self-pay | Admitting: Family Medicine

## 2018-10-13 ENCOUNTER — Other Ambulatory Visit: Payer: Self-pay

## 2018-10-13 VITALS — BP 141/78 | HR 74 | Temp 97.1°F | Resp 18 | Ht 67.0 in | Wt 271.6 lb

## 2018-10-13 DIAGNOSIS — Z0000 Encounter for general adult medical examination without abnormal findings: Secondary | ICD-10-CM

## 2018-10-13 DIAGNOSIS — Z79899 Other long term (current) drug therapy: Secondary | ICD-10-CM | POA: Diagnosis not present

## 2018-10-13 DIAGNOSIS — F419 Anxiety disorder, unspecified: Secondary | ICD-10-CM | POA: Diagnosis not present

## 2018-10-13 DIAGNOSIS — F988 Other specified behavioral and emotional disorders with onset usually occurring in childhood and adolescence: Secondary | ICD-10-CM | POA: Diagnosis not present

## 2018-10-13 MED ORDER — SERTRALINE HCL 100 MG PO TABS: 100.0000 mg | ORAL_TABLET | Freq: Every day | ORAL | 3 refills | Status: DC

## 2018-10-13 MED ORDER — AMPHETAMINE-DEXTROAMPHET ER 30 MG PO CP24: 30.0000 mg | ORAL_CAPSULE | ORAL | 0 refills | Status: DC

## 2018-10-13 MED ORDER — ALPRAZOLAM 0.25 MG PO TABS: 0.2500 mg | ORAL_TABLET | Freq: Two times a day (BID) | ORAL | 0 refills | Status: DC | PRN

## 2018-10-13 MED ORDER — BUPROPION HCL ER (XL) 300 MG PO TB24: 300.0000 mg | ORAL_TABLET | Freq: Every day | ORAL | 3 refills | Status: DC

## 2018-10-13 MED FILL — ADDERALL XR 30 MG CAP SA: 30 | 30 days supply | Qty: 30 | Fill #0

## 2018-10-13 MED FILL — buPROPion HCL ER (XL) 300 M: 300 | 90 days supply | Qty: 90 | Fill #0

## 2018-10-13 MED FILL — ALPRAZolam 0.25 MG TABS: 0.25 | 10 days supply | Qty: 20 | Fill #0

## 2018-10-13 MED FILL — SERTRALINE HCL 100 MG TABS: 100 | 90 days supply | Qty: 90 | Fill #0

## 2018-10-13 NOTE — Progress Notes (Signed)
Subjective:     Marisa Humphrey is a 38 y.o. female and is here for a comprehensive physical exam. The patient reports no problems.  Social History   Socioeconomic History  . Marital status: Married    Spouse name: Not on file  . Number of children: 1  . Years of education: Not on file  . Highest education level: Not on file  Occupational History  . Occupation: Optician, dispensing: [employer]    Employer: [employer]  Social Needs  . Financial resource strain: Not on file  . Food insecurity    Worry: Not on file    Inability: Not on file  . Transportation needs    Medical: Not on file    Non-medical: Not on file  Tobacco Use  . Smoking status: Former Smoker    Packs/day: 0.50    Years: 16.00    Pack years: 8.00    Types: Cigarettes  . Smokeless tobacco: Never Used  Substance and Sexual Activity  . Alcohol use: No  . Drug use: No  . Sexual activity: Yes    Partners: Male  Lifestyle  . Physical activity    Days per week: Not on file    Minutes per session: Not on file  . Stress: Not on file  Relationships  . Social Herbalist on phone: Not on file    Gets together: Not on file    Attends religious service: Not on file    Active member of club or organization: Not on file    Attends meetings of clubs or organizations: Not on file    Relationship status: Not on file  . Intimate partner violence    Fear of current or ex partner: Not on file    Emotionally abused: Not on file    Physically abused: Not on file    Forced sexual activity: Not on file  Other Topics Concern  . Not on file  Social History Narrative   Exercising --no   Health Maintenance  Topic Date Due  . INFLUENZA VACCINE  09/30/2018  . PAP SMEAR-Modifier  10/09/2021  . TETANUS/TDAP  07/18/2023  . HIV Screening  Completed    The following portions of the patient's history were reviewed and updated as appropriate:  She  has a past medical history of Anxiety,  Depression, External hemorrhoids without mention of complication, GERD (gastroesophageal reflux disease), History of condyloma acuminatum, varicella, and Postpartum care following vaginal delivery (7/26) (09/23/2013). She does not have any pertinent problems on file. She  has a past surgical history that includes Laparoscopic gastric banding (11/09/2005) and Mole removal. Her family history includes Cancer in an other family member; Diabetes in her maternal grandmother and mother; Heart attack (age of onset: 58) in her father; Heart disease (age of onset: 22) in her father; Hyperlipidemia in her mother; Hypertension in her mother; Multiple sclerosis in her brother. She  reports that she has quit smoking. Her smoking use included cigarettes. She has a 8.00 pack-year smoking history. She has never used smokeless tobacco. She reports that she does not drink alcohol or use drugs. She has a current medication list which includes the following prescription(s): alprazolam, amphetamine-dextroamphetamine, amphetamine-dextroamphetamine, amphetamine-dextroamphetamine, bupropion, and sertraline. No current outpatient medications on file prior to visit.   No current facility-administered medications on file prior to visit.    She is allergic to penicillins..  Review of Systems Review of Systems  Constitutional: Negative for  activity change, appetite change and fatigue.  HENT: Negative for hearing loss, congestion, tinnitus and ear discharge.  dentist q59m Eyes: Negative for visual disturbance (see optho q1y -- vision corrected to 20/20 with glasses).  Respiratory: Negative for cough, chest tightness and shortness of breath.   Cardiovascular: Negative for chest pain, palpitations and leg swelling.  Gastrointestinal: Negative for abdominal pain, diarrhea, constipation and abdominal distention.  Genitourinary: Negative for urgency, frequency, decreased urine volume and difficulty urinating.  Musculoskeletal:  Negative for back pain, arthralgias and gait problem.  Skin: Negative for color change, pallor and rash.  Neurological: Negative for dizziness, light-headedness, numbness and headaches.  Hematological: Negative for adenopathy. Does not bruise/bleed easily.  Psychiatric/Behavioral: Negative for suicidal ideas, confusion, sleep disturbance, self-injury, dysphoric mood, decreased concentration and agitation.       Objective:    BP (!) 141/78 (BP Location: Left Arm, Patient Position: Sitting, Cuff Size: Normal)   Pulse 74   Temp (!) 97.1 F (36.2 C) (Temporal)   Resp 18   Ht 5\' 7"  (1.702 m)   Wt 271 lb 9.6 oz (123.2 kg)   LMP 07/14/2018   SpO2 100%   BMI 42.54 kg/m  General appearance: alert, cooperative, appears stated age and no distress Head: Normocephalic, without obvious abnormality, atraumatic Eyes: negative findings: lids and lashes normal, conjunctivae and sclerae normal and pupils equal, round, reactive to light and accomodation Ears: normal TM's and external ear canals both ears Nose: Nares normal. Septum midline. Mucosa normal. No drainage or sinus tenderness. Throat: lips, mucosa, and tongue normal; teeth and gums normal Neck: no adenopathy, no carotid bruit, no JVD, supple, symmetrical, trachea midline and thyroid not enlarged, symmetric, no tenderness/mass/nodules Back: symmetric, no curvature. ROM normal. No CVA tenderness. Lungs: clear to auscultation bilaterally Breasts: gyn Heart: regular rate and rhythm, S1, S2 normal, no murmur, click, rub or gallop Abdomen: soft, non-tender; bowel sounds normal; no masses,  no organomegaly Pelvic: deferred--gyn Extremities: extremities normal, atraumatic, no cyanosis or edema Pulses: 2+ and symmetric Skin: Skin color, texture, turgor normal. No rashes or lesions Lymph nodes: Cervical, supraclavicular, and axillary nodes normal. Neurologic: Alert and oriented X 3, normal strength and tone. Normal symmetric reflexes. Normal  coordination and gait    Assessment:    Healthy female exam.      Plan:    ghm utd  Check labs See After Visit Summary for Counseling Recommendations    1. Anxiety Stable con't prn med - ALPRAZolam (XANAX) 0.25 MG tablet; Take 1 tablet (0.25 mg total) by mouth 2 (two) times daily as needed for anxiety.  Dispense: 20 tablet; Refill: 0 - sertraline (ZOLOFT) 100 MG tablet; Take 1 tablet (100 mg total) by mouth daily.  Dispense: 90 tablet; Refill: 3  2. Attention deficit disorder, unspecified hyperactivity presence Stable Refill meds rto 6 months or sooner prn uds and contract today Database reviewed - amphetamine-dextroamphetamine (ADDERALL XR) 30 MG 24 hr capsule; Take 1 capsule (30 mg total) by mouth every morning.  Dispense: 90 capsule; Refill: 0 - amphetamine-dextroamphetamine (ADDERALL XR) 30 MG 24 hr capsule; Take 1 capsule (30 mg total) by mouth every morning.  Dispense: 30 capsule; Refill: 0 - amphetamine-dextroamphetamine (ADDERALL XR) 30 MG 24 hr capsule; Take 1 capsule (30 mg total) by mouth every morning.  Dispense: 30 capsule; Refill: 0 - buPROPion (WELLBUTRIN XL) 300 MG 24 hr tablet; Take 1 tablet (300 mg total) by mouth daily.  Dispense: 90 tablet; Refill: 3  3. Preventative health care ghm utd Check  labs  See AVS  - Lipid panel - CBC with Differential/Platelet - TSH - Comprehensive metabolic panel

## 2018-10-13 NOTE — Patient Instructions (Signed)
Preventive Care 21-36 Years Old, Female Preventive care refers to visits with your health care provider and lifestyle choices that can promote health and wellness. This includes:  A yearly physical exam. This may also be called an annual well check.  Regular dental visits and eye exams.  Immunizations.  Screening for certain conditions.  Healthy lifestyle choices, such as eating a healthy diet, getting regular exercise, not using drugs or products that contain nicotine and tobacco, and limiting alcohol use. What can I expect for my preventive care visit? Physical exam Your health care provider will check your:  Height and weight. This may be used to calculate body mass index (BMI), which tells if you are at a healthy weight.  Heart rate and blood pressure.  Skin for abnormal spots. Counseling Your health care provider may ask you questions about your:  Alcohol, tobacco, and drug use.  Emotional well-being.  Home and relationship well-being.  Sexual activity.  Eating habits.  Work and work environment.  Method of birth control.  Menstrual cycle.  Pregnancy history. What immunizations do I need?  Influenza (flu) vaccine  This is recommended every year. Tetanus, diphtheria, and pertussis (Tdap) vaccine  You may need a Td booster every 10 years. Varicella (chickenpox) vaccine  You may need this if you have not been vaccinated. Human papillomavirus (HPV) vaccine  If recommended by your health care provider, you may need three doses over 6 months. Measles, mumps, and rubella (MMR) vaccine  You may need at least one dose of MMR. You may also need a second dose. Meningococcal conjugate (MenACWY) vaccine  One dose is recommended if you are age 19-21 years and a first-year college student living in a residence hall, or if you have one of several medical conditions. You may also need additional booster doses. Pneumococcal conjugate (PCV13) vaccine  You may need  this if you have certain conditions and were not previously vaccinated. Pneumococcal polysaccharide (PPSV23) vaccine  You may need one or two doses if you smoke cigarettes or if you have certain conditions. Hepatitis A vaccine  You may need this if you have certain conditions or if you travel or work in places where you may be exposed to hepatitis A. Hepatitis B vaccine  You may need this if you have certain conditions or if you travel or work in places where you may be exposed to hepatitis B. Haemophilus influenzae type b (Hib) vaccine  You may need this if you have certain conditions. You may receive vaccines as individual doses or as more than one vaccine together in one shot (combination vaccines). Talk with your health care provider about the risks and benefits of combination vaccines. What tests do I need?  Blood tests  Lipid and cholesterol levels. These may be checked every 5 years starting at age 20.  Hepatitis C test.  Hepatitis B test. Screening  Diabetes screening. This is done by checking your blood sugar (glucose) after you have not eaten for a while (fasting).  Sexually transmitted disease (STD) testing.  BRCA-related cancer screening. This may be done if you have a family history of breast, ovarian, tubal, or peritoneal cancers.  Pelvic exam and Pap test. This may be done every 3 years starting at age 21. Starting at age 30, this may be done every 5 years if you have a Pap test in combination with an HPV test. Talk with your health care provider about your test results, treatment options, and if necessary, the need for more tests.   Follow these instructions at home: Eating and drinking   Eat a diet that includes fresh fruits and vegetables, whole grains, lean protein, and low-fat dairy.  Take vitamin and mineral supplements as recommended by your health care provider.  Do not drink alcohol if: ? Your health care provider tells you not to drink. ? You are  pregnant, may be pregnant, or are planning to become pregnant.  If you drink alcohol: ? Limit how much you have to 0-1 drink a day. ? Be aware of how much alcohol is in your drink. In the [place] one drink equals one 12 oz bottle of beer (355 mL), one 5 oz glass of wine (148 mL), or one 1 oz glass of hard liquor (44 mL). Lifestyle  Take daily care of your teeth and gums.  Stay active. Exercise for at least 30 minutes on 5 or more days each week.  Do not use any products that contain nicotine or tobacco, such as cigarettes, e-cigarettes, and chewing tobacco. If you need help quitting, ask your health care provider.  If you are sexually active, practice safe sex. Use a condom or other form of birth control (contraception) in order to prevent pregnancy and STIs (sexually transmitted infections). If you plan to become pregnant, see your health care provider for a preconception visit. What's next?  Visit your health care provider once a year for a well check visit.  Ask your health care provider how often you should have your eyes and teeth checked.  Stay up to date on all vaccines. This information is not intended to replace advice given to you by your health care provider. Make sure you discuss any questions you have with your health care provider. Document Released: 04/13/2001 Document Revised: 10/27/2017 Document Reviewed: 10/27/2017 Elsevier Patient Education  2020 Elsevier Inc.  

## 2018-10-14 LAB — CBC WITH DIFFERENTIAL/PLATELET
Absolute Monocytes: 655 cells/uL (ref 200–950)
Basophils Absolute: 57 cells/uL (ref 0–200)
Basophils Relative: 0.5 %
Eosinophils Absolute: 271 cells/uL (ref 15–500)
Eosinophils Relative: 2.4 %
HCT: 36.9 % (ref 35.0–45.0)
Hemoglobin: 12.3 g/dL (ref 11.7–15.5)
Lymphs Abs: 2656 cells/uL (ref 850–3900)
MCH: 29.1 pg (ref 27.0–33.0)
MCHC: 33.3 g/dL (ref 32.0–36.0)
MCV: 87.2 fL (ref 80.0–100.0)
MPV: 10.4 fL (ref 7.5–12.5)
Monocytes Relative: 5.8 %
Neutro Abs: 7661 cells/uL (ref 1500–7800)
Neutrophils Relative %: 67.8 %
Platelets: 375 10*3/uL (ref 140–400)
RBC: 4.23 10*6/uL (ref 3.80–5.10)
RDW: 12.2 % (ref 11.0–15.0)
Total Lymphocyte: 23.5 %
WBC: 11.3 10*3/uL — ABNORMAL HIGH (ref 3.8–10.8)

## 2018-10-14 LAB — COMPREHENSIVE METABOLIC PANEL
AG Ratio: 1.6 (calc) (ref 1.0–2.5)
ALT: 7 U/L (ref 6–29)
AST: 9 U/L — ABNORMAL LOW (ref 10–30)
Albumin: 3.9 g/dL (ref 3.6–5.1)
Alkaline phosphatase (APISO): 51 U/L (ref 31–125)
BUN: 9 mg/dL (ref 7–25)
CO2: 25 mmol/L (ref 20–32)
Calcium: 9.6 mg/dL (ref 8.6–10.2)
Chloride: 105 mmol/L (ref 98–110)
Creat: 0.9 mg/dL (ref 0.50–1.10)
Globulin: 2.5 g/dL (calc) (ref 1.9–3.7)
Glucose, Bld: 62 mg/dL — ABNORMAL LOW (ref 65–99)
Potassium: 4.4 mmol/L (ref 3.5–5.3)
Sodium: 139 mmol/L (ref 135–146)
Total Bilirubin: 0.3 mg/dL (ref 0.2–1.2)
Total Protein: 6.4 g/dL (ref 6.1–8.1)

## 2018-10-14 LAB — LIPID PANEL
Cholesterol: 111 mg/dL (ref <200)
HDL: 46 mg/dL — ABNORMAL LOW (ref >=50)
LDL Cholesterol (Calc): 46 mg/dL (calc)
Non-HDL Cholesterol (Calc): 65 mg/dL (calc) (ref <130)
Total CHOL/HDL Ratio: 2.4 (calc) (ref <5.0)
Triglycerides: 103 mg/dL (ref <150)

## 2018-10-14 LAB — TSH: TSH: 2.11 mIU/L

## 2018-10-16 LAB — PAIN MGMT, PROFILE 8 W/CONF, U
6 Acetylmorphine: NEGATIVE ng/mL
Alcohol Metabolites: NEGATIVE ng/mL (ref <500)
Alphahydroxyalprazolam: 25 ng/mL
Alphahydroxymidazolam: NEGATIVE ng/mL
Alphahydroxytriazolam: NEGATIVE ng/mL
Aminoclonazepam: NEGATIVE ng/mL
Amphetamine: 23809 ng/mL
Amphetamines: POSITIVE ng/mL
Benzodiazepines: POSITIVE ng/mL
Buprenorphine, Urine: NEGATIVE ng/mL
Cocaine Metabolite: NEGATIVE ng/mL
Creatinine: 216.7 mg/dL
Hydroxyethylflurazepam: NEGATIVE ng/mL
Lorazepam: NEGATIVE ng/mL
MDMA: NEGATIVE ng/mL
Marijuana Metabolite: NEGATIVE ng/mL
Methamphetamine: NEGATIVE ng/mL
Nordiazepam: NEGATIVE ng/mL
Opiates: NEGATIVE ng/mL
Oxazepam: NEGATIVE ng/mL
Oxidant: NEGATIVE ug/mL
Oxycodone: NEGATIVE ng/mL
Temazepam: NEGATIVE ng/mL
pH: 5.2 (ref 4.5–9.0)

## 2018-11-08 ENCOUNTER — Encounter: Payer: Self-pay | Admitting: Family Medicine

## 2018-11-08 ENCOUNTER — Other Ambulatory Visit: Payer: Self-pay | Admitting: Family Medicine

## 2018-11-08 DIAGNOSIS — F419 Anxiety disorder, unspecified: Secondary | ICD-10-CM

## 2018-11-08 DIAGNOSIS — F988 Other specified behavioral and emotional disorders with onset usually occurring in childhood and adolescence: Secondary | ICD-10-CM

## 2018-11-08 NOTE — Telephone Encounter (Signed)
Pt returned call, tried calling office. Confirmed she does need adderal sent to [facility] for 3 month supply  Best contact: [phone]

## 2018-11-08 NOTE — Telephone Encounter (Signed)
Requesting: Xanax Contract: 10/13/2018 UDS: 10/13/2018 Last OV: 10/13/2018 Next OV: 04/19/2019 Last Refill: 10/13/2018, #20--0 RF Database:   Please advise

## 2018-11-09 MED ORDER — ALPRAZOLAM 0.25 MG PO TABS: 0.2500 mg | ORAL_TABLET | Freq: Two times a day (BID) | ORAL | 0 refills | Status: DC | PRN

## 2018-11-09 NOTE — Telephone Encounter (Signed)
I sent 3 sep rx to the pharmacy

## 2018-11-09 NOTE — Telephone Encounter (Signed)
Looks like 3 separate rx were sent in -- post dated  Normally ins will not pay for 90 day of adderall -- if he ins is diff we can change it

## 2018-11-10 MED FILL — ADDERALL XR 30 MG CAP SA: 30 | 30 days supply | Qty: 30 | Fill #0

## 2018-12-07 ENCOUNTER — Other Ambulatory Visit: Payer: Self-pay | Admitting: Family Medicine

## 2018-12-07 DIAGNOSIS — F988 Other specified behavioral and emotional disorders with onset usually occurring in childhood and adolescence: Secondary | ICD-10-CM

## 2018-12-07 DIAGNOSIS — F419 Anxiety disorder, unspecified: Secondary | ICD-10-CM

## 2018-12-07 MED ORDER — AMPHETAMINE-DEXTROAMPHET ER 30 MG PO CP24: 30.0000 mg | ORAL_CAPSULE | ORAL | 0 refills | Status: DC

## 2018-12-07 MED ORDER — ALPRAZOLAM 0.25 MG PO TABS: 0.2500 mg | ORAL_TABLET | Freq: Two times a day (BID) | ORAL | 0 refills | Status: DC | PRN

## 2018-12-07 MED FILL — ALPRAZolam 0.25 MG TABS: 0.25 | 10 days supply | Qty: 20 | Fill #0

## 2018-12-07 NOTE — Telephone Encounter (Signed)
Requesting: Xanax Contract: 10/13/2018 UDS: 10/13/2018, low risk Last OV: 10/13/2018 Next OV: 04/19/2019  Last Refill: 11/09/2018 Database:   Requesting: Adderall XR Contract:  UDS: Last OV: Next OV: Last Refill: 10/13/2018, #30--0 RF Database:   Please advise

## 2018-12-08 MED FILL — ADDERALL XR 30 MG CAP SA: 30 | 90 days supply | Qty: 90 | Fill #0

## 2019-01-05 ENCOUNTER — Other Ambulatory Visit: Payer: Self-pay | Admitting: Family Medicine

## 2019-01-05 DIAGNOSIS — F419 Anxiety disorder, unspecified: Secondary | ICD-10-CM

## 2019-01-05 MED ORDER — ALPRAZOLAM 0.25 MG PO TABS: 0.2500 mg | ORAL_TABLET | Freq: Two times a day (BID) | ORAL | 0 refills | Status: DC | PRN

## 2019-01-05 NOTE — Telephone Encounter (Signed)
Last written: 12/07/18 Last ov: 10/13/18 Next ov:  04/19/19 Contract: 10/13/18 UDS: 10/13/18

## 2019-01-26 ENCOUNTER — Other Ambulatory Visit: Payer: Self-pay | Admitting: Family Medicine

## 2019-01-26 DIAGNOSIS — F419 Anxiety disorder, unspecified: Secondary | ICD-10-CM

## 2019-01-29 MED ORDER — ALPRAZOLAM 0.25 MG PO TABS: 0.2500 mg | ORAL_TABLET | Freq: Two times a day (BID) | ORAL | 0 refills | Status: DC | PRN

## 2019-01-29 NOTE — Telephone Encounter (Signed)
Request for Alprazolam 0.25mg   Last refill: 01/05/2019, #20, 0 refill Last OV: 10/13/18 Next OV:  04/19/19 Contract: 10/13/18 UDS: 10/13/18  Please advise.

## 2019-02-13 IMAGING — DX DG KNEE COMPLETE 4+V*R*
4 series · 4 of 4 positions shown · non-contrast
Comparison: None.

CLINICAL DATA: 37-year-old female with progressed chronic right
knee pain and swelling over the past month. No known injury.

EXAM:
RIGHT KNEE - COMPLETE 4+ VIEW

[knee ap]
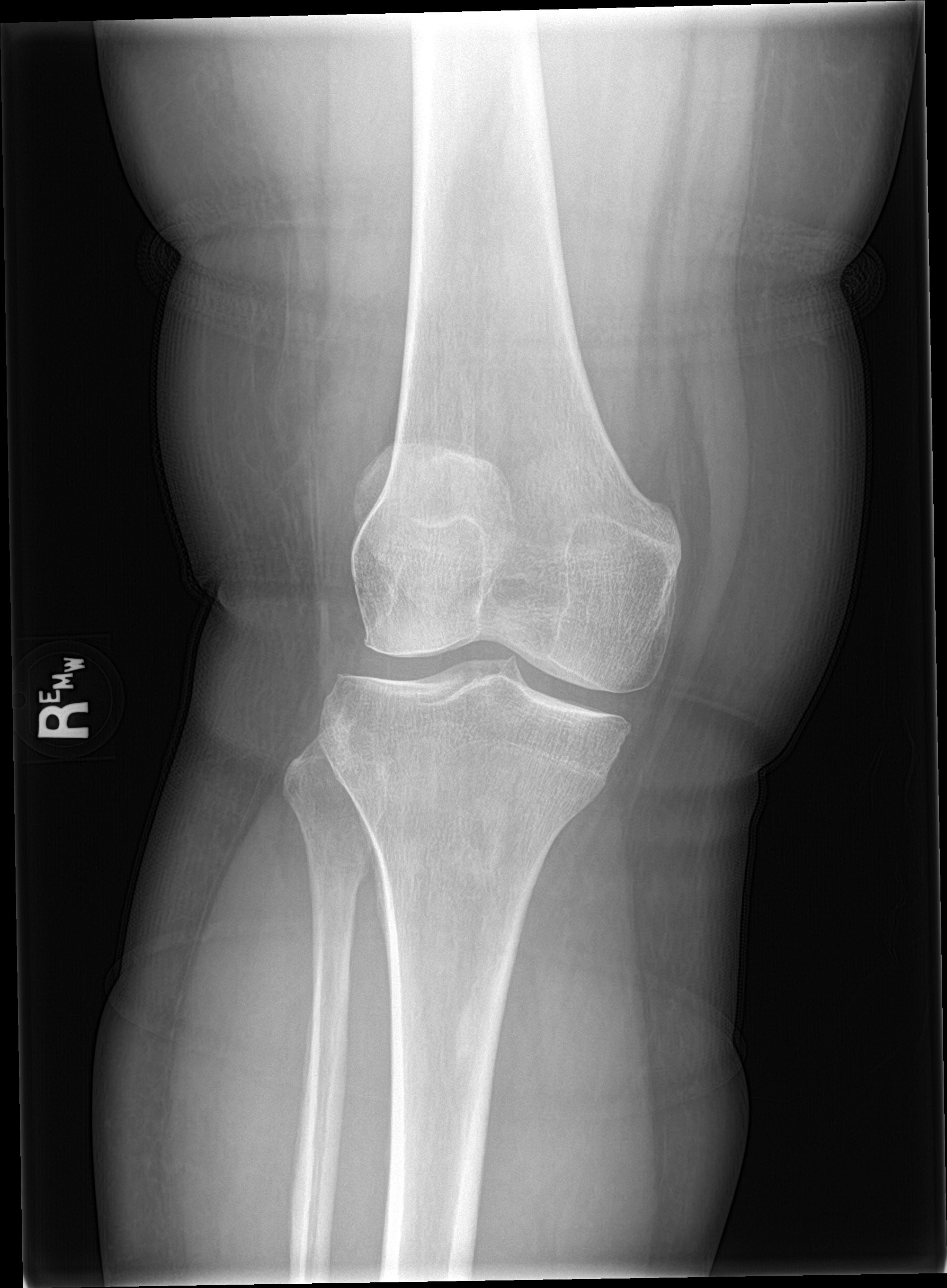

[knee lat]
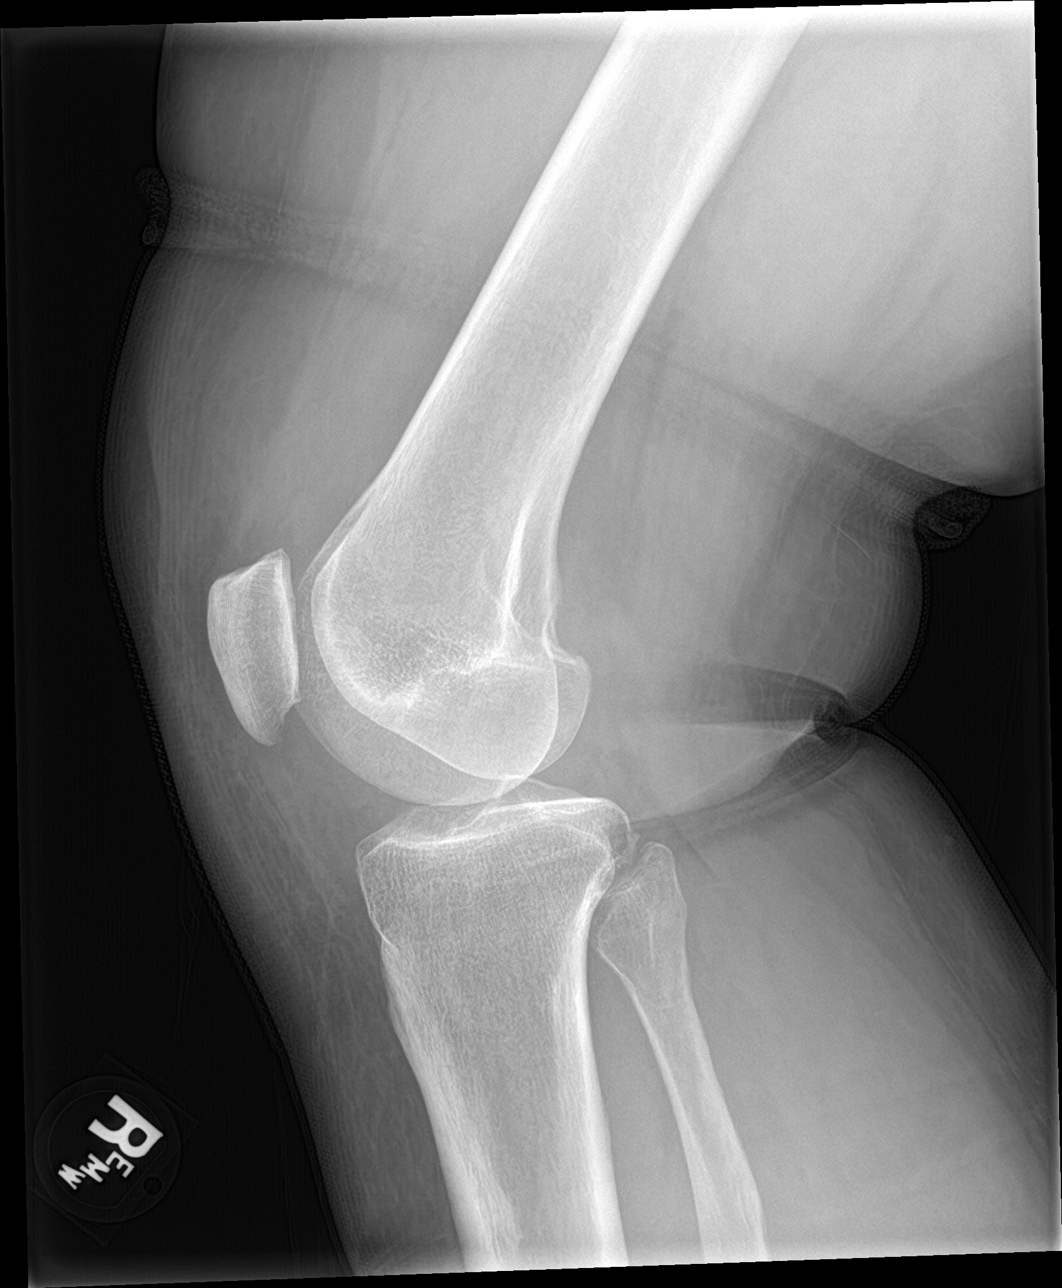

[knee obl (1 of 2)]
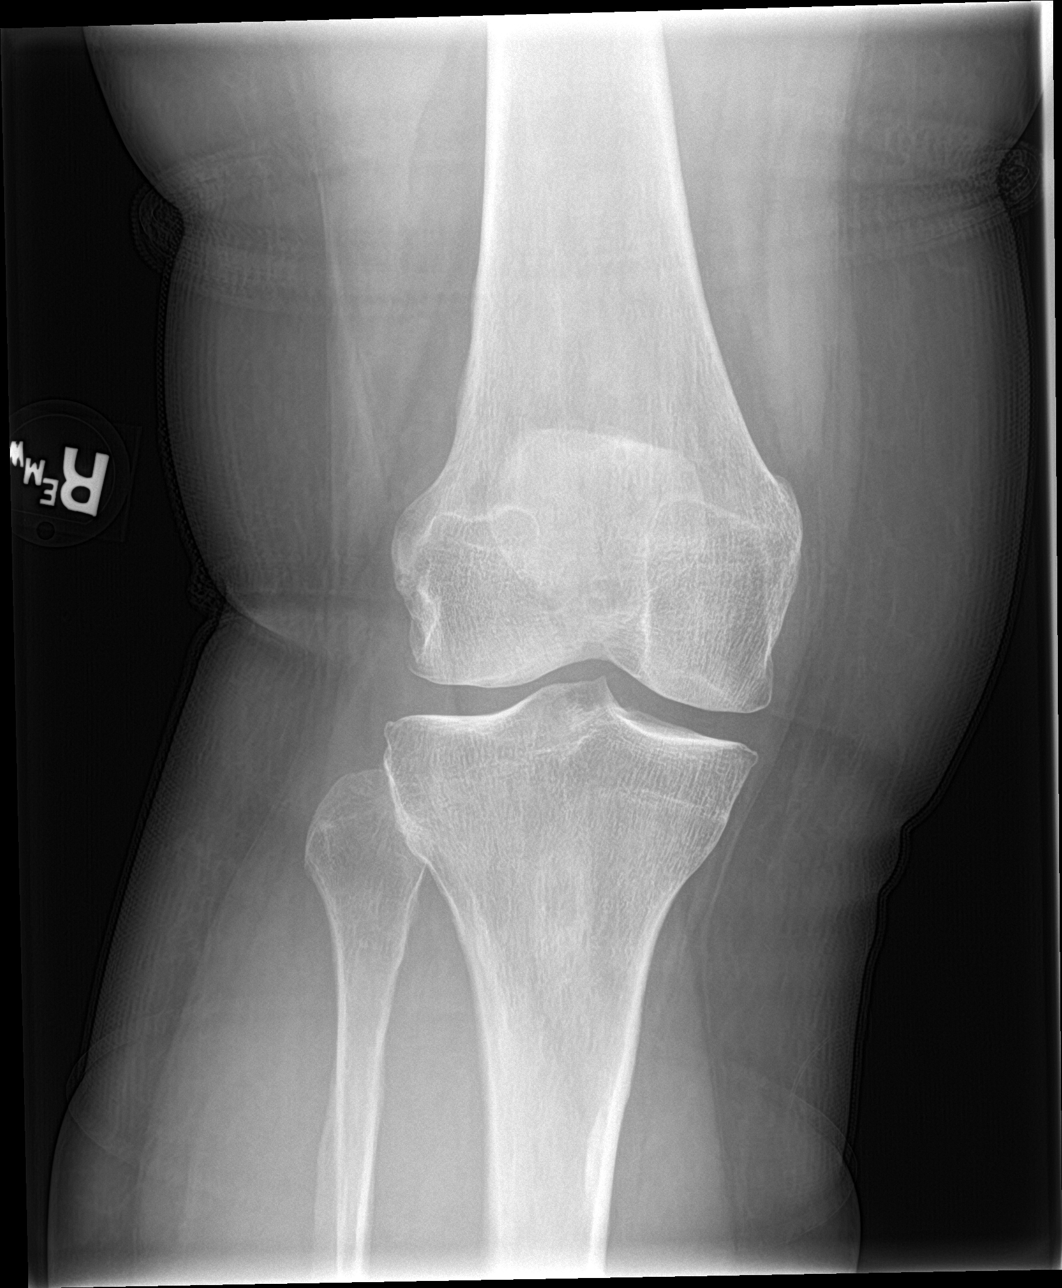

[knee obl (2 of 2)]
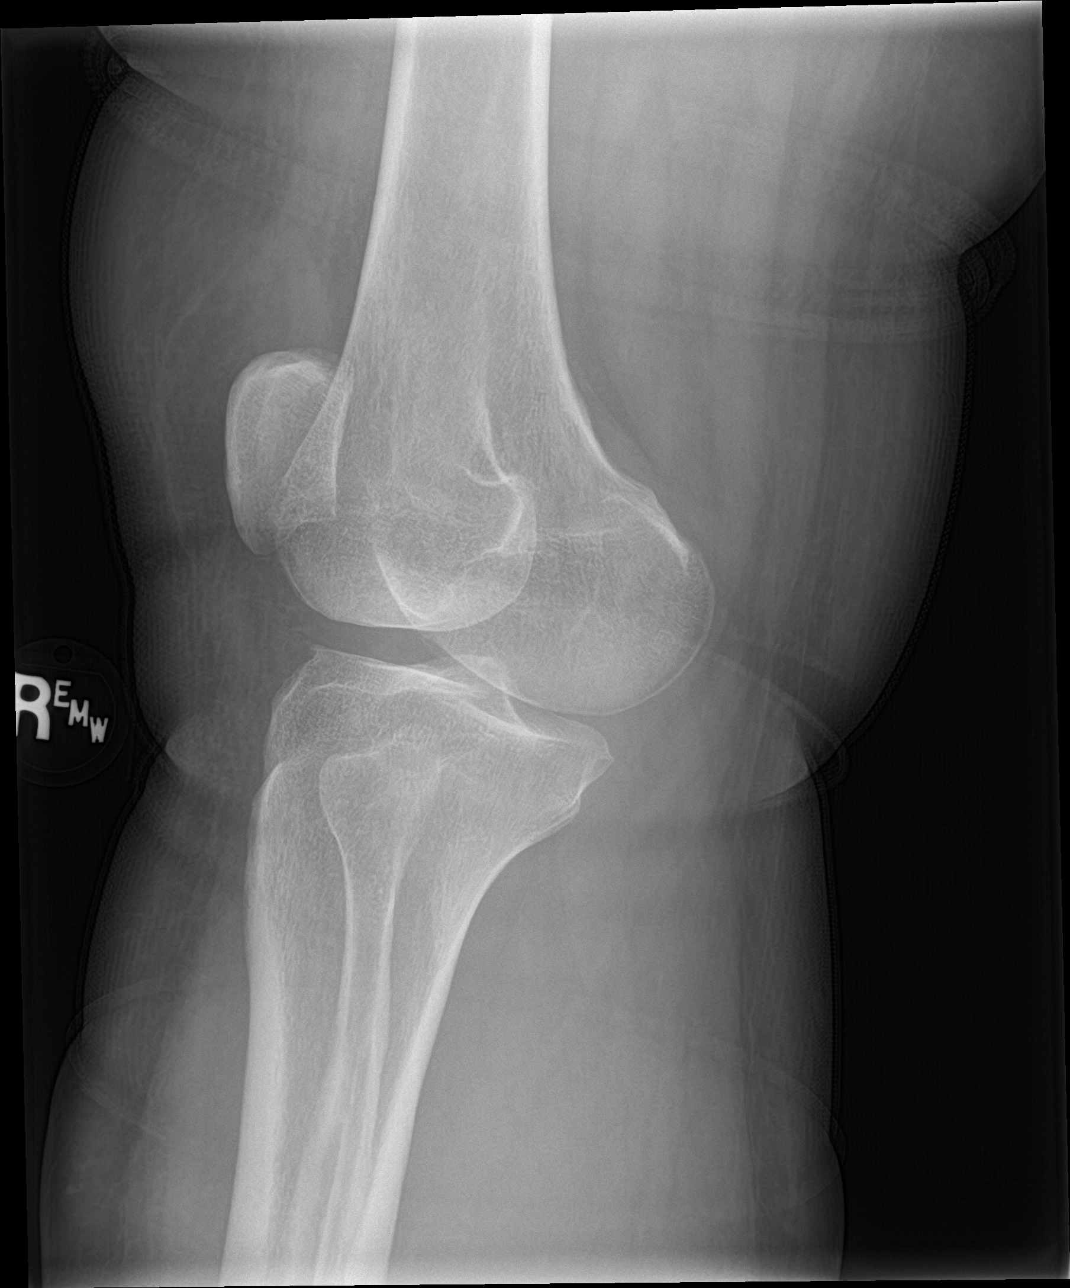

[4 of 4 positions shown; findings below may reference images not displayed]

FINDINGS: Bone mineralization is within normal limits. Preserved joint spaces
and alignment. Small suprapatellar joint effusion suspected. Patella
intact. Minor tricompartmental degenerative spurring. No acute
osseous abnormality identified.
IMPRESSION: Small joint effusion with mild degenerative osseous changes at the
right knee. No acute osseous abnormality identified.

## 2019-03-01 ENCOUNTER — Other Ambulatory Visit: Payer: Self-pay | Admitting: Family Medicine

## 2019-03-01 DIAGNOSIS — F988 Other specified behavioral and emotional disorders with onset usually occurring in childhood and adolescence: Secondary | ICD-10-CM

## 2019-03-01 DIAGNOSIS — F419 Anxiety disorder, unspecified: Secondary | ICD-10-CM

## 2019-03-05 ENCOUNTER — Other Ambulatory Visit: Payer: Self-pay | Admitting: Family Medicine

## 2019-03-05 ENCOUNTER — Encounter: Payer: Self-pay | Admitting: Family Medicine

## 2019-03-05 DIAGNOSIS — F419 Anxiety disorder, unspecified: Secondary | ICD-10-CM

## 2019-03-05 DIAGNOSIS — F988 Other specified behavioral and emotional disorders with onset usually occurring in childhood and adolescence: Secondary | ICD-10-CM

## 2019-03-05 NOTE — Telephone Encounter (Signed)
Requesting: Xanax Contract: 10/13/2018 UDS: 10/13/2018 Last OV: 10/13/2018 Next OV: 04/19/2019 Last Refill: 01/29/2019, #20--0 RF Database:   Requesting: Adderall XR Contract:  UDS: Last OV: Next OV: Last Refill: 12/07/2018, #90--0 RF Database:   Please advise

## 2019-03-06 ENCOUNTER — Other Ambulatory Visit: Payer: Self-pay | Admitting: Family Medicine

## 2019-03-06 DIAGNOSIS — F419 Anxiety disorder, unspecified: Secondary | ICD-10-CM

## 2019-03-06 DIAGNOSIS — F988 Other specified behavioral and emotional disorders with onset usually occurring in childhood and adolescence: Secondary | ICD-10-CM

## 2019-03-06 MED ORDER — AMPHETAMINE-DEXTROAMPHET ER 30 MG PO CP24: 30.0000 mg | ORAL_CAPSULE | ORAL | 0 refills | Status: DC

## 2019-03-06 MED ORDER — ALPRAZOLAM 0.25 MG PO TABS: 0.2500 mg | ORAL_TABLET | Freq: Two times a day (BID) | ORAL | 0 refills | Status: DC | PRN

## 2019-03-06 MED FILL — ALPRAZolam 0.25 MG TABS: 0.25 | 10 days supply | Qty: 20 | Fill #0

## 2019-03-06 MED FILL — ADDERALL XR 30 MG CAP SA: 30 | 30 days supply | Qty: 30 | Fill #0

## 2019-03-06 NOTE — Telephone Encounter (Signed)
Requesting:Alprazolam Contract:08/31/2017 UDS:10/13/2018 Last Visit:10/13/2018 Next Visit:04/19/2019 Last Refill:01/29/2019  Please Advise

## 2019-04-03 ENCOUNTER — Other Ambulatory Visit: Payer: Self-pay | Admitting: Family Medicine

## 2019-04-03 DIAGNOSIS — F988 Other specified behavioral and emotional disorders with onset usually occurring in childhood and adolescence: Secondary | ICD-10-CM

## 2019-04-03 DIAGNOSIS — F419 Anxiety disorder, unspecified: Secondary | ICD-10-CM

## 2019-04-04 ENCOUNTER — Telehealth: Payer: Self-pay

## 2019-04-04 MED ORDER — ALPRAZOLAM 0.25 MG PO TABS: 0.2500 mg | ORAL_TABLET | Freq: Two times a day (BID) | ORAL | 0 refills | Status: DC | PRN

## 2019-04-04 MED ORDER — AMPHETAMINE-DEXTROAMPHET ER 30 MG PO CP24: 30.0000 mg | ORAL_CAPSULE | ORAL | 0 refills | Status: DC

## 2019-04-04 NOTE — Telephone Encounter (Signed)
Marisa Humphrey from [facility] called in to see if Dr. Etter Sjogren could send over a prescription for  (ADDERALL Extend release XR 30 mg capsule. 90 supply,    Per the pharmacy tech Dr. Etter Sjogren put on the 1st prescription to no refill this prescription until March. The patient is out of medication now and they would like to get an over ride request to fill it.   Please give Marisa Humphrey a call at [phone]    Thanks,

## 2019-04-04 NOTE — Telephone Encounter (Signed)
Attempted to call pharmacy. No answer

## 2019-04-04 NOTE — Telephone Encounter (Signed)
Requesting:Alprazolam and Adderall  Contract:10/23/2018 UDS:10/13/2018 Last Visit:10/13/2018 Next Visit:04/19/2019 Last Refill:03/06/2019  Please Advise

## 2019-04-06 ENCOUNTER — Telehealth: Payer: Self-pay | Admitting: Family Medicine

## 2019-04-06 NOTE — Telephone Encounter (Signed)
Verbal confirmed 

## 2019-04-06 NOTE — Telephone Encounter (Signed)
amphetamine-dextroamphetamine (ADDERALL XR) 30 MG 24 hr capsule  was filled on  04/04/19 with  A Note to Pharmacy: Do not fill until March 2021.   [facility] at [place], [place] - [address], [place] [address]  Phone:  [phone] Fax:  [phone]   Calling needing a verbal okay to fill script patient should be due for refill. Please call Doren Custard back at the pharmacy for a verbal approval to fill.

## 2019-04-19 ENCOUNTER — Ambulatory Visit (INDEPENDENT_AMBULATORY_CARE_PROVIDER_SITE_OTHER): Payer: No Typology Code available for payment source | Admitting: Family Medicine

## 2019-04-19 ENCOUNTER — Encounter: Payer: Self-pay | Admitting: Family Medicine

## 2019-04-19 DIAGNOSIS — F418 Other specified anxiety disorders: Secondary | ICD-10-CM

## 2019-04-19 DIAGNOSIS — F988 Other specified behavioral and emotional disorders with onset usually occurring in childhood and adolescence: Secondary | ICD-10-CM | POA: Diagnosis not present

## 2019-04-19 NOTE — Patient Instructions (Signed)

## 2019-04-19 NOTE — Progress Notes (Signed)
Virtual Visit via Video Note  I connected with Marisa Humphrey on 04/19/19 at 10:20 AM EST by a video enabled telemedicine application and verified that I am speaking with the correct person using two identifiers.  Location: Patient: home alone Provider: home   I discussed the limitations of evaluation and management by telemedicine and the availability of in person appointments. The patient expressed understanding and agreed to proceed.  History of Present Illness: Pt is home needing f/u for add and med   uds and contract are utd Database reviewed    Past Medical History:  Diagnosis Date  . Anxiety   . Depression   . External hemorrhoids without mention of complication   . GERD (gastroesophageal reflux disease)   . History of condyloma acuminatum   . Hx of varicella   . Postpartum care following vaginal delivery (7/26) 09/23/2013   Current Outpatient Medications on File Prior to Visit  Medication Sig Dispense Refill  . ALPRAZolam (XANAX) 0.25 MG tablet Take 1 tablet (0.25 mg total) by mouth 2 (two) times daily as needed for anxiety. 20 tablet 0  . amphetamine-dextroamphetamine (ADDERALL XR) 30 MG 24 hr capsule Take 1 capsule (30 mg total) by mouth every morning. 30 capsule 0  . amphetamine-dextroamphetamine (ADDERALL XR) 30 MG 24 hr capsule Take 1 capsule (30 mg total) by mouth every morning. 30 capsule 0  . amphetamine-dextroamphetamine (ADDERALL XR) 30 MG 24 hr capsule Take 1 capsule (30 mg total) by mouth every morning. 90 capsule 0  . buPROPion (WELLBUTRIN XL) 300 MG 24 hr tablet Take 1 tablet (300 mg total) by mouth daily. 90 tablet 3  . sertraline (ZOLOFT) 100 MG tablet Take 1 tablet (100 mg total) by mouth daily. 90 tablet 3   No current facility-administered medications on file prior to visit.   Allergies  Allergen Reactions  . Penicillins Rash    Amoxil has been taken w/o reaction   Social History   Socioeconomic History  . Marital status: Married     Spouse name: Not on file  . Number of children: 1  . Years of education: Not on file  . Highest education level: Not on file  Occupational History  . Occupation: Optician, dispensing: [employer]    Employer: [employer]  Tobacco Use  . Smoking status: Former Smoker    Packs/day: 0.50    Years: 16.00    Pack years: 8.00    Types: Cigarettes  . Smokeless tobacco: Never Used  Substance and Sexual Activity  . Alcohol use: No  . Drug use: No  . Sexual activity: Yes    Partners: Male  Other Topics Concern  . Not on file  Social History Narrative   Exercising --no   Social Determinants of Health   Financial Resource Strain:   . Difficulty of Paying Living Expenses: Not on file  Food Insecurity:   . Worried About Charity fundraiser in the Last Year: Not on file  . Ran Out of Food in the Last Year: Not on file  Transportation Needs:   . Lack of Transportation (Medical): Not on file  . Lack of Transportation (Non-Medical): Not on file  Physical Activity:   . Days of Exercise per Week: Not on file  . Minutes of Exercise per Session: Not on file  Stress:   . Feeling of Stress : Not on file  Social Connections:   . Frequency of Communication with Friends and Family: Not  on file  . Frequency of Social Gatherings with Friends and Family: Not on file  . Attends Religious Services: Not on file  . Active Member of Clubs or Organizations: Not on file  . Attends Archivist Meetings: Not on file  . Marital Status: Not on file  Intimate Partner Violence:   . Fear of Current or Ex-Partner: Not on file  . Emotionally Abused: Not on file  . Physically Abused: Not on file  . Sexually Abused: Not on file    Observations/Objective: There were no vitals filed for this visit. Pt is in nad   Assessment and Plan: 1. Attention deficit 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 Stable con't meds Pt having no trouble with medication   2. Depression with  anxiety Stable con't zoloft, wellbutrin and prn xanax Pt with no complaints Stable   Follow Up Instructions:    I discussed the assessment and treatment plan with the patient. The patient was provided an opportunity to ask questions and all were answered. The patient agreed with the plan and demonstrated an understanding of the instructions.   The patient was advised to call back or seek an in-person evaluation if the symptoms worsen or if the condition fails to improve as anticipated.  I provided 30 minutes of non-face-to-face time during this encounter.   Ann Held, DO

## 2019-04-19 NOTE — Assessment & Plan Note (Signed)
Stable con't meds Pt having no trouble with medication

## 2019-04-19 NOTE — Assessment & Plan Note (Signed)
Stable con't zoloft, wellbutrin and prn xanax Pt with no complaints

## 2019-05-03 ENCOUNTER — Other Ambulatory Visit: Payer: Self-pay | Admitting: Family Medicine

## 2019-05-03 DIAGNOSIS — F419 Anxiety disorder, unspecified: Secondary | ICD-10-CM

## 2019-05-03 MED ORDER — ALPRAZOLAM 0.25 MG PO TABS: 0.2500 mg | ORAL_TABLET | Freq: Two times a day (BID) | ORAL | 0 refills | Status: DC | PRN

## 2019-05-03 NOTE — Telephone Encounter (Signed)
Requesting: xanax Contract:10/23/18 UDS:10/13/18 Last Visit:04/19/19 Next Visit:10/16/19 Last Refill:04/04/19  Please Advise

## 2019-05-28 ENCOUNTER — Other Ambulatory Visit: Payer: Self-pay

## 2019-05-28 ENCOUNTER — Telehealth: Payer: Self-pay | Admitting: Family Medicine

## 2019-05-28 ENCOUNTER — Ambulatory Visit (INDEPENDENT_AMBULATORY_CARE_PROVIDER_SITE_OTHER): Payer: No Typology Code available for payment source | Admitting: Family Medicine

## 2019-05-28 ENCOUNTER — Encounter: Payer: Self-pay | Admitting: Family Medicine

## 2019-05-28 ENCOUNTER — Other Ambulatory Visit: Payer: Self-pay | Admitting: Family Medicine

## 2019-05-28 ENCOUNTER — Ambulatory Visit
Admission: RE | Admit: 2019-05-28 | Discharge: 2019-05-28 | Disposition: A | Discharge status: Home / Self Care | Payer: No Typology Code available for payment source | Source: Ambulatory Visit | Attending: Family Medicine | Admitting: Family Medicine

## 2019-05-28 VITALS — BP 148/90 | HR 86 | Temp 97.3°F | Ht 67.0 in | Wt 266.0 lb

## 2019-05-28 DIAGNOSIS — S6992XA Unspecified injury of left wrist, hand and finger(s), initial encounter: Secondary | ICD-10-CM

## 2019-05-28 DIAGNOSIS — M84445A Pathological fracture, left finger(s), initial encounter for fracture: Secondary | ICD-10-CM

## 2019-05-28 MED ORDER — HYDROCODONE-ACETAMINOPHEN 5-325 MG PO TABS: 1.0000 | ORAL_TABLET | Freq: Four times a day (QID) | ORAL | 0 refills | Status: DC | PRN

## 2019-05-28 NOTE — Telephone Encounter (Signed)
Noted  

## 2019-05-28 NOTE — Telephone Encounter (Signed)
Pt called about results from Xrays today. I let her know Lowne hasnt had a chance to review yet. Once she reviews it she will be able to contact her back

## 2019-05-28 NOTE — Patient Instructions (Addendum)
Ice/cold pack over area for 10-15 min twice daily.  Ibuprofen 800 mg every 8 hours as needed for pain.  Do not drink alcohol, do any illicit/street drugs, drive or do anything that requires alertness while on this medicine.   We will be in touch regarding your imaging.    How to Buddy Tape Buddy taping refers to taping an injured finger or toe to an uninjured finger or toe that is next to it. This protects the injured finger or toe and keeps it from moving while the injury heals. You may buddy tape a finger or toe if you have a minor sprain. Your health care provider may buddy tape your finger or toe if you have a sprain, dislocation, or fracture. You may be told to replace your buddy taping as needed. What are the risks? Generally, buddy taping is safe. However, problems may occur, such as:  Skin injury or infection.  Reduced blood flow to the finger or toe.  Skin reaction to the tape. Do not buddy tape your toe if you have diabetes. Do not buddy tape if you know that you have an allergy to adhesives or surgical tape. Supplies needed:  Gauze pad, cotton, or cloth.  Tape. This may be called first-aid tape, surgical tape, or medical tape. How to buddy tape Before buddy taping Lessen any pain and swelling with rest, icing, and elevation:  Avoid activities that cause pain.  If directed, put ice on the injured area: ? Put ice in a plastic bag. ? Place a towel between your skin and the bag. ? Leave the ice on for 20 minutes, 2-3 times a day.  Raise (elevate) your hand or foot above the level of your heart while you are sitting or lying down.  Buddy taping procedure   Clean and dry your finger or toe as told by your health care provider.  Place a gauze pad or a piece of cloth or cotton between your injured finger or toe and the uninjured finger or toe.  Use tape to wrap around both fingers or toes so your injured finger or toe is secured to the uninjured finger or toe. ? The  tape should be snug, but not tight. ? Make sure the ends of the piece of tape overlap. ? Avoid placing tape directly over the joint.  Change the tape and the padding as told by your health care provider. Remove and replace the tape or padding if it becomes loose, worn, dirty, or wet. After buddy taping  Watch the buddy-taped area and always remove buddy taping if your: ? Pain gets worse. ? Fingers or toes turn pale or blue. ? Skin becomes irritated. Follow these instructions at home:  Take over-the-counter and prescription medicines only as told by your health care provider.  Return to your normal activities as told by your health care provider. Ask your health care provider what activities are safe for you. Contact a health care provider if:  You have pain, swelling, or bruising that lasts longer than 3 days.  You have a fever.  Your skin is red, cracked, or irritated. Get help right away if:  The injured area becomes cold, numb, or pale.  You have severe pain, swelling, bruising, or loss of movement in your finger or toe.  Your finger or toe changes shape (deformity). Summary  Buddy taping refers to taping an injured finger or toe to an uninjured finger or toe that is next to it.  You may buddy tape  a finger or toe if you have a minor sprain.  Take over-the-counter and prescription medicines only as told by your health care provider. This information is not intended to replace advice given to you by your health care provider. Make sure you discuss any questions you have with your health care provider. Document Revised: 06/08/2018 Document Reviewed: 02/27/2018 Elsevier Patient Education  [employer].

## 2019-05-28 NOTE — Progress Notes (Signed)
Musculoskeletal Exam  Patient: Marisa Humphrey DOB: 11/13/1980  DOS: 05/28/2019  SUBJECTIVE:  Chief Complaint:   Chief Complaint  Patient presents with  . Hand Pain    left ring finger injury     Marisa Humphrey is a 39 y.o.  female for evaluation and treatment of left ring finger pain.   Onset:  1 day ago. No inj or change in activity.  Location: Left ring finger over the proximal phalanx, MCP, and PIP  Character:  sharp  Progression of issue:  is unchanged Associated symptoms: Swelling, decreased range of motion; denies bruising, excessive warmth, drainage Treatment: to date has been OTC NSAIDS and acetaminophen.   Neurovascular symptoms: no  Past Medical History:  Diagnosis Date  . Anxiety   . Depression   . External hemorrhoids without mention of complication   . GERD (gastroesophageal reflux disease)   . History of condyloma acuminatum   . Hx of varicella   . Postpartum care following vaginal delivery (7/26) 09/23/2013    Objective: VITAL SIGNS: BP (!) 148/90 (BP Location: Left Arm, Patient Position: Sitting, Cuff Size: Large)   Pulse 86   Temp (!) 97.3 F (36.3 C) (Temporal)   Ht 5\' 7"  (1.702 m)   Wt 266 lb (120.7 kg)   SpO2 98%   BMI 41.66 kg/m  Constitutional: Well formed, well developed. No acute distress. Cardiovascular: Brisk cap refill Thorax & Lungs: No accessory muscle use Musculoskeletal: Left fourth digit.   Normal active range of motion: no.   Normal passive range of motion: no Tenderness to palpation: Yes; over the MCP, proximal phalanx, and PIP; pressure applied to the distal end of the digit also elicits proximal pain Deformity: no Ecchymosis: no + Gross edema Neurologic: Normal sensory function.  Psychiatric: Normal mood. Age appropriate judgment and insight. Alert & oriented x 3.    Assessment:  Injury of finger of left hand, initial encounter - Plan: HYDROcodone-acetaminophen (NORCO/VICODIN) 5-325 MG tablet,  DG Finger Ring Left  Plan: Scheduled ibuprofen for the next 48 hours, hydrocodone for breakthrough pain, 500 mg or less of Tylenol as needed every 6 hours.  Ice.  Buddy taping.  X-ray.  Await results.  Hopefully just a jam. F/u as needed. The patient voiced understanding and agreement to the plan.   Howard, DO 05/28/19  12:04 PM

## 2019-05-29 ENCOUNTER — Telehealth: Payer: Self-pay | Admitting: Family Medicine

## 2019-05-29 NOTE — Telephone Encounter (Signed)
Pt called again regarding her referral,,, She states she is lots of pain and needs this done ASAP. Please thanks

## 2019-05-29 NOTE — Telephone Encounter (Signed)
  Please put this referral in as urgent . Patient would like to go to [facility] on    [address], [place], [place], [place] [address]  Phone: [phone]  Phone: [phone]  Fax: [phone]  Patient wants to see :  Satira Anis. Blanchie Dessert, MD Hand & Wrist, Elbow & Arm Hand Surgeon: Upper Extremity with focus on the Hand, Wrist, & Elbow

## 2019-05-29 NOTE — Telephone Encounter (Signed)
I have sent a staff message to referral coordinator already as have spoken to the patient already regarding sending to this location.

## 2019-05-30 ENCOUNTER — Other Ambulatory Visit: Payer: Self-pay | Admitting: Family Medicine

## 2019-05-30 DIAGNOSIS — M84445A Pathological fracture, left finger(s), initial encounter for fracture: Secondary | ICD-10-CM

## 2019-05-30 NOTE — Progress Notes (Signed)
a 

## 2019-05-30 NOTE — Addendum Note (Signed)
Addended by: Ames Coupe on: 05/30/2019 03:10 PM   Modules accepted: Orders

## 2019-05-30 NOTE — Telephone Encounter (Signed)
Stat referral ordered by Dr. Nani Ravens for patient to see Dr. Leanora Cover. Appointment today at [place].  Per patient, referred provider is in network with [facility].

## 2019-05-30 NOTE — Telephone Encounter (Signed)
I was out of the office yesterday afternoon and the Practice Admin and Supervisor were aware. The original referral did not have a practice/location specified and was sent to [facility] on 3/30. On 3/30 at 2:47pm a message was sent to me by the CMA, that was not seen until this morning requesting the referral be sent to Dr. Amedeo Plenty at [facility]. [place] have additional messages this morning at 8:18am and 11:09am. A new referral was placed at 8:31am and uploaded to [facility] this morning for Dr. Amedeo Plenty at [facility] at 9:49am.

## 2019-05-30 NOTE — Telephone Encounter (Signed)
Called the patient this morning to inquire if contacted from [facility] yet? And how she is doing/ She stated still no word and would followup with them shortly to see if referral done/then she could make appt. Dr. Nani Ravens did request to put in Urgent referral to ortho Dr. Stacie Acres now done. Have resent referrral coordinator another message this am to see if referral was sent yesterday per note/message from 05/29/19 regarding the need to [facility].

## 2019-05-30 NOTE — Telephone Encounter (Signed)
Received via a Teams message:   [2:47 PM] [place], [facility])     Ileene Musa, Can you get my referral sent to Leanora Cover?  Dr Windy Canny, the surgeon  at my office reached out to him personally and he will see me today.

## 2019-05-30 NOTE — Telephone Encounter (Signed)
Received an email through [place] yesterday from this patient at 5:05 PM. Called her today for follow up and discovered several areas of breakdown in handling her referral.  Referral was originally put in on Monday evening (9pm). It was not marked Urgent and did not have a practice/location specified because the pt wanted to call back with what hand specialist she wanted to see.   When the patient called Tuesday morning at 8:08 AM to specify which provider she wanted, the message was routed to a CMA instead of the referral coordinator.   The referral coordinator covering for our office had already (efficiently) routed the referral to [facility]. [facility] contacted the patient and discovered she had requested [facility] and routed the referral back to [place].   Patient called our office again yesterday morning at roughly 11 AM to see where her referral was in process and to alert staff that she had additional swelling and pain. That phone call was not documented within the patient's chart.   Patient says she called again at 2:54 PM to check the status of the referral and was told that "multiple calls regarding the same issue would not be documented in her chart."  Patient states she is currently in great pain feels incredibly vulnerable. I expressed my most sincere apology. I advise the patient that we would educate staff on documenting within the chart for each phone call and that we would try and expedite this referral for her. She was told by Dr. Vanetta Shawl office that the first available new patient appointment is not until the end of May currently. Patient was appreciative of the call.   I advised her to contact us if her condition worsened and advised that I would see if Dr Nani Ravens thought a peer to peer with [facility] would be beneficial since her referral was delayed on our part.

## 2019-05-30 NOTE — Telephone Encounter (Signed)
Caller: Kerstin Haseley [facility] Call Back # [phone]   Patient states that [facility] has not received her referral. Please send a referral to [facility] urgent.

## 2019-05-30 NOTE — Telephone Encounter (Signed)
I have faxed via [facility] to Dr. Fredna Dow at [facility]. I called and spoke with the office and they will call me back if not received. They called [facility] and have auth # for the visit today per [facility].

## 2019-05-31 ENCOUNTER — Encounter (HOSPITAL_BASED_OUTPATIENT_CLINIC_OR_DEPARTMENT_OTHER): Payer: Self-pay | Admitting: Orthopedic Surgery

## 2019-05-31 ENCOUNTER — Other Ambulatory Visit: Payer: Self-pay | Admitting: Orthopedic Surgery

## 2019-05-31 ENCOUNTER — Other Ambulatory Visit: Payer: Self-pay

## 2019-06-02 ENCOUNTER — Other Ambulatory Visit (HOSPITAL_COMMUNITY)
Admission: RE | Admit: 2019-06-02 | Discharge: 2019-06-02 | Disposition: A | Discharge status: Home / Self Care | Payer: No Typology Code available for payment source | Source: Ambulatory Visit | Attending: Orthopedic Surgery | Admitting: Orthopedic Surgery

## 2019-06-02 DIAGNOSIS — Z01812 Encounter for preprocedural laboratory examination: Secondary | ICD-10-CM | POA: Insufficient documentation

## 2019-06-02 DIAGNOSIS — Z20822 Contact with and (suspected) exposure to covid-19: Secondary | ICD-10-CM | POA: Insufficient documentation

## 2019-06-02 LAB — SARS CORONAVIRUS 2 (TAT 6-24 HRS): SARS Coronavirus 2: NEGATIVE

## 2019-06-05 ENCOUNTER — Ambulatory Visit (HOSPITAL_BASED_OUTPATIENT_CLINIC_OR_DEPARTMENT_OTHER): Payer: No Typology Code available for payment source | Admitting: Certified Registered Nurse Anesthetist

## 2019-06-05 ENCOUNTER — Encounter (HOSPITAL_BASED_OUTPATIENT_CLINIC_OR_DEPARTMENT_OTHER)
Admission: RE | Disposition: A | Discharge status: Home / Self Care | Payer: Self-pay | Source: Home / Self Care | Attending: Orthopedic Surgery

## 2019-06-05 ENCOUNTER — Encounter (HOSPITAL_BASED_OUTPATIENT_CLINIC_OR_DEPARTMENT_OTHER): Payer: Self-pay | Admitting: Orthopedic Surgery

## 2019-06-05 ENCOUNTER — Ambulatory Visit (HOSPITAL_BASED_OUTPATIENT_CLINIC_OR_DEPARTMENT_OTHER)
Admission: RE | Admit: 2019-06-05 | Discharge: 2019-06-05 | Disposition: A | Discharge status: Home / Self Care | Payer: No Typology Code available for payment source | Attending: Orthopedic Surgery | Admitting: Orthopedic Surgery

## 2019-06-05 DIAGNOSIS — D1612 Benign neoplasm of short bones of left upper limb: Secondary | ICD-10-CM | POA: Insufficient documentation

## 2019-06-05 DIAGNOSIS — F988 Other specified behavioral and emotional disorders with onset usually occurring in childhood and adolescence: Secondary | ICD-10-CM | POA: Insufficient documentation

## 2019-06-05 DIAGNOSIS — F329 Major depressive disorder, single episode, unspecified: Secondary | ICD-10-CM | POA: Insufficient documentation

## 2019-06-05 DIAGNOSIS — S62615A Displaced fracture of proximal phalanx of left ring finger, initial encounter for closed fracture: Secondary | ICD-10-CM | POA: Insufficient documentation

## 2019-06-05 DIAGNOSIS — Z79899 Other long term (current) drug therapy: Secondary | ICD-10-CM | POA: Diagnosis not present

## 2019-06-05 DIAGNOSIS — Z880 Allergy status to penicillin: Secondary | ICD-10-CM | POA: Insufficient documentation

## 2019-06-05 DIAGNOSIS — F419 Anxiety disorder, unspecified: Secondary | ICD-10-CM | POA: Insufficient documentation

## 2019-06-05 DIAGNOSIS — Z87891 Personal history of nicotine dependence: Secondary | ICD-10-CM | POA: Diagnosis not present

## 2019-06-05 HISTORY — DX: Other specified behavioral and emotional disorders with onset usually occurring in childhood and adolescence: F98.8

## 2019-06-05 HISTORY — PX: EXCISION BONE CYST: SHX6616

## 2019-06-05 HISTORY — PX: OPEN REDUCTION INTERNAL FIXATION (ORIF) FINGER WITH RADIAL BONE GRAFT: SHX5666

## 2019-06-05 LAB — POCT PREGNANCY, URINE: Preg Test, Ur: NEGATIVE

## 2019-06-05 SURGERY — OPEN REDUCTION INTERNAL FIXATION (ORIF) FINGER WITH RADIAL BONE GRAFT
Anesthesia: Monitor Anesthesia Care | Site: Finger | Laterality: Left

## 2019-06-05 MED ORDER — LIDOCAINE 2% (20 MG/ML) 5 ML SYRINGE
INTRAMUSCULAR | Status: DC | PRN
  Administered 2019-06-05: 50 mg via INTRAVENOUS

## 2019-06-05 MED ORDER — FENTANYL CITRATE (PF) 100 MCG/2ML IJ SOLN
50.0000 ug | INTRAMUSCULAR | Status: DC | PRN
  Administered 2019-06-05: 100 ug via INTRAVENOUS

## 2019-06-05 MED ORDER — MEPERIDINE HCL 25 MG/ML IJ SOLN: 6.2500 mg | INTRAMUSCULAR | Status: DC | PRN

## 2019-06-05 MED ORDER — ONDANSETRON HCL 4 MG/2ML IJ SOLN
INTRAMUSCULAR | Status: DC | PRN
  Administered 2019-06-05: 4 mg via INTRAVENOUS

## 2019-06-05 MED ORDER — ONDANSETRON HCL 4 MG/2ML IJ SOLN
INTRAMUSCULAR | Status: AC
  Filled 2019-06-05: qty 2

## 2019-06-05 MED ORDER — OXYCODONE HCL 5 MG PO TABS: 5.0000 mg | ORAL_TABLET | Freq: Once | ORAL | Status: DC | PRN

## 2019-06-05 MED ORDER — ACETAMINOPHEN 500 MG PO TABS
1000.0000 mg | ORAL_TABLET | Freq: Once | ORAL | Status: AC
  Administered 2019-06-05: 1000 mg via ORAL

## 2019-06-05 MED ORDER — VANCOMYCIN HCL IN DEXTROSE 1-5 GM/200ML-% IV SOLN
1000.0000 mg | INTRAVENOUS | Status: AC
  Administered 2019-06-05: 1000 mg via INTRAVENOUS

## 2019-06-05 MED ORDER — PROPOFOL 500 MG/50ML IV EMUL
INTRAVENOUS | Status: AC
  Filled 2019-06-05: qty 50

## 2019-06-05 MED ORDER — MIDAZOLAM HCL 2 MG/2ML IJ SOLN
1.0000 mg | INTRAMUSCULAR | Status: DC | PRN
  Administered 2019-06-05: 2 mg via INTRAVENOUS

## 2019-06-05 MED ORDER — LACTATED RINGERS IV SOLN: INTRAVENOUS | Status: DC

## 2019-06-05 MED ORDER — KETOROLAC TROMETHAMINE 30 MG/ML IJ SOLN: 30.0000 mg | Freq: Once | INTRAMUSCULAR | Status: DC | PRN

## 2019-06-05 MED ORDER — OXYCODONE HCL 5 MG/5ML PO SOLN: 5.0000 mg | Freq: Once | ORAL | Status: DC | PRN

## 2019-06-05 MED ORDER — HYDROCODONE-ACETAMINOPHEN 5-325 MG PO TABS: ORAL_TABLET | ORAL | 0 refills | Status: DC

## 2019-06-05 MED ORDER — PROPOFOL 500 MG/50ML IV EMUL
INTRAVENOUS | Status: DC | PRN
  Administered 2019-06-05: 50 ug/kg/min via INTRAVENOUS

## 2019-06-05 MED ORDER — PROMETHAZINE HCL 25 MG/ML IJ SOLN: 6.2500 mg | INTRAMUSCULAR | Status: DC | PRN

## 2019-06-05 MED ORDER — HYDROMORPHONE HCL 1 MG/ML IJ SOLN: 0.2500 mg | INTRAMUSCULAR | Status: DC | PRN

## 2019-06-05 SURGICAL SUPPLY — 65 items
APL PRP STRL LF DISP 70% ISPRP (MISCELLANEOUS) ×1
BLADE MINI RND TIP GREEN BEAV (BLADE) IMPLANT
BLADE SURG 15 STRL LF DISP TIS (BLADE) ×2 IMPLANT
BLADE SURG 15 STRL SS (BLADE) ×4
BNDG CMPR 9X4 STRL LF SNTH (GAUZE/BANDAGES/DRESSINGS) ×1
BNDG ELASTIC 2X5.8 VLCR STR LF (GAUZE/BANDAGES/DRESSINGS) IMPLANT
BNDG ELASTIC 3X5.8 VLCR STR LF (GAUZE/BANDAGES/DRESSINGS) ×1 IMPLANT
BNDG ESMARK 4X9 LF (GAUZE/BANDAGES/DRESSINGS) ×1 IMPLANT
BNDG GAUZE ELAST 4 BULKY (GAUZE/BANDAGES/DRESSINGS) ×2 IMPLANT
BONE CHIP PRESERV 5CC PCAN5 (Bone Implant) ×2 IMPLANT
CHLORAPREP W/TINT 26 (MISCELLANEOUS) ×2 IMPLANT
CORD BIPOLAR FORCEPS 12FT (ELECTRODE) ×2 IMPLANT
COVER BACK TABLE 60X90IN (DRAPES) ×2 IMPLANT
COVER MAYO STAND STRL (DRAPES) ×2 IMPLANT
COVER WAND RF STERILE (DRAPES) IMPLANT
CUFF TOURN SGL QUICK 18X4 (TOURNIQUET CUFF) ×2 IMPLANT
DRAPE EXTREMITY T 121X128X90 (DISPOSABLE) ×2 IMPLANT
DRAPE OEC MINIVIEW 54X84 (DRAPES) ×1 IMPLANT
DRAPE SURG 17X23 STRL (DRAPES) ×2 IMPLANT
GAUZE SPONGE 4X4 12PLY STRL (GAUZE/BANDAGES/DRESSINGS) ×2 IMPLANT
GAUZE XEROFORM 1X8 LF (GAUZE/BANDAGES/DRESSINGS) ×2 IMPLANT
GLOVE BIO SURGEON STRL SZ7.5 (GLOVE) ×3 IMPLANT
GLOVE BIOGEL PI IND STRL 7.0 (GLOVE) IMPLANT
GLOVE BIOGEL PI IND STRL 8 (GLOVE) ×1 IMPLANT
GLOVE BIOGEL PI IND STRL 8.5 (GLOVE) IMPLANT
GLOVE BIOGEL PI INDICATOR 7.0 (GLOVE) ×2
GLOVE BIOGEL PI INDICATOR 8 (GLOVE) ×2
GLOVE BIOGEL PI INDICATOR 8.5 (GLOVE) ×1
GLOVE ECLIPSE 6.5 STRL STRAW (GLOVE) ×1 IMPLANT
GLOVE SURG ORTHO 8.0 STRL STRW (GLOVE) ×1 IMPLANT
GOWN STRL REUS W/ TWL LRG LVL3 (GOWN DISPOSABLE) ×1 IMPLANT
GOWN STRL REUS W/ TWL XL LVL3 (GOWN DISPOSABLE) ×1 IMPLANT
GOWN STRL REUS W/TWL LRG LVL3 (GOWN DISPOSABLE) ×2
GOWN STRL REUS W/TWL XL LVL3 (GOWN DISPOSABLE) ×6
GRAFT BNE CANC CHIPS 1-8 5CC (Bone Implant) IMPLANT
K-WIRE .035X4 (WIRE) ×2 IMPLANT
NDL HYPO 25X1 1.5 SAFETY (NEEDLE) IMPLANT
NEEDLE HYPO 22GX1.5 SAFETY (NEEDLE) IMPLANT
NEEDLE HYPO 25X1 1.5 SAFETY (NEEDLE) IMPLANT
NS IRRIG 1000ML POUR BTL (IV SOLUTION) ×2 IMPLANT
PACK BASIN DAY SURGERY FS (CUSTOM PROCEDURE TRAY) ×2 IMPLANT
PAD CAST 3X4 CTTN HI CHSV (CAST SUPPLIES) IMPLANT
PAD CAST 4YDX4 CTTN HI CHSV (CAST SUPPLIES) IMPLANT
PADDING CAST ABS 4INX4YD NS (CAST SUPPLIES) ×1
PADDING CAST ABS COTTON 4X4 ST (CAST SUPPLIES) ×1 IMPLANT
PADDING CAST COTTON 3X4 STRL (CAST SUPPLIES) ×2
PADDING CAST COTTON 4X4 STRL (CAST SUPPLIES)
SLEEVE SCD COMPRESS KNEE MED (MISCELLANEOUS) ×1 IMPLANT
SLING ARM FOAM STRAP LRG (SOFTGOODS) ×1 IMPLANT
SPLINT PLASTER CAST XFAST 3X15 (CAST SUPPLIES) IMPLANT
SPLINT PLASTER CAST XFAST 4X15 (CAST SUPPLIES) IMPLANT
SPLINT PLASTER XTRA FAST SET 4 (CAST SUPPLIES)
SPLINT PLASTER XTRA FASTSET 3X (CAST SUPPLIES) ×10
STOCKINETTE 4X48 STRL (DRAPES) ×2 IMPLANT
SUT CHROMIC 4 0 PS 2 18 (SUTURE) ×1 IMPLANT
SUT ETHILON 3 0 PS 1 (SUTURE) IMPLANT
SUT ETHILON 4 0 PS 2 18 (SUTURE) ×2 IMPLANT
SUT MERSILENE 4 0 P 3 (SUTURE) ×1 IMPLANT
SUT VIC AB 3-0 PS1 18 (SUTURE)
SUT VIC AB 3-0 PS1 18XBRD (SUTURE) IMPLANT
SUT VICRYL 4-0 PS2 18IN ABS (SUTURE) IMPLANT
SYR BULB 3OZ (MISCELLANEOUS) ×2 IMPLANT
SYR CONTROL 10ML LL (SYRINGE) IMPLANT
TOWEL GREEN STERILE FF (TOWEL DISPOSABLE) ×3 IMPLANT
UNDERPAD 30X36 HEAVY ABSORB (UNDERPADS AND DIAPERS) ×1 IMPLANT

## 2019-06-05 NOTE — Discharge Instructions (Addendum)
Post Anesthesia Home Care Instructions  Activity: Get plenty of rest for the remainder of the day. A responsible individual must stay with you for 24 hours following the procedure.  For the next 24 hours, DO NOT: -Drive a car -Paediatric nurse -Drink alcoholic beverages -Take any medication unless instructed by your physician -Make any legal decisions or sign important papers.  Meals: Start with liquid foods such as gelatin or soup. Progress to regular foods as tolerated. Avoid greasy, spicy, heavy foods. If nausea and/or vomiting occur, drink only clear liquids until the nausea and/or vomiting subsides. Call your physician if vomiting continues.  Special Instructions/Symptoms: Your throat may feel dry or sore from the anesthesia or the breathing tube placed in your throat during surgery. If this causes discomfort, gargle with warm salt water. The discomfort should disappear within 24 hours.  If you had a scopolamine patch placed behind your ear for the management of post- operative nausea and/or vomiting:  1. The medication in the patch is effective for 72 hours, after which it should be removed.  Wrap patch in a tissue and discard in the trash. Wash hands thoroughly with soap and water. 2. You may remove the patch earlier than 72 hours if you experience unpleasant side effects which may include dry mouth, dizziness or visual disturbances. 3. Avoid touching the patch. Wash your hands with soap and water after contact with the patch.       Regional Anesthesia Blocks  1. Numbness or the inability to move the "blocked" extremity may last from 3-48 hours after placement. The length of time depends on the medication injected and your individual response to the medication. If the numbness is not going away after 48 hours, call your surgeon.  2. The extremity that is blocked will need to be protected until the numbness is gone and the  Strength has returned. Because you cannot feel it, you  will need to take extra care to avoid injury. Because it may be weak, you may have difficulty moving it or using it. You may not know what position it is in without looking at it while the block is in effect.  3. For blocks in the legs and feet, returning to weight bearing and walking needs to be done carefully. You will need to wait until the numbness is entirely gone and the strength has returned. You should be able to move your leg and foot normally before you try and bear weight or walk. You will need someone to be with you when you first try to ensure you do not fall and possibly risk injury.  4. Bruising and tenderness at the needle site are common side effects and will resolve in a few days.  5. Persistent numbness or new problems with movement should be communicated to the surgeon or the [facility] [phone] [place] [phone]).    Hand Center Instructions Hand Surgery  Wound Care: Keep your hand elevated above the level of your heart.  Do not allow it to dangle by your side.  Keep the dressing dry and do not remove it unless your doctor advises you to do so.  He will usually change it at the time of your post-op visit.  Moving your fingers is advised to stimulate circulation but will depend on the site of your surgery.  If you have a splint applied, your doctor will advise you regarding movement.  Activity: Do not drive or operate machinery today.  Rest today  and then you may return to your normal activity and work as indicated by your physician.  Diet:  Drink liquids today or eat a light diet.  You may resume a regular diet tomorrow.    General expectations: Pain for two to three days. Fingers may become slightly swollen.  Call your doctor if any of the following occur: Severe pain not relieved by pain medication. Elevated temperature. Dressing soaked with blood. Inability to move fingers. White or bluish color to fingers.

## 2019-06-05 NOTE — Transfer of Care (Signed)
Immediate Anesthesia Transfer of Care Note  Patient: Marisa Humphrey  Procedure(s) Performed: OPEN REDUCTION PIN FIXATION, CURRETAGE BONE LESION WITH CANCELLOUS CHIP BONE GRAFTING (Left Finger) EXCISION BONE CYST (Left Finger)  Patient Location: PACU  Anesthesia Type:MAC and Regional  Level of Consciousness: awake, alert  and oriented  Airway & Oxygen Therapy: Patient Spontanous Breathing  Post-op Assessment: Report given to RN and Post -op Vital signs reviewed and stable  Post vital signs: Reviewed and stable  Last Vitals:  Vitals Value Taken Time  BP 125/80 06/05/19 1445  Temp 36.6 C 06/05/19 1444  Pulse 89 06/05/19 1454  Resp 22 06/05/19 1454  SpO2 100 % 06/05/19 1454  Vitals shown include unvalidated device data.  Last Pain:  Vitals:   06/05/19 1441  TempSrc:   PainSc: 0-No pain         Complications: No apparent anesthesia complications

## 2019-06-05 NOTE — Op Note (Signed)
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  Left ring finger open reduction pin fixation of proximal phalanx fracture 2.  Curettage and bone grafting of left ring finger proximal phalanx enchondroma   SURGEON:  Leanora Cover, M.D.   ASSISTANT: Daryll Brod, MD   ANESTHESIA:  Regional with sedation   INTRAVENOUS FLUIDS:  Per anesthesia flow sheet.   ESTIMATED BLOOD LOSS:  Minimal.   COMPLICATIONS:  None.   SPECIMENS:   Left ring finger enchondroma to pathology   TOURNIQUET TIME:    Total Tourniquet Time Documented: Forearm (Left) - 74 minutes Total: Forearm (Left) - 74 minutes    DISPOSITION:  Stable to PACU.   INDICATIONS: 39 year old female sustained fracture of the proximal phalanx of left ring finger.  Radiographs were taken revealing expansile bony lesion consistent with enchondroma at the site of the fracture.  She was splinted and followed up in the office.  Recommended operative curettage of the lesion and pin fixation of the fracture with bone grafting. Risks, benefits and alternatives of surgery were discussed including the risks of blood loss, infection, damage to nerves, vessels, tendons, ligaments, bone for surgery, need for additional surgery, complications with wound healing, continued pain, nonunion, malunion, stiffness.  She voiced understanding of these risks and elected to proceed.  OPERATIVE COURSE:  After being identified preoperatively by myself,  the patient and I agreed on the procedure and site of the procedure.  The surgical site was marked.  Surgical consent had been signed.  She was given IV antibiotics as preoperative antibiotic prophylaxis. She was transferred to the operating room and placed on the operating table in supine position with the Left Left upper extremity on an arm board.  Sedation was induced by the anesthesiologist. A regional block had been performed by anesthesia in preoperative holding.   Left upper extremity was prepped and draped in normal sterile orthopedic fashion.  A surgical pause was performed between the surgeons, anesthesia, and operating room staff and all were in agreement as to the patient, procedure, and site of procedure.  Tourniquet at the proximal aspect of the extremity was inflated to 250 mmHg after exsanguination of the arm with an Esmarch bandage.    Incision was made on the dorsum of the ring finger proximal phalange.  This is carried in subcutaneous tissues by spreading technique.  Bipolar electrocautery was used to obtain hemostasis.  The extensor tendon and periosteum were sharply incised longitudinally.  The freer elevator was used to elevate the periosteum.  The fracture site was easily identified.  The bone was very thinned dorsally.  Curettes were used to scoop out the lesion.  This was consistent with enchondroma.  It was sent to pathology for examination.  Once it was felt that the lesion had been fully curettaged the C-arm was used in AP and lateral projections to ensure appropriate delineation of the entirety of the lesion.  0.035 inch K wires were then used.  These were advanced from the radial and ulnar sides of the base of the proximal phalanx across fashion into the distal fragment.  This was adequate to stabilize the fracture.  The wrist was placed through tenodesis to ensure appropriate position of the finger without scissoring.  C-arm was used in AP and lateral projections to ensure appropriate reduction position of the hardware which was the case.  The bony lesion was then grafted using cancellous bone chips.  C-arm was again  used in AP and lateral projections to ensure complete filling of the lesion which was the case.  The periosteum was then repaired over top using a chromic suture.  The extensor tendon was repaired with a 4-0 Mersilene in a running fashion.  The skin was closed with 4-0 nylon in a horizontal mattress fashion.  The pins were bent and cut short.  The wound and pin sites were dressed with sterile Xeroform 4 x 4's and wrapped with a Kerlix bandage.  A volar splint was placed including the long ring and small fingers with the MPs flexed and the IP is extended.  This was wrapped with Kerlix and Ace bandage.  The tourniquet was deflated at 74 minutes.  Fingertips were pink with brisk capillary refill after deflation of tourniquet.  The operative  drapes were broken down.  The patient was awoken from anesthesia safely.  She was transferred back to the stretcher and taken to PACU in stable condition.  I will see her back in the office in 1 week for postoperative followup.  I will give her a prescription for Norco 5/325 1-2 tabs PO q6 hours prn pain, dispense # 20.   Leanora Cover, MD Electronically signed, 06/05/19

## 2019-06-05 NOTE — Anesthesia Preprocedure Evaluation (Signed)
Anesthesia Evaluation  Patient identified by MRN, date of birth, ID band Patient awake    Reviewed: Allergy & Precautions, H&P , NPO status , Patient's Chart, lab work & pertinent test results  History of Anesthesia Complications Negative for: history of anesthetic complications  Airway Mallampati: II  TM Distance: >3 FB Neck ROM: full    Dental no notable dental hx. (+) Teeth Intact   Pulmonary former smoker,  8 pack year history    Pulmonary exam normal breath sounds clear to auscultation       Cardiovascular negative cardio ROS   Rhythm:regular Rate:Normal     Neuro/Psych PSYCHIATRIC DISORDERS Anxiety Depression negative neurological ROS     GI/Hepatic Neg liver ROS, GERD  ,  Endo/Other  Morbid obesityBMI 42  Renal/GU negative Renal ROS  negative genitourinary   Musculoskeletal negative musculoskeletal ROS (+)   Abdominal Normal abdominal exam  (+)   Peds  Hematology negative hematology ROS (+)   Anesthesia Other Findings L ring finger fracture   Reproductive/Obstetrics (+) Pregnancy                             Anesthesia Physical Anesthesia Plan  ASA: III  Anesthesia Plan: MAC and Regional   Post-op Pain Management:    Induction:   PONV Risk Score and Plan: 2 and Propofol infusion and TIVA  Airway Management Planned: Natural Airway and Simple Face Mask  Additional Equipment: None  Intra-op Plan:   Post-operative Plan:   Informed Consent: I have reviewed the patients History and Physical, chart, labs and discussed the procedure including the risks, benefits and alternatives for the proposed anesthesia with the patient or authorized representative who has indicated his/her understanding and acceptance.       Plan Discussed with: CRNA  Anesthesia Plan Comments:         Anesthesia Quick Evaluation

## 2019-06-05 NOTE — Op Note (Signed)
I assisted Surgeon(s) and Role:    * Leanora Cover, MD - Primary    Daryll Brod, MD - Assisting on the Procedure(s): OPEN REDUCTION PIN FIXATION, CURRETAGE BONE LESION WITH CANCELLOUS CHIP BONE GRAFTING EXCISION BONE CYST on 06/05/2019.  I provided assistance on this case as follows: setup,approach, removal of the tumor, stabilization  Of the fracture, fixation of the fracture, placement of the bone graft, closure of the wound and application of the dressings and splint. Electronically signed by: Daryll Brod, MD Date: 06/05/2019 Time: 2:33 PM

## 2019-06-05 NOTE — Progress Notes (Signed)
Assisted Dr. Doroteo Glassman with left, ultrasound guided, supraclavicular block. Side rails up, monitors on throughout procedure. See vital signs in flow sheet. Tolerated Procedure well.

## 2019-06-05 NOTE — H&P (Signed)
Marisa Humphrey is an 39 y.o. female.   Chief Complaint: left ring finger fracture HPI: 39 yo female with fracture left ring finger through enchondroma appearing lesion.  She wishes to have curettage of the lesion with bone grafting and fixation of the fracture.    Allergies:  Allergies  Allergen Reactions  . Penicillins Rash    Amoxil has been taken w/o reaction    Past Medical History:  Diagnosis Date  . ADD (attention deficit disorder)   . Anxiety   . Depression   . GERD (gastroesophageal reflux disease)   . History of condyloma acuminatum     Past Surgical History:  Procedure Laterality Date  . LAPAROSCOPIC GASTRIC BANDING  11/09/2005   Dr.Martin  . MOLE REMOVAL      Family History: Family History  Problem Relation Age of Onset  . Heart attack Father 41  . Heart disease Father 94       MI  . Diabetes Mother   . Hypertension Mother   . Hyperlipidemia Mother   . Cancer Other        breast  . Diabetes Maternal Grandmother   . Multiple sclerosis Brother     Social History:   reports that she has quit smoking. Her smoking use included cigarettes. She has a 8.00 pack-year smoking history. She has never used smokeless tobacco. She reports that she does not drink alcohol or use drugs.  Medications: Medications Prior to Admission  Medication Sig Dispense Refill  . ALPRAZolam (XANAX) 0.25 MG tablet Take 1 tablet (0.25 mg total) by mouth 2 (two) times daily as needed for anxiety. 20 tablet 0  . amphetamine-dextroamphetamine (ADDERALL XR) 30 MG 24 hr capsule Take 1 capsule (30 mg total) by mouth every morning. 30 capsule 0  . amphetamine-dextroamphetamine (ADDERALL XR) 30 MG 24 hr capsule Take 1 capsule (30 mg total) by mouth every morning. 30 capsule 0  . amphetamine-dextroamphetamine (ADDERALL XR) 30 MG 24 hr capsule Take 1 capsule (30 mg total) by mouth every morning. 90 capsule 0  . buPROPion (WELLBUTRIN XL) 300 MG 24 hr tablet Take 1 tablet (300 mg total)  by mouth daily. 90 tablet 3  . HYDROcodone-acetaminophen (NORCO/VICODIN) 5-325 MG tablet Take 1 tablet by mouth every 6 (six) hours as needed for moderate pain. 15 tablet 0    Results for orders placed or performed during the hospital encounter of 06/05/19 (from the past 48 hour(s))  Pregnancy, urine POC     Status: None   Collection Time: 06/05/19 11:22 AM  Result Value Ref Range   Preg Test, Ur NEGATIVE NEGATIVE    Comment:        THE SENSITIVITY OF THIS METHODOLOGY IS >24 mIU/mL     No results found.   A comprehensive review of systems was negative.  Blood pressure 125/87, pulse 85, temperature (!) 97.3 F (36.3 C), temperature source Tympanic, resp. rate (!) 0, height 5\' 7"  (1.702 m), weight 121 kg, last menstrual period 05/31/2019, SpO2 100 %.  General appearance: alert, cooperative and appears stated age Head: Normocephalic, without obvious abnormality, atraumatic Neck: supple, symmetrical, trachea midline Cardio: regular rate and rhythm Resp: clear to auscultation bilaterally Extremities: Intact sensation and capillary refill all digits.  +epl/fpl/io.  No wounds.  Pulses: 2+ and symmetric Skin: Skin color, texture, turgor normal. No rashes or lesions Neurologic: Grossly normal Incision/Wound: none  Assessment/Plan Left ring finger fracture and enchondroma.  Plan curettage with bone grafting and fixation of fracture.  Non  operative and operative treatment options have been discussed with the patient and patient wishes to proceed with operative treatment. Risks, benefits, and alternatives of surgery have been discussed and the patient agrees with the plan of care.   Leanora Cover 06/05/2019, 12:44 PM

## 2019-06-05 NOTE — Anesthesia Postprocedure Evaluation (Signed)
Anesthesia Post Note  Patient: Marisa Humphrey  Procedure(s) Performed: OPEN REDUCTION PIN FIXATION, CURRETAGE BONE LESION WITH CANCELLOUS CHIP BONE GRAFTING (Left Finger) EXCISION BONE CYST (Left Finger)     Patient location during evaluation: PACU Anesthesia Type: Regional and MAC Level of consciousness: awake and alert Pain management: pain level controlled Vital Signs Assessment: post-procedure vital signs reviewed and stable Respiratory status: spontaneous breathing, nonlabored ventilation, respiratory function stable and patient connected to nasal cannula oxygen Cardiovascular status: stable and blood pressure returned to baseline Postop Assessment: no apparent nausea or vomiting Anesthetic complications: no    Last Vitals:  Vitals:   06/05/19 1444 06/05/19 1445  BP:  125/80  Pulse:  79  Resp:  14  Temp: 36.6 C   SpO2:  99%    Last Pain:  Vitals:   06/05/19 1441  TempSrc:   PainSc: 0-No pain                 Pervis Hocking

## 2019-06-06 ENCOUNTER — Encounter: Payer: Self-pay | Admitting: *Deleted

## 2019-06-07 ENCOUNTER — Encounter: Payer: Self-pay | Admitting: Certified Registered"

## 2019-06-07 NOTE — Addendum Note (Signed)
Addendum  created 06/07/19 1158 by Tawni Millers, CRNA   Charge Capture section accepted

## 2019-06-11 LAB — SURGICAL PATHOLOGY

## 2019-06-12 ENCOUNTER — Other Ambulatory Visit: Payer: Self-pay | Admitting: Family Medicine

## 2019-06-12 DIAGNOSIS — F419 Anxiety disorder, unspecified: Secondary | ICD-10-CM

## 2019-06-12 MED ORDER — ALPRAZOLAM 0.25 MG PO TABS: 0.2500 mg | ORAL_TABLET | Freq: Two times a day (BID) | ORAL | 0 refills | Status: DC | PRN

## 2019-06-12 NOTE — Telephone Encounter (Signed)
Requesting: Xanax Contract: 10/13/2018 UDS: 10/13/2018, low risk Last OV: 05/28/19 w/ Wendling Next OV: 10/16/19 Last Refill: 05/03/19, #20--0 RF Database:   Please advise

## 2019-06-26 ENCOUNTER — Other Ambulatory Visit: Payer: Self-pay | Admitting: Family Medicine

## 2019-06-26 DIAGNOSIS — F988 Other specified behavioral and emotional disorders with onset usually occurring in childhood and adolescence: Secondary | ICD-10-CM

## 2019-06-26 MED ORDER — AMPHETAMINE-DEXTROAMPHET ER 30 MG PO CP24: 30.0000 mg | ORAL_CAPSULE | ORAL | 0 refills | Status: DC

## 2019-06-26 NOTE — Telephone Encounter (Signed)
Adderall refill.   Last OV: 04/19/2019 Last Fill: 03/06/2019 #30 and 0RF (For January, Feb, March 2021) UDS: 10/13/2018 Low risk

## 2019-07-06 ENCOUNTER — Other Ambulatory Visit: Payer: Self-pay | Admitting: Family Medicine

## 2019-07-06 ENCOUNTER — Telehealth: Payer: Self-pay | Admitting: *Deleted

## 2019-07-06 DIAGNOSIS — F419 Anxiety disorder, unspecified: Secondary | ICD-10-CM

## 2019-07-06 MED ORDER — ALPRAZOLAM 0.25 MG PO TABS: 0.2500 mg | ORAL_TABLET | Freq: Two times a day (BID) | ORAL | 0 refills | Status: DC | PRN

## 2019-07-06 NOTE — Telephone Encounter (Signed)
Can you fill in Dr. Ivy Lynn absence?  Last written: 06/12/19 Last ov: 10/13/18 Next ov: none Contract: 10/13/18 UDS: 10/13/18

## 2019-07-06 NOTE — Telephone Encounter (Signed)
Requesting: xanax Contract:10/23/18 UDS:10/13/18 Last Visit:04/19/19 Next Visit:10/16/19 Last Refill:06/12/19  Please Advise

## 2019-07-09 ENCOUNTER — Other Ambulatory Visit: Payer: Self-pay | Admitting: Family Medicine

## 2019-07-09 DIAGNOSIS — F419 Anxiety disorder, unspecified: Secondary | ICD-10-CM

## 2019-07-09 MED ORDER — ALPRAZOLAM 0.25 MG PO TABS: 0.2500 mg | ORAL_TABLET | Freq: Two times a day (BID) | ORAL | 0 refills | Status: DC | PRN

## 2019-07-31 ENCOUNTER — Other Ambulatory Visit: Payer: Self-pay | Admitting: Family Medicine

## 2019-07-31 DIAGNOSIS — F419 Anxiety disorder, unspecified: Secondary | ICD-10-CM

## 2019-08-01 NOTE — Telephone Encounter (Signed)
Refills have been requested for the following medications:      ALPRAZolam (XANAX) 0.25 MG tablet [Yvonne R Carollee Herter, DO]     Patient Comment: Please cancel rx sent to [facility] and resend to [place].  I did not pick the rx up from [facility].   Preferred pharmacy: [facility], [place] [address]   Patient states we sent medication to the wrong pharmacy , we [facility]

## 2019-08-02 ENCOUNTER — Telehealth: Payer: Self-pay | Admitting: Family Medicine

## 2019-08-02 ENCOUNTER — Other Ambulatory Visit: Payer: Self-pay

## 2019-08-02 DIAGNOSIS — F419 Anxiety disorder, unspecified: Secondary | ICD-10-CM

## 2019-08-02 NOTE — Telephone Encounter (Signed)
Patient called to check status of her Xanax. Previous msg shows sent to CMA to have script sent to [facility] instead of [facility]. CMA states she will sent over to [facility] for approval

## 2019-08-03 ENCOUNTER — Other Ambulatory Visit: Payer: Self-pay | Admitting: Family Medicine

## 2019-08-03 ENCOUNTER — Telehealth: Payer: Self-pay

## 2019-08-03 DIAGNOSIS — F419 Anxiety disorder, unspecified: Secondary | ICD-10-CM

## 2019-08-03 DIAGNOSIS — F988 Other specified behavioral and emotional disorders with onset usually occurring in childhood and adolescence: Secondary | ICD-10-CM

## 2019-08-03 MED ORDER — AMPHETAMINE-DEXTROAMPHET ER 30 MG PO CP24: 30.0000 mg | ORAL_CAPSULE | ORAL | 0 refills | Status: DC

## 2019-08-03 MED ORDER — ALPRAZOLAM 0.25 MG PO TABS: 0.2500 mg | ORAL_TABLET | Freq: Two times a day (BID) | ORAL | 0 refills | Status: DC | PRN

## 2019-08-03 NOTE — Progress Notes (Signed)
done

## 2019-08-03 NOTE — Telephone Encounter (Signed)
Last OV--04/19/2019 Next OV--10/16/2019 Last RF--07/09/2019--#20 no refills (Dr. Nani Ravens filled) [facility]--04/25/2017 UDS--low risk on 10/13/2018

## 2019-08-03 NOTE — Telephone Encounter (Signed)
Med sent to the correct pharmacy

## 2019-08-03 NOTE — Progress Notes (Signed)
Pt needed Xanax resent.  Med pended

## 2019-08-03 NOTE — Telephone Encounter (Signed)
Patient called in to get a prescription refill for ALPRAZolam Duanne Moron) 0.25 MG tablet [[id number]]    Please send it to [facility] at [facility], [place] - [place], [place] [address]  Phone:  [phone] Fax:  [phone]  DEA #:  --

## 2019-08-24 ENCOUNTER — Other Ambulatory Visit: Payer: Self-pay | Admitting: Family Medicine

## 2019-08-24 DIAGNOSIS — F419 Anxiety disorder, unspecified: Secondary | ICD-10-CM

## 2019-08-24 MED ORDER — ALPRAZOLAM 0.25 MG PO TABS: 0.2500 mg | ORAL_TABLET | Freq: Two times a day (BID) | ORAL | 0 refills | Status: DC | PRN

## 2019-08-24 NOTE — Telephone Encounter (Signed)
Requesting: xanax Contract:10/23/2018 UDS:10/13/2018 Last Visit: 05/28/19 Next Visit:10/16/19 Last Refill:08/03/19  Please Advise

## 2019-09-21 ENCOUNTER — Other Ambulatory Visit: Payer: Self-pay | Admitting: Family Medicine

## 2019-09-21 DIAGNOSIS — F419 Anxiety disorder, unspecified: Secondary | ICD-10-CM

## 2019-09-21 NOTE — Telephone Encounter (Signed)
Requesting: Xanax Contract: 10/13/2018 UDS: 10/13/2018 Last OV: 05/28/19 w/ Wendling Next OV: 10/16/2019 Last Refill: 08/24/2019, #20--0 RF Database:   Please advise

## 2019-09-24 MED ORDER — ALPRAZOLAM 0.25 MG PO TABS: 0.2500 mg | ORAL_TABLET | Freq: Two times a day (BID) | ORAL | 0 refills | Status: DC | PRN

## 2019-10-16 ENCOUNTER — Encounter: Payer: No Typology Code available for payment source | Admitting: Family Medicine

## 2019-10-23 ENCOUNTER — Other Ambulatory Visit: Payer: Self-pay | Admitting: Family Medicine

## 2019-10-23 DIAGNOSIS — F419 Anxiety disorder, unspecified: Secondary | ICD-10-CM

## 2019-10-24 MED ORDER — ALPRAZOLAM 0.25 MG PO TABS: 0.2500 mg | ORAL_TABLET | Freq: Two times a day (BID) | ORAL | 0 refills | Status: DC | PRN

## 2019-10-24 NOTE — Telephone Encounter (Signed)
Alprazolam refill.   Last OV: 05/28/2019 w/ Dr. Nani Ravens Appt scheduled 11/29/2019 Last Fill: 09/24/2019 #20 and 0RF Pt sig:1 tab bid prn UDS: 10/13/2018 Low risk

## 2019-11-20 ENCOUNTER — Other Ambulatory Visit: Payer: Self-pay | Admitting: Family Medicine

## 2019-11-20 DIAGNOSIS — F419 Anxiety disorder, unspecified: Secondary | ICD-10-CM

## 2019-11-20 MED ORDER — ALPRAZOLAM 0.25 MG PO TABS: 0.2500 mg | ORAL_TABLET | Freq: Two times a day (BID) | ORAL | 0 refills | Status: DC | PRN

## 2019-11-20 NOTE — Telephone Encounter (Signed)
Requesting: xanax Contract:10/23/18 UDS:10/13/18 Last Visit:05/28/19 Next Visit:11/29/19 Last Refill:10/24/19  Please Advise

## 2019-11-27 ENCOUNTER — Encounter: Payer: Self-pay | Admitting: Family Medicine

## 2019-11-27 DIAGNOSIS — Z0000 Encounter for general adult medical examination without abnormal findings: Secondary | ICD-10-CM

## 2019-11-27 NOTE — Telephone Encounter (Signed)
Please advise 

## 2019-11-27 NOTE — Telephone Encounter (Signed)
Ok to get cbcd, cmp, lipid, tsh

## 2019-11-28 NOTE — Addendum Note (Signed)
Addended by: Sanda Linger on: 11/28/2019 08:32 AM   Modules accepted: Orders

## 2019-11-29 ENCOUNTER — Encounter: Payer: Self-pay | Admitting: Family Medicine

## 2019-11-29 ENCOUNTER — Ambulatory Visit (INDEPENDENT_AMBULATORY_CARE_PROVIDER_SITE_OTHER): Payer: No Typology Code available for payment source | Admitting: Family Medicine

## 2019-11-29 ENCOUNTER — Other Ambulatory Visit: Payer: Self-pay

## 2019-11-29 VITALS — BP 124/80 | HR 91 | Temp 98.4°F | Resp 18 | Ht 67.0 in | Wt 269.0 lb

## 2019-11-29 DIAGNOSIS — Z0000 Encounter for general adult medical examination without abnormal findings: Secondary | ICD-10-CM | POA: Diagnosis not present

## 2019-11-29 DIAGNOSIS — Z23 Encounter for immunization: Secondary | ICD-10-CM | POA: Diagnosis not present

## 2019-11-29 NOTE — Patient Instructions (Signed)
Preventive Care 65-37 Years Old, Female Preventive care refers to visits with your health care provider and lifestyle choices that can promote health and wellness. This includes:  A yearly physical exam. This may also be called an annual well check.  Regular dental visits and eye exams.  Immunizations.  Screening for certain conditions.  Healthy lifestyle choices, such as eating a healthy diet, getting regular exercise, not using drugs or products that contain nicotine and tobacco, and limiting alcohol use. What can I expect for my preventive care visit? Physical exam Your health care provider will check your:  Height and weight. This may be used to calculate body mass index (BMI), which tells if you are at a healthy weight.  Heart rate and blood pressure.  Skin for abnormal spots. Counseling Your health care provider may ask you questions about your:  Alcohol, tobacco, and drug use.  Emotional well-being.  Home and relationship well-being.  Sexual activity.  Eating habits.  Work and work Statistician.  Method of birth control.  Menstrual cycle.  Pregnancy history. What immunizations do I need?  Influenza (flu) vaccine  This is recommended every year. Tetanus, diphtheria, and pertussis (Tdap) vaccine  You may need a Td booster every 10 years. Varicella (chickenpox) vaccine  You may need this if you have not been vaccinated. Human papillomavirus (HPV) vaccine  If recommended by your health care provider, you may need three doses over 6 months. Measles, mumps, and rubella (MMR) vaccine  You may need at least one dose of MMR. You may also need a second dose. Meningococcal conjugate (MenACWY) vaccine  One dose is recommended if you are age 56-21 years and a first-year college student living in a residence hall, or if you have one of several medical conditions. You may also need additional booster doses. Pneumococcal conjugate (PCV13) vaccine  You may need  this if you have certain conditions and were not previously vaccinated. Pneumococcal polysaccharide (PPSV23) vaccine  You may need one or two doses if you smoke cigarettes or if you have certain conditions. Hepatitis A vaccine  You may need this if you have certain conditions or if you travel or work in places where you may be exposed to hepatitis A. Hepatitis B vaccine  You may need this if you have certain conditions or if you travel or work in places where you may be exposed to hepatitis B. Haemophilus influenzae type b (Hib) vaccine  You may need this if you have certain conditions. You may receive vaccines as individual doses or as more than one vaccine together in one shot (combination vaccines). Talk with your health care provider about the risks and benefits of combination vaccines. What tests do I need?  Blood tests  Lipid and cholesterol levels. These may be checked every 5 years starting at age 20.  Hepatitis C test.  Hepatitis B test. Screening  Diabetes screening. This is done by checking your blood sugar (glucose) after you have not eaten for a while (fasting).  Sexually transmitted disease (STD) testing.  BRCA-related cancer screening. This may be done if you have a family history of breast, ovarian, tubal, or peritoneal cancers.  Pelvic exam and Pap test. This may be done every 3 years starting at age 42. Starting at age 37, this may be done every 5 years if you have a Pap test in combination with an HPV test. Talk with your health care provider about your test results, treatment options, and if necessary, the need for more tests.  Follow these instructions at home: Eating and drinking   Eat a diet that includes fresh fruits and vegetables, whole grains, lean protein, and low-fat dairy.  Take vitamin and mineral supplements as recommended by your health care provider.  Do not drink alcohol if: ? Your health care provider tells you not to drink. ? You are  pregnant, may be pregnant, or are planning to become pregnant.  If you drink alcohol: ? Limit how much you have to 0-1 drink a day. ? Be aware of how much alcohol is in your drink. In the [place] one drink equals one 12 oz bottle of beer (355 mL), one 5 oz glass of wine (148 mL), or one 1 oz glass of hard liquor (44 mL). Lifestyle  Take daily care of your teeth and gums.  Stay active. Exercise for at least 30 minutes on 5 or more days each week.  Do not use any products that contain nicotine or tobacco, such as cigarettes, e-cigarettes, and chewing tobacco. If you need help quitting, ask your health care provider.  If you are sexually active, practice safe sex. Use a condom or other form of birth control (contraception) in order to prevent pregnancy and STIs (sexually transmitted infections). If you plan to become pregnant, see your health care provider for a preconception visit. What's next?  Visit your health care provider once a year for a well check visit.  Ask your health care provider how often you should have your eyes and teeth checked.  Stay up to date on all vaccines. This information is not intended to replace advice given to you by your health care provider. Make sure you discuss any questions you have with your health care provider. Document Revised: 10/27/2017 Document Reviewed: 10/27/2017 Elsevier Patient Education  2020 [employer].

## 2019-11-29 NOTE — Progress Notes (Signed)
Subjective:     Marisa Humphrey is a 39 y.o. female and is here for a comprehensive physical exam. The patient reports no problems.  Social History   Socioeconomic History  . Marital status: Married    Spouse name: Not on file  . Number of children: 1  . Years of education: Not on file  . Highest education level: Not on file  Occupational History  . Occupation: Optician, dispensing: [employer]    Employer: [employer]  Tobacco Use  . Smoking status: Former Smoker    Packs/day: 0.50    Years: 16.00    Pack years: 8.00    Types: Cigarettes  . Smokeless tobacco: Never Used  Vaping Use  . Vaping Use: Never used  Substance and Sexual Activity  . Alcohol use: No  . Drug use: No  . Sexual activity: Yes    Partners: Male  Other Topics Concern  . Not on file  Social History Narrative   Exercising --no   Social Determinants of Health   Financial Resource Strain:   . Difficulty of Paying Living Expenses: Not on file  Food Insecurity:   . Worried About Charity fundraiser in the Last Year: Not on file  . Ran Out of Food in the Last Year: Not on file  Transportation Needs:   . Lack of Transportation (Medical): Not on file  . Lack of Transportation (Non-Medical): Not on file  Physical Activity:   . Days of Exercise per Week: Not on file  . Minutes of Exercise per Session: Not on file  Stress:   . Feeling of Stress : Not on file  Social Connections:   . Frequency of Communication with Friends and Family: Not on file  . Frequency of Social Gatherings with Friends and Family: Not on file  . Attends Religious Services: Not on file  . Active Member of Clubs or Organizations: Not on file  . Attends Archivist Meetings: Not on file  . Marital Status: Not on file  Intimate Partner Violence:   . Fear of Current or Ex-Partner: Not on file  . Emotionally Abused: Not on file  . Physically Abused: Not on file  . Sexually Abused: Not on file    Health Maintenance  Topic Date Due  . Hepatitis C Screening  Never done  . COVID-19 Vaccine (2 - Pfizer 2-dose series) 03/21/2019  . INFLUENZA VACCINE  09/30/2019  . PAP SMEAR-Modifier  10/09/2021  . TETANUS/TDAP  07/18/2023  . HIV Screening  Completed    The following portions of the patient's history were reviewed and updated as appropriate:  She  has a past medical history of ADD (attention deficit disorder), Anxiety, Depression, GERD (gastroesophageal reflux disease), and History of condyloma acuminatum. She does not have any pertinent problems on file. She  has a past surgical history that includes Laparoscopic gastric banding (11/09/2005); Mole removal; Open reduction internal fixation (orif) finger with radial bone graft (Left, 06/05/2019); and Excision bone cyst (Left, 06/05/2019). Her family history includes Cancer in an other family member; Diabetes in her maternal grandmother and mother; Heart attack (age of onset: 36) in her father; Heart disease (age of onset: 17) in her father; Hyperlipidemia in her mother; Hypertension in her mother; Multiple sclerosis in her brother. She  reports that she has quit smoking. Her smoking use included cigarettes. She has a 8.00 pack-year smoking history. She has never used smokeless tobacco. She reports that she  does not drink alcohol and does not use drugs. She has a current medication list which includes the following prescription(s): alprazolam, amphetamine-dextroamphetamine, amphetamine-dextroamphetamine, amphetamine-dextroamphetamine, and bupropion. Current Outpatient Medications on File Prior to Visit  Medication Sig Dispense Refill  . ALPRAZolam (XANAX) 0.25 MG tablet Take 1 tablet (0.25 mg total) by mouth 2 (two) times daily as needed for anxiety. 20 tablet 0  . amphetamine-dextroamphetamine (ADDERALL XR) 30 MG 24 hr capsule Take 1 capsule (30 mg total) by mouth every morning. 30 capsule 0  . amphetamine-dextroamphetamine (ADDERALL XR) 30 MG  24 hr capsule Take 1 capsule (30 mg total) by mouth every morning. 30 capsule 0  . amphetamine-dextroamphetamine (ADDERALL XR) 30 MG 24 hr capsule Take 1 capsule (30 mg total) by mouth every morning. 30 capsule 0  . buPROPion (WELLBUTRIN XL) 300 MG 24 hr tablet Take 1 tablet (300 mg total) by mouth daily. 90 tablet 3   No current facility-administered medications on file prior to visit.   She is allergic to penicillins..  Review of Systems Review of Systems  Constitutional: Negative for activity change, appetite change and fatigue.  HENT: Negative for hearing loss, congestion, tinnitus and ear discharge.  dentist q19m Eyes: Negative for visual disturbance (see optho q1y -- vision corrected to 20/20 with glasses).  Respiratory: Negative for cough, chest tightness and shortness of breath.   Cardiovascular: Negative for chest pain, palpitations and leg swelling.  Gastrointestinal: Negative for abdominal pain, diarrhea, constipation and abdominal distention.  Genitourinary: Negative for urgency, frequency, decreased urine volume and difficulty urinating.  Musculoskeletal: Negative for back pain, arthralgias and gait problem.  Skin: Negative for color change, pallor and rash.  Neurological: Negative for dizziness, light-headedness, numbness and headaches.  Hematological: Negative for adenopathy. Does not bruise/bleed easily.  Psychiatric/Behavioral: Negative for suicidal ideas, confusion, sleep disturbance, self-injury, dysphoric mood, decreased concentration and agitation.       Objective:    BP 124/80 (BP Location: Right Arm, Patient Position: Sitting, Cuff Size: Large)   Pulse 91   Temp 98.4 F (36.9 C) (Oral)   Resp 18   Ht 5\' 7"  (1.702 m)   Wt 269 lb (122 kg)   SpO2 100%   BMI 42.13 kg/m  General appearance: alert, cooperative, appears stated age and no distress Head: Normocephalic, without obvious abnormality, atraumatic Eyes: negative findings: lids and lashes normal,  conjunctivae and sclerae normal and pupils equal, round, reactive to light and accomodation Ears: normal TM's and external ear canals both ears Nose: Nares normal. Septum midline. Mucosa normal. No drainage or sinus tenderness. Throat: lips, mucosa, and tongue normal; teeth and gums normal Neck: no adenopathy, no carotid bruit, no JVD, supple, symmetrical, trachea midline and thyroid not enlarged, symmetric, no tenderness/mass/nodules Back: symmetric, no curvature. ROM normal. No CVA tenderness. Lungs: clear to auscultation bilaterally Breasts: gyn Heart: regular rate and rhythm, S1, S2 normal, no murmur, click, rub or gallop Abdomen: soft, non-tender; bowel sounds normal; no masses,  no organomegaly Pelvic: deferred--gyn Extremities: extremities normal, atraumatic, no cyanosis or edema Pulses: 2+ and symmetric Skin: Skin color, texture, turgor normal. No rashes or lesions Lymph nodes: Cervical, supraclavicular, and axillary nodes normal. Neurologic: Alert and oriented X 3, normal strength and tone. Normal symmetric reflexes. Normal coordination and gait    Assessment:    Healthy female exam.      Plan:    ghm utd Check labs  See After Visit Summary for Counseling Recommendations    1. Need for influenza vaccination  - Flu  Vaccine QUAD 36+ mos IM  2. Preventative health care See above  - TSH - Lipid panel - Comprehensive metabolic panel - CBC with Differential/Platelet  3. Morbid obesity ([place])   - Ambulatory referral to General Surgery - Amb Ref to Medical Weight Management

## 2019-11-30 LAB — COMPREHENSIVE METABOLIC PANEL
AG Ratio: 1.9 (calc) (ref 1.0–2.5)
ALT: 11 U/L (ref 6–29)
AST: 9 U/L — ABNORMAL LOW (ref 10–30)
Albumin: 4.3 g/dL (ref 3.6–5.1)
Alkaline phosphatase (APISO): 49 U/L (ref 31–125)
BUN: 12 mg/dL (ref 7–25)
CO2: 25 mmol/L (ref 20–32)
Calcium: 9.3 mg/dL (ref 8.6–10.2)
Chloride: 103 mmol/L (ref 98–110)
Creat: 0.94 mg/dL (ref 0.50–1.10)
Globulin: 2.3 g/dL (calc) (ref 1.9–3.7)
Glucose, Bld: 100 mg/dL — ABNORMAL HIGH (ref 65–99)
Potassium: 3.9 mmol/L (ref 3.5–5.3)
Sodium: 138 mmol/L (ref 135–146)
Total Bilirubin: 0.3 mg/dL (ref 0.2–1.2)
Total Protein: 6.6 g/dL (ref 6.1–8.1)

## 2019-11-30 LAB — LIPID PANEL
Cholesterol: 117 mg/dL (ref <200)
HDL: 41 mg/dL — ABNORMAL LOW (ref >=50)
LDL Cholesterol (Calc): 58 mg/dL (calc)
Non-HDL Cholesterol (Calc): 76 mg/dL (calc) (ref <130)
Total CHOL/HDL Ratio: 2.9 (calc) (ref <5.0)
Triglycerides: 92 mg/dL (ref <150)

## 2019-11-30 LAB — TSH: TSH: 1.78 mIU/L

## 2019-11-30 LAB — CBC WITH DIFFERENTIAL/PLATELET
Absolute Monocytes: 525 cells/uL (ref 200–950)
Basophils Absolute: 40 cells/uL (ref 0–200)
Basophils Relative: 0.4 %
Eosinophils Absolute: 192 cells/uL (ref 15–500)
Eosinophils Relative: 1.9 %
HCT: 44.1 % (ref 35.0–45.0)
Hemoglobin: 14.8 g/dL (ref 11.7–15.5)
Lymphs Abs: 2485 cells/uL (ref 850–3900)
MCH: 30.5 pg (ref 27.0–33.0)
MCHC: 33.6 g/dL (ref 32.0–36.0)
MCV: 90.9 fL (ref 80.0–100.0)
MPV: 10.5 fL (ref 7.5–12.5)
Monocytes Relative: 5.2 %
Neutro Abs: 6858 cells/uL (ref 1500–7800)
Neutrophils Relative %: 67.9 %
Platelets: 346 10*3/uL (ref 140–400)
RBC: 4.85 10*6/uL (ref 3.80–5.10)
RDW: 12.3 % (ref 11.0–15.0)
Total Lymphocyte: 24.6 %
WBC: 10.1 10*3/uL (ref 3.8–10.8)

## 2019-12-20 ENCOUNTER — Other Ambulatory Visit: Payer: Self-pay | Admitting: Family Medicine

## 2019-12-20 DIAGNOSIS — F419 Anxiety disorder, unspecified: Secondary | ICD-10-CM

## 2019-12-20 MED ORDER — ALPRAZOLAM 0.25 MG PO TABS: 0.2500 mg | ORAL_TABLET | Freq: Two times a day (BID) | ORAL | 0 refills | Status: DC | PRN

## 2019-12-20 NOTE — Telephone Encounter (Signed)
Requesting: alprazolam 0.25mg  Contract: 11/29/2019 UDS: 10/13/2018 Last Visit: 11/29/2019 Next Visit: None scheduled Last Refill: 11/20/2019 #20 and 0RF  Please Advise

## 2019-12-28 ENCOUNTER — Other Ambulatory Visit: Payer: Self-pay | Admitting: Family Medicine

## 2019-12-28 DIAGNOSIS — F988 Other specified behavioral and emotional disorders with onset usually occurring in childhood and adolescence: Secondary | ICD-10-CM

## 2019-12-28 NOTE — Telephone Encounter (Signed)
NEW [facility]ARM[facility]  Amphetamine-Dextroamphetamine 30 MG   [place], [place] Phone:  [phone]  Fax:  [phone]

## 2019-12-31 ENCOUNTER — Other Ambulatory Visit: Payer: Self-pay | Admitting: Family Medicine

## 2019-12-31 DIAGNOSIS — F988 Other specified behavioral and emotional disorders with onset usually occurring in childhood and adolescence: Secondary | ICD-10-CM

## 2019-12-31 MED ORDER — AMPHETAMINE-DEXTROAMPHET ER 30 MG PO CP24: 30.0000 mg | ORAL_CAPSULE | ORAL | 0 refills | Status: DC

## 2019-12-31 NOTE — Telephone Encounter (Signed)
Can you deny? You've already refilled today.

## 2019-12-31 NOTE — Telephone Encounter (Signed)
Patient wants to use [facility]  Last written: 08/03/19 Last ov: 11/29/19 Next ov: none Contract: 10/13/18 UDS: 10/13/18 [employer] employee

## 2019-12-31 NOTE — Telephone Encounter (Signed)
patient would like medication to go to   [facility] at [facility], [place] - [address], [place] [address]  Phone:  [phone] Fax:  [phone]

## 2020-01-04 ENCOUNTER — Ambulatory Visit (INDEPENDENT_AMBULATORY_CARE_PROVIDER_SITE_OTHER): Payer: No Typology Code available for payment source

## 2020-01-04 ENCOUNTER — Other Ambulatory Visit: Payer: Self-pay

## 2020-01-04 DIAGNOSIS — Z23 Encounter for immunization: Secondary | ICD-10-CM | POA: Diagnosis not present

## 2020-01-11 ENCOUNTER — Other Ambulatory Visit: Payer: Self-pay | Admitting: Family Medicine

## 2020-01-11 DIAGNOSIS — F419 Anxiety disorder, unspecified: Secondary | ICD-10-CM

## 2020-01-11 MED ORDER — ALPRAZOLAM 0.25 MG PO TABS: 0.2500 mg | ORAL_TABLET | Freq: Two times a day (BID) | ORAL | 0 refills | Status: DC | PRN

## 2020-01-11 NOTE — Telephone Encounter (Signed)
Last written: 12/20/19 Last ov: 11/29/19 Next ov: none Contract: 10/13/18 UDS: 10/13/18- [facility] employee

## 2020-01-29 ENCOUNTER — Other Ambulatory Visit: Payer: Self-pay | Admitting: Family Medicine

## 2020-01-29 DIAGNOSIS — F988 Other specified behavioral and emotional disorders with onset usually occurring in childhood and adolescence: Secondary | ICD-10-CM

## 2020-02-08 ENCOUNTER — Other Ambulatory Visit: Payer: Self-pay | Admitting: Family Medicine

## 2020-02-08 DIAGNOSIS — F419 Anxiety disorder, unspecified: Secondary | ICD-10-CM

## 2020-02-08 MED ORDER — ALPRAZOLAM 0.25 MG PO TABS: 0.2500 mg | ORAL_TABLET | Freq: Two times a day (BID) | ORAL | 0 refills | Status: DC | PRN

## 2020-02-08 NOTE — Telephone Encounter (Signed)
Requesting: Alprazolam Contract:12/18/2019 UDS:10/13/2018 Last Visit:11/29/2019 Next Visit:none Last Refill:01/11/2020  Please Advise

## 2020-02-25 ENCOUNTER — Other Ambulatory Visit: Payer: No Typology Code available for payment source

## 2020-02-25 DIAGNOSIS — Z20822 Contact with and (suspected) exposure to covid-19: Secondary | ICD-10-CM

## 2020-02-26 LAB — NOVEL CORONAVIRUS, NAA: SARS-CoV-2, NAA: DETECTED — AB

## 2020-02-26 LAB — SARS-COV-2, NAA 2 DAY TAT

## 2020-02-27 ENCOUNTER — Telehealth: Payer: Self-pay | Admitting: Family Medicine

## 2020-02-27 NOTE — Telephone Encounter (Signed)
Patient states her covid test came back positive. She wants to know if there is any treatment or options. I offer a virtual appt patient declined

## 2020-02-27 NOTE — Telephone Encounter (Signed)
Please advise in PCP absence.  

## 2020-02-27 NOTE — Telephone Encounter (Signed)
39 year old female, she is morbidly obese, no history of diabetes or hypertension that I can tell.  She had 2 Covid vaccines. She reports now that she is Covid positive. Current protocols are changing, she might qualify for some treatment. Advise patient: Rest, fluids, seek medical attention if severe symptoms. Referred for possible monoclonal treatment.

## 2020-02-27 NOTE — Telephone Encounter (Signed)
LMOM informing Pt of recommendations. She tested positive 12/27, I have sent a message to infusion clinic.

## 2020-02-28 ENCOUNTER — Other Ambulatory Visit: Payer: Self-pay | Admitting: Family Medicine

## 2020-02-28 DIAGNOSIS — F419 Anxiety disorder, unspecified: Secondary | ICD-10-CM

## 2020-02-28 MED ORDER — ALPRAZOLAM 0.25 MG PO TABS: 0.2500 mg | ORAL_TABLET | Freq: Two times a day (BID) | ORAL | 0 refills | Status: DC | PRN

## 2020-02-28 NOTE — Telephone Encounter (Signed)
Requesting: alprazolam 0.25mg  Contract: 11/29/2019 UDS: 10/13/2018 Last Visit: 11/29/2019 Next Visit: None Last Refill: 02/08/2020 #20 and 0RF  Please Advise

## 2020-02-29 ENCOUNTER — Other Ambulatory Visit: Payer: Self-pay | Admitting: Family Medicine

## 2020-02-29 DIAGNOSIS — F988 Other specified behavioral and emotional disorders with onset usually occurring in childhood and adolescence: Secondary | ICD-10-CM

## 2020-03-03 ENCOUNTER — Other Ambulatory Visit: Payer: Self-pay | Admitting: Family Medicine

## 2020-03-03 MED ORDER — AMPHETAMINE-DEXTROAMPHET ER 30 MG PO CP24: 30.0000 mg | ORAL_CAPSULE | Freq: Every day | ORAL | 0 refills | Status: DC

## 2020-03-03 NOTE — Telephone Encounter (Signed)
Requesting: Adderall XR 30mg  Contract: 11/29/2019 UDS: 10/13/2018 Last Visit: 11/29/2019 Next Visit: None Last Refill: 12/31/2019 #30 and 0RF  Please Advise

## 2020-03-03 NOTE — Telephone Encounter (Signed)
.  Requesting: Adderall Contract: 10/13/2018 UDS: 10/13/2018 Last OV: 10/3019 Next OV: N/A Last Refill: 12/31/2019, #30--0 RF Database:   Please advise

## 2020-03-12 ENCOUNTER — Encounter (INDEPENDENT_AMBULATORY_CARE_PROVIDER_SITE_OTHER): Payer: Self-pay

## 2020-03-20 ENCOUNTER — Encounter (INDEPENDENT_AMBULATORY_CARE_PROVIDER_SITE_OTHER): Payer: Self-pay | Admitting: Family Medicine

## 2020-03-20 ENCOUNTER — Ambulatory Visit (INDEPENDENT_AMBULATORY_CARE_PROVIDER_SITE_OTHER): Payer: No Typology Code available for payment source | Admitting: Family Medicine

## 2020-03-20 ENCOUNTER — Other Ambulatory Visit: Payer: Self-pay

## 2020-03-20 VITALS — BP 133/87 | HR 92 | Temp 98.3°F | Ht 67.0 in | Wt 265.0 lb

## 2020-03-20 DIAGNOSIS — G4733 Obstructive sleep apnea (adult) (pediatric): Secondary | ICD-10-CM | POA: Diagnosis not present

## 2020-03-20 DIAGNOSIS — Z9884 Bariatric surgery status: Secondary | ICD-10-CM

## 2020-03-20 DIAGNOSIS — Z9189 Other specified personal risk factors, not elsewhere classified: Secondary | ICD-10-CM | POA: Diagnosis not present

## 2020-03-20 DIAGNOSIS — F17200 Nicotine dependence, unspecified, uncomplicated: Secondary | ICD-10-CM

## 2020-03-20 DIAGNOSIS — Z0289 Encounter for other administrative examinations: Secondary | ICD-10-CM

## 2020-03-20 DIAGNOSIS — G44209 Tension-type headache, unspecified, not intractable: Secondary | ICD-10-CM

## 2020-03-20 DIAGNOSIS — Z1331 Encounter for screening for depression: Secondary | ICD-10-CM | POA: Diagnosis not present

## 2020-03-20 DIAGNOSIS — Z6841 Body Mass Index (BMI) 40.0 and over, adult: Secondary | ICD-10-CM

## 2020-03-20 DIAGNOSIS — R0602 Shortness of breath: Secondary | ICD-10-CM

## 2020-03-20 DIAGNOSIS — F419 Anxiety disorder, unspecified: Secondary | ICD-10-CM

## 2020-03-20 DIAGNOSIS — F32A Depression, unspecified: Secondary | ICD-10-CM

## 2020-03-20 DIAGNOSIS — R5383 Other fatigue: Secondary | ICD-10-CM

## 2020-03-20 DIAGNOSIS — F908 Attention-deficit hyperactivity disorder, other type: Secondary | ICD-10-CM

## 2020-03-21 ENCOUNTER — Other Ambulatory Visit: Payer: Self-pay | Admitting: Family Medicine

## 2020-03-21 ENCOUNTER — Encounter (INDEPENDENT_AMBULATORY_CARE_PROVIDER_SITE_OTHER): Payer: Self-pay | Admitting: Family Medicine

## 2020-03-21 DIAGNOSIS — F419 Anxiety disorder, unspecified: Secondary | ICD-10-CM

## 2020-03-21 LAB — COMPREHENSIVE METABOLIC PANEL
ALT: 13 IU/L (ref 0–32)
AST: 14 IU/L (ref 0–40)
Albumin/Globulin Ratio: 2 (ref 1.2–2.2)
Albumin: 4.5 g/dL (ref 3.8–4.8)
Alkaline Phosphatase: 62 IU/L (ref 44–121)
BUN/Creatinine Ratio: 10 (ref 9–23)
BUN: 9 mg/dL (ref 6–20)
Bilirubin Total: 0.3 mg/dL (ref 0.0–1.2)
CO2: 23 mmol/L (ref 20–29)
Calcium: 9.2 mg/dL (ref 8.7–10.2)
Chloride: 102 mmol/L (ref 96–106)
Creatinine, Ser: 0.87 mg/dL (ref 0.57–1.00)
GFR calc Af Amer: 98 mL/min/{1.73_m2} (ref >59)
GFR calc non Af Amer: 85 mL/min/{1.73_m2} (ref >59)
Globulin, Total: 2.3 g/dL (ref 1.5–4.5)
Glucose: 93 mg/dL (ref 65–99)
Potassium: 4.7 mmol/L (ref 3.5–5.2)
Sodium: 139 mmol/L (ref 134–144)
Total Protein: 6.8 g/dL (ref 6.0–8.5)

## 2020-03-21 LAB — CBC WITH DIFFERENTIAL/PLATELET
Basophils Absolute: 0.1 10*3/uL (ref 0.0–0.2)
Basos: 1 %
EOS (ABSOLUTE): 0.1 10*3/uL (ref 0.0–0.4)
Eos: 2 %
Hemoglobin: 15.2 g/dL (ref 11.1–15.9)
Immature Grans (Abs): 0 10*3/uL (ref 0.0–0.1)
Immature Granulocytes: 0 %
Lymphocytes Absolute: 2 10*3/uL (ref 0.7–3.1)
Lymphs: 24 %
MCH: 30.4 pg (ref 26.6–33.0)
MCHC: 32.9 g/dL (ref 31.5–35.7)
MCV: 92 fL (ref 79–97)
Monocytes Absolute: 0.4 10*3/uL (ref 0.1–0.9)
Monocytes: 5 %
Neutrophils Absolute: 5.9 10*3/uL (ref 1.4–7.0)
Neutrophils: 68 %
Platelets: 354 10*3/uL (ref 150–450)
RBC: 5 x10E6/uL (ref 3.77–5.28)
RDW: 11.8 % (ref 11.7–15.4)
WBC: 8.6 10*3/uL (ref 3.4–10.8)

## 2020-03-21 LAB — ANEMIA PANEL
Ferritin: 58 ng/mL (ref 15–150)
Folate, Hemolysate: 340 ng/mL
Folate, RBC: 736 ng/mL (ref >498)
Hematocrit: 46.2 % (ref 34.0–46.6)
Iron Saturation: 17 % (ref 15–55)
Iron: 57 ug/dL (ref 27–159)
Retic Ct Pct: 1.3 % (ref 0.6–2.6)
Total Iron Binding Capacity: 340 ug/dL (ref 250–450)
UIBC: 283 ug/dL (ref 131–425)
Vitamin B-12: 230 pg/mL — ABNORMAL LOW (ref 232–1245)

## 2020-03-21 LAB — LIPID PANEL WITH LDL/HDL RATIO
Cholesterol, Total: 123 mg/dL (ref 100–199)
HDL: 47 mg/dL (ref >39)
LDL Chol Calc (NIH): 63 mg/dL (ref 0–99)
LDL/HDL Ratio: 1.3 ratio (ref 0.0–3.2)
Triglycerides: 61 mg/dL (ref 0–149)
VLDL Cholesterol Cal: 13 mg/dL (ref 5–40)

## 2020-03-21 LAB — INSULIN, RANDOM: INSULIN: 11.8 u[IU]/mL (ref 2.6–24.9)

## 2020-03-21 LAB — HEMOGLOBIN A1C
Est. average glucose Bld gHb Est-mCnc: 97 mg/dL
Hgb A1c MFr Bld: 5 % (ref 4.8–5.6)

## 2020-03-21 LAB — TSH: TSH: 1.6 u[IU]/mL (ref 0.450–4.500)

## 2020-03-21 LAB — T4, FREE: Free T4: 1.24 ng/dL (ref 0.82–1.77)

## 2020-03-21 LAB — VITAMIN D 25 HYDROXY (VIT D DEFICIENCY, FRACTURES): Vit D, 25-Hydroxy: 23.8 ng/mL — ABNORMAL LOW (ref 30.0–100.0)

## 2020-03-21 MED ORDER — ALPRAZOLAM 0.25 MG PO TABS: 0.2500 mg | ORAL_TABLET | Freq: Two times a day (BID) | ORAL | 0 refills | Status: DC | PRN

## 2020-03-21 NOTE — Telephone Encounter (Signed)
Requesting: Alprazolam Contract: 10/13/2018 UDS: 10/13/2018 Last Visit: 11/29/2019 Next Visit: none Last Refill: 02/28/2020  Please Advise

## 2020-03-24 NOTE — Progress Notes (Addendum)
Dear Dr. Etter Sjogren,   Thank you for referring Marisa Humphrey to our clinic. The following note includes my evaluation and treatment recommendations.  Chief Complaint:   OBESITY Marisa Humphrey (MR# [id number]) is a 40 y.o. female who presents for evaluation and treatment of obesity and related comorbidities. Current BMI is Body mass index is 41.5 kg/m. Marisa Humphrey has been struggling with her weight for many years and has been unsuccessful in either losing weight, maintaining weight loss, or reaching her healthy weight goal.  Marisa Humphrey is currently in the action stage of change and ready to dedicate time achieving and maintaining a healthier weight. Marisa Humphrey is interested in becoming our patient and working on intensive lifestyle modifications including (but not limited to) diet and exercise for weight loss.  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  She is married, but is wanting to separate.  She lives with her husband and 2 daughters (25, 24).  Marisa Humphrey provided the following food recall today:  Breakfast:  Skips. Lunch:   Dinner:  8. Cookie in the middle of the night.  Marisa Humphrey's habits were reviewed today and are as follows: Her family eats meals together, she thinks her family will eat healthier with her, her desired weight loss is 115 pounds, she has been heavy most of her life, she started gaining weight in middle school, her heaviest weight ever was 311 pounds, she craves carbs and sweets, she snacks in the middle of the night, she skips breakfast everyday and sometimes lunch, she is frequently drinking liquids with calories, she frequently makes poor food choices, she frequently eats larger portions than normal and she struggles with emotional eating.  Depression Screen Marisa Humphrey's Food and Mood (modified PHQ-9) score was 18.  Depression screen Marisa Humphrey 2/9 03/20/2020  Decreased Interest 3  Down, Depressed, Hopeless 3   PHQ - 2 Score 6  Altered sleeping 2  Tired, decreased energy 3  Change in appetite 1  Feeling bad or failure about yourself  1  Trouble concentrating 3  Moving slowly or fidgety/restless 2  Suicidal thoughts 0  PHQ-9 Score 18  Difficult doing work/chores -   Assessment/Plan:   1. Other fatigue Marisa Humphrey admits to daytime somnolence and reports waking up still tired. Patent has a history of symptoms of daytime fatigue, morning fatigue and snoring. Marisa Humphrey generally gets 6-8 hours of sleep per night, and states that she has generally restful sleep. Snoring is present. Apneic episodes are present. Epworth Sleepiness Score is 9.  Marisa Humphrey does feel that her weight is causing her energy to be lower than it should be. Fatigue may be related to obesity, depression or many other causes. Labs will be ordered, and in the meanwhile, Marisa Humphrey will focus on self care including making healthy food choices, increasing physical activity and focusing on stress reduction.  - EKG 12-Lead - CBC with Differential/Platelet - Comprehensive metabolic panel - Hemoglobin A1c - Insulin, random - Lipid Panel With LDL/HDL Ratio - T4, free - TSH - VITAMIN D 25 Hydroxy (Vit-D Deficiency, Fractures) - Anemia panel  2. SOB (shortness of breath) on exertion Marisa Humphrey notes increasing shortness of breath with exercising and seems to be worsening over time with weight gain. She notes getting out of breath sooner with activity than she used to. This has gotten worse recently. Marisa Humphrey denies shortness of breath at rest or orthopnea.  Marisa Humphrey does  feel that she gets out of breath more easily that she used to when she exercises. Marisa Humphrey's shortness of breath appears to be obesity related and exercise induced. She has agreed to work on weight loss and gradually increase exercise to treat her exercise induced shortness of breath. Will continue to monitor closely.  - Lipid Panel With  LDL/HDL Ratio  3. Tension headache Marisa Humphrey takes Advil, if needed, for tension headaches. We will continue to monitor symptoms as they relate to her weight loss journey.  4. OSA (obstructive sleep apnea), mild Mild, prior to lap band.  OSA is a cause of systemic hypertension and is associated with an increased incidence of stroke, heart failure, atrial fibrillation, and coronary heart disease. Severe OSA increases all-cause mortality and  cardiovascular mortality.  Epworth score is 9.  Goal: Treatment of OSA via CPAP compliance and weight loss. . Plasma ghrelin levels (appetite or "hunger hormone") are significantly higher in OSA patients than in BMI-matched controls, but decrease to levels similar to those of obese patients without OSA after CPAP treatment.  . Weight loss improves OSA by several mechanisms, including reduction in fatty tissue in the throat (i.e. parapharyngeal fat) and the tongue. Loss of abdominal fat increases mediastinal traction on the upper airway making it less likely to collapse during sleep. . Studies have also shown that compliance with CPAP treatment improves leptin (hunger inhibitory hormone) imbalance.  5. History of laparoscopic adjustable gastric banding In 2006 with Dr. Hassell Done.  Marisa Humphrey is at risk for malnutrition due to her previous bariatric surgery.   Counseling  You may need to eat 3 meals and 2 snacks, or 5 small meals each day in order to reach your protein and calorie goals.   Allow at least 15 minutes for each meal so that you can eat mindfully. Listen to your body so that you do not overeat. For most people, your sleeve or pouch will comfortably hold 4-6 ounces.  Eat foods from all food groups. This includes fruits and vegetables, grains, dairy, and meat and other proteins.  Include a protein-rich food at every meal and snack, and eat the protein food first.   You should be taking a Bariatric Multivitamin as well as calcium.   6. Tobacco  use disorder She is smoking 4 cigarettes per day. Plan: counseled patient on the dangers of tobacco use, advised patient to stop smoking, and reviewed strategies to maximize success.  7. Attention deficit hyperactivity disorder (ADHD) Megan Presti is taking Adderall XR 30 mg daily.  8. Anxiety and depression, with emotional eating With BED.  She takes Xanax 0.25 mg twice daily as needed for anxiety.  She has a history of Wellbutrin 300 mg and Zoloft 100 mg use - take 1/2 for 1 month.  PHQ-9 is 18.  9. At risk for heart disease Due to [place] current state of health and medical condition(s), she is at a higher risk for heart disease.   This puts the patient at much greater risk to subsequently develop cardiopulmonary conditions that can significantly affect patient's quality of life in a negative manner as well.    At least 9 minutes was spent on counseling Aziah Kaiser about these concerns today. Initial goal is to lose at least 5-10% of starting weight to help reduce these risk factors.  We will continue to reassess these conditions on a fairly regular basis in an attempt to decrease patient's overall morbidity and mortality.  Evidence-based interventions for health behavior change  were utilized today including the discussion of self monitoring techniques, problem-solving barriers and SMART goal setting techniques.  Specifically regarding patient's less desirable eating habits and patterns, we employed the technique of small changes when Davi Rotan has not been able to fully commit to her prudent nutritional plan.  10. Class 3 severe obesity with serious comorbidity and body mass index (BMI) of 40.0 to 44.9 in adult, unspecified obesity type [facility])  Dee Paden is currently in the action stage of change and her goal is to continue with weight loss efforts. I recommend Margeart Allender begin the structured treatment plan as follows:  She has agreed to the Category 1 Plan +500  calories.  Exercise goals: No exercise has been prescribed at this time.   Behavioral modification strategies: increasing lean protein intake, decreasing simple carbohydrates, increasing vegetables, increasing water intake, decreasing sodium intake and increasing high fiber foods.  She was informed of the importance of frequent follow-up visits to maximize her success with intensive lifestyle modifications for her multiple health conditions. She was informed we would discuss her lab results at her next visit unless there is a critical issue that needs to be addressed sooner. Marisa Humphrey agreed to keep her next visit at the agreed upon time to discuss these results.  Objective:   Blood pressure 133/87, pulse 92, temperature 98.3 F (36.8 C), temperature source Oral, height 5\' 7"  (1.702 m), weight 265 lb (120.2 kg), SpO2 100 %. Body mass index is 41.5 kg/m.  EKG: Normal sinus rhythm, rate 83 bpm.  Indirect Calorimeter completed today shows a VO2 of 325 and a REE of 2262.  Her calculated basal metabolic rate is 2536 thus her basal metabolic rate is better than expected.  General: Cooperative, alert, well developed, in no acute distress. HEENT: Conjunctivae and lids unremarkable. Cardiovascular: Regular rhythm.  Lungs: Normal work of breathing. Neurologic: No focal deficits.   Lab Results  Component Value Date   CREATININE 0.87 03/20/2020   BUN 9 03/20/2020   NA 139 03/20/2020   K 4.7 03/20/2020   CL 102 03/20/2020   CO2 23 03/20/2020   Lab Results  Component Value Date   ALT 13 03/20/2020   AST 14 03/20/2020   ALKPHOS 62 03/20/2020   BILITOT 0.3 03/20/2020   Lab Results  Component Value Date   HGBA1C 5.0 03/20/2020   Lab Results  Component Value Date   INSULIN 11.8 03/20/2020   Lab Results  Component Value Date   TSH 1.600 03/20/2020   Lab Results  Component Value Date   CHOL 123 03/20/2020   HDL 47 03/20/2020   LDLCALC 63 03/20/2020   TRIG 61 03/20/2020    CHOLHDL 2.9 11/29/2019   Lab Results  Component Value Date   WBC 8.6 03/20/2020   HGB 15.2 03/20/2020   HCT 46.2 03/20/2020   MCV 92 03/20/2020   PLT 354 03/20/2020   Lab Results  Component Value Date   IRON 57 03/20/2020   TIBC 340 03/20/2020   FERRITIN 58 03/20/2020   Attestation Statements:   Reviewed by clinician on day of visit: allergies, medications, problem list, medical history, surgical history, family history, social history, and previous encounter notes.  This is the patient's first visit at [facility]. The patient's NEW PATIENT PACKET was reviewed at length. Included in the packet: current and past health history, medications, allergies, ROS, gynecologic history (women only), surgical history, family history, social history, weight history, weight loss surgery history (for those that have  had weight loss surgery), nutritional evaluation, mood and food questionnaire, PHQ9, Epworth questionnaire, sleep habits questionnaire, patient life and health improvement goals questionnaire. These will all be scanned into the patient's chart under media.   During the visit, I independently reviewed the patient's EKG, bioimpedance scale results, and indirect calorimeter results. I used this information to tailor a meal plan for the patient that will help her to lose weight and will improve her obesity-related conditions going forward. I performed a medically necessary appropriate examination and/or evaluation. I discussed the assessment and treatment plan with the patient. The patient was provided an opportunity to ask questions and all were answered. The patient agreed with the plan and demonstrated an understanding of the instructions. Labs were ordered at this visit and will be reviewed at the next visit unless more critical results need to be addressed immediately. Clinical information was updated and documented in the EMR.   I, Water quality scientist, CMA, am acting as  transcriptionist for Briscoe Deutscher, DO  I have reviewed the above documentation for accuracy and completeness, and I agree with the above. Briscoe Deutscher, DO

## 2020-03-24 NOTE — Telephone Encounter (Signed)
Last OV with Dr Wallace 

## 2020-03-25 ENCOUNTER — Other Ambulatory Visit (INDEPENDENT_AMBULATORY_CARE_PROVIDER_SITE_OTHER): Payer: Self-pay | Admitting: Family Medicine

## 2020-03-25 MED ORDER — BUPROPION HCL ER (XL) 150 MG PO TB24: 150.0000 mg | ORAL_TABLET | Freq: Every day | ORAL | 0 refills | Status: DC

## 2020-04-01 ENCOUNTER — Other Ambulatory Visit: Payer: Self-pay | Admitting: Family Medicine

## 2020-04-01 DIAGNOSIS — F988 Other specified behavioral and emotional disorders with onset usually occurring in childhood and adolescence: Secondary | ICD-10-CM

## 2020-04-01 MED ORDER — AMPHETAMINE-DEXTROAMPHET ER 30 MG PO CP24: 30.0000 mg | ORAL_CAPSULE | Freq: Every day | ORAL | 0 refills | Status: DC

## 2020-04-01 NOTE — Telephone Encounter (Signed)
Requesting: Adderall XR 30mg  Contract: 10/13/2018 UDS:  11/29/2019 Last Visit: 11/29/2019 Next Visit: None Last Refill: 03/03/2020 #30 and 0RF  Please Advise

## 2020-04-03 ENCOUNTER — Other Ambulatory Visit (HOSPITAL_COMMUNITY): Payer: Self-pay | Admitting: Family Medicine

## 2020-04-03 ENCOUNTER — Other Ambulatory Visit: Payer: Self-pay

## 2020-04-03 ENCOUNTER — Encounter (INDEPENDENT_AMBULATORY_CARE_PROVIDER_SITE_OTHER): Payer: Self-pay | Admitting: Family Medicine

## 2020-04-03 ENCOUNTER — Ambulatory Visit (INDEPENDENT_AMBULATORY_CARE_PROVIDER_SITE_OTHER): Payer: No Typology Code available for payment source | Admitting: Family Medicine

## 2020-04-03 VITALS — BP 131/88 | HR 75 | Temp 98.5°F | Ht 67.0 in | Wt 264.0 lb

## 2020-04-03 DIAGNOSIS — F419 Anxiety disorder, unspecified: Secondary | ICD-10-CM

## 2020-04-03 DIAGNOSIS — Z9189 Other specified personal risk factors, not elsewhere classified: Secondary | ICD-10-CM

## 2020-04-03 DIAGNOSIS — F908 Attention-deficit hyperactivity disorder, other type: Secondary | ICD-10-CM

## 2020-04-03 DIAGNOSIS — E538 Deficiency of other specified B group vitamins: Secondary | ICD-10-CM | POA: Diagnosis not present

## 2020-04-03 DIAGNOSIS — G4709 Other insomnia: Secondary | ICD-10-CM

## 2020-04-03 DIAGNOSIS — E559 Vitamin D deficiency, unspecified: Secondary | ICD-10-CM

## 2020-04-03 DIAGNOSIS — Z9884 Bariatric surgery status: Secondary | ICD-10-CM

## 2020-04-03 DIAGNOSIS — F17200 Nicotine dependence, unspecified, uncomplicated: Secondary | ICD-10-CM

## 2020-04-03 DIAGNOSIS — Z6841 Body Mass Index (BMI) 40.0 and over, adult: Secondary | ICD-10-CM

## 2020-04-03 MED ORDER — VITAMIN D (ERGOCALCIFEROL) 1.25 MG (50000 UNIT) PO CAPS: 50000.0000 [IU] | ORAL_CAPSULE | ORAL | 0 refills | Status: DC

## 2020-04-03 MED ORDER — SYRINGE/NEEDLE (DISP) 27G X 5/8" 1 ML MISC
0 refills | Status: DC
Start: 2020-04-03 — End: 2020-07-02

## 2020-04-03 MED ORDER — TRAZODONE HCL 50 MG PO TABS
25.0000 mg | ORAL_TABLET | Freq: Every day | ORAL | 0 refills | Status: DC
Start: 2020-04-03 — End: 2020-05-21

## 2020-04-03 MED ORDER — LISDEXAMFETAMINE DIMESYLATE 40 MG PO CAPS: 40.0000 mg | ORAL_CAPSULE | ORAL | 0 refills | Status: DC

## 2020-04-03 MED ORDER — CYANOCOBALAMIN 1000 MCG/ML IJ SOLN: 1000.0000 ug | INTRAMUSCULAR | 0 refills | Status: DC

## 2020-04-08 NOTE — Progress Notes (Signed)
Chief Complaint:   OBESITY Marisa Humphrey is here to discuss her progress with her obesity treatment plan along with follow-up of her obesity related diagnoses.   Today's visit was #: 2 Starting weight: 265 lbs Starting date: 03/20/2020 Today's weight: 264 lbs Today's date: 04/03/2020 Total lbs lost to date: 1 lb Body mass index is 41.35 kg/m.  Total weight loss percentage to date: -0.38%  Interim History:  Marisa Humphrey says she has insomnia.   Nutrition Plan: Category 1 plan +500 calories. Activity: None at this time.  Assessment/Plan:   1. B12 deficiency Lab Results  Component Value Date   VITAMINB12 230 (L) 03/20/2020   Supplementation: None. Continue current treatment recheck her B12 level with the next set of labs.    Plan:  Will start vitamin B12 supplementation, as per below.  - Start cyanocobalamin (,VITAMIN B-12,) 1000 MCG/ML injection; Inject 1 mL (1,000 mcg total) into the muscle every 14 (fourteen) days.  Dispense: 10 mL; Refill: 0 - Syringe/Needle, Disp, 27G X 5/8" 1 ML MISC; Use to give B12 injections  Dispense: 50 each; Refill: 0  2. Vitamin D deficiency Not at goal. Current vitamin D is 23.8, tested on 03/20/2020. Optimal goal > 50 ng/dL.   Plan: Continue to take prescription Vitamin D @50 ,000 IU every week as prescribed.  Follow-up for routine testing of Vitamin D, at least 2-3 times per year to avoid over-replacement.  - Start Vitamin D, Ergocalciferol, (DRISDOL) 1.25 MG (50000 UNIT) CAPS capsule; Take 1 capsule (50,000 Units total) by mouth every 7 (seven) days.  Dispense: 4 capsule; Refill: 0  3. History of laparoscopic adjustable gastric banding Marisa Humphrey had lap band surgery in 2007 with Dr. Hassell Done.  4. Tobacco use disorder Course: Uncontrolled. Plan: counseled patient on the dangers of tobacco use, advised patient to stop smoking, and reviewed strategies to maximize success.  5. Other insomnia This is poorly controlled.  Current treatment: None.  Plan:  Recommend sleep hygiene measures including regular sleep schedule, optimal sleep environment, and relaxing presleep rituals.  We will continue to monitor this patient's condition along with her PCP.  Will start trazodone, as per below.  - Start traZODone (DESYREL) 50 MG tablet; Take 0.5-1 tablets (25-50 mg total) by mouth at bedtime.  Dispense: 30 tablet; Refill: 0  6. Attention deficit hyperactivity disorder (ADHD), other type Marisa Humphrey has tried Adderall in the past.    Plan:  Will start Vyvanse 40 mg daily for ADHD.  - Start lisdexamfetamine (VYVANSE) 40 MG capsule; Take 1 capsule (40 mg total) by mouth every morning.  Dispense: 30 capsule; Refill: 0  7. Anxiety, with emotional eating Behavior modification techniques were discussed today to help Marisa Humphrey deal with her anxiety.  She is taking Wellbutrin XL 150 mg daily and Zoloft 50 mg daily.  Recommend she begin seeing a counselor.  8. At risk for heart disease Due to Marisa Humphrey current state of health and medical condition(s), she is at a higher risk for heart disease.  This puts the patient at much greater risk to subsequently develop cardiopulmonary conditions that can significantly affect patient's quality of life in a negative manner.    At least 10 minutes were spent on counseling Marisa Humphrey about these concerns today, and I stressed the importance of reversing risks factors of obesity, especially truncal and visceral fat, hypertension, hyperlipidemia, and pre-diabetes.  The initial goal is to lose at least 5-10% of starting weight to help reduce these risk factors.  Counseling:  Intensive lifestyle modifications were discussed with Marisa Humphrey as the most appropriate first line of treatment.  she will continue to work on diet, exercise, and weight loss efforts.  We will continue to reassess these conditions on a fairly regular basis in an attempt to decrease the patient's overall morbidity and mortality.  Evidence-based  interventions for health behavior change were utilized today including the discussion of self monitoring techniques, problem-solving barriers, and SMART goal setting techniques.  Specifically, regarding patient's less desirable eating habits and patterns, we employed the technique of small changes when Marisa Humphrey has not been able to fully commit to her prudent nutritional plan.  9. Class 3 severe obesity with serious comorbidity and body mass index (BMI) of 40.0 to 44.9 in adult, unspecified obesity type [name])  Course: Marisa Humphrey is currently in the action stage of change. As such, her goal is to continue with weight loss efforts.   Nutrition goals: She has agreed to journal 2 protein shakes and dinner daily and drink 64 ounces of water daily.   Exercise goals: No exercise has been prescribed at this time.  Behavioral modification strategies: increasing lean protein intake, decreasing simple carbohydrates, increasing vegetables, increasing water intake and decreasing liquid calories.  Marisa Humphrey has agreed to follow-up with our clinic in 2-3 weeks. She was informed of the importance of frequent follow-up visits to maximize her success with intensive lifestyle modifications for her multiple health conditions.   Objective:   Blood pressure 131/88, pulse 75, temperature 98.5 F (36.9 C), temperature source Oral, height 5\' 7"  (1.702 m), weight 264 lb (119.7 kg), SpO2 99 %. Body mass index is 41.35 kg/m.  General: Cooperative, alert, well developed, in no acute distress. HEENT: Conjunctivae and lids unremarkable. Cardiovascular: Regular rhythm.  Lungs: Normal work of breathing. Neurologic: No focal deficits.   Lab Results  Component Value Date   CREATININE 0.87 03/20/2020   BUN 9 03/20/2020   NA 139 03/20/2020   K 4.7 03/20/2020   CL 102 03/20/2020   CO2 23 03/20/2020   Lab Results  Component Value Date   ALT 13 03/20/2020   AST 14 03/20/2020   ALKPHOS 62 03/20/2020    BILITOT 0.3 03/20/2020   Lab Results  Component Value Date   HGBA1C 5.0 03/20/2020   Lab Results  Component Value Date   INSULIN 11.8 03/20/2020   Lab Results  Component Value Date   TSH 1.600 03/20/2020   Lab Results  Component Value Date   CHOL 123 03/20/2020   HDL 47 03/20/2020   LDLCALC 63 03/20/2020   TRIG 61 03/20/2020   CHOLHDL 2.9 11/29/2019   Lab Results  Component Value Date   WBC 8.6 03/20/2020   HGB 15.2 03/20/2020   HCT 46.2 03/20/2020   MCV 92 03/20/2020   PLT 354 03/20/2020   Lab Results  Component Value Date   IRON 57 03/20/2020   TIBC 340 03/20/2020   FERRITIN 58 03/20/2020   Attestation Statements:   Reviewed by clinician on day of visit: allergies, medications, problem list, medical history, surgical history, family history, social history, and previous encounter notes.  I, Water quality scientist, CMA, am acting as transcriptionist for Briscoe Deutscher, DO  I have reviewed the above documentation for accuracy and completeness, and I agree with the above. Briscoe Deutscher, DO

## 2020-04-16 ENCOUNTER — Other Ambulatory Visit: Payer: Self-pay | Admitting: Family Medicine

## 2020-04-16 DIAGNOSIS — F419 Anxiety disorder, unspecified: Secondary | ICD-10-CM

## 2020-04-16 MED ORDER — ALPRAZOLAM 0.25 MG PO TABS: 0.2500 mg | ORAL_TABLET | Freq: Two times a day (BID) | ORAL | 0 refills | Status: DC | PRN

## 2020-04-16 NOTE — Telephone Encounter (Signed)
Need contract and uds on pt

## 2020-04-16 NOTE — Telephone Encounter (Signed)
Requesting: Xanax  Contract: 10/13/2018 UDS: 10/13/2018 Last OV: 11/29/2019 Next OV: N/A Last Refill: 03/21/2020, #20--0 RF Database:   Please advise

## 2020-04-27 ENCOUNTER — Encounter (INDEPENDENT_AMBULATORY_CARE_PROVIDER_SITE_OTHER): Payer: Self-pay | Admitting: Family Medicine

## 2020-05-01 ENCOUNTER — Ambulatory Visit (INDEPENDENT_AMBULATORY_CARE_PROVIDER_SITE_OTHER): Payer: No Typology Code available for payment source | Admitting: Family Medicine

## 2020-05-08 ENCOUNTER — Other Ambulatory Visit: Payer: Self-pay | Admitting: Family Medicine

## 2020-05-08 DIAGNOSIS — F419 Anxiety disorder, unspecified: Secondary | ICD-10-CM

## 2020-05-08 MED ORDER — ALPRAZOLAM 0.25 MG PO TABS: 0.2500 mg | ORAL_TABLET | Freq: Two times a day (BID) | ORAL | 0 refills | Status: DC | PRN

## 2020-05-08 NOTE — Telephone Encounter (Signed)
Requesting: Xanax Contract: 10/13/2018 UDS: 10/13/2018, low risk Last OV: 11/29/2019 Next OV: N/A Last Refill: 04/16/2020, #20--0 RF Database:   Please advise

## 2020-05-20 ENCOUNTER — Encounter (INDEPENDENT_AMBULATORY_CARE_PROVIDER_SITE_OTHER): Payer: Self-pay | Admitting: Family Medicine

## 2020-05-20 DIAGNOSIS — F908 Attention-deficit hyperactivity disorder, other type: Secondary | ICD-10-CM

## 2020-05-20 DIAGNOSIS — E559 Vitamin D deficiency, unspecified: Secondary | ICD-10-CM

## 2020-05-20 DIAGNOSIS — G4709 Other insomnia: Secondary | ICD-10-CM

## 2020-05-22 ENCOUNTER — Other Ambulatory Visit (INDEPENDENT_AMBULATORY_CARE_PROVIDER_SITE_OTHER): Payer: Self-pay | Admitting: Family Medicine

## 2020-05-22 MED ORDER — LISDEXAMFETAMINE DIMESYLATE 40 MG PO CAPS: 40.0000 mg | ORAL_CAPSULE | ORAL | 0 refills | Status: DC

## 2020-05-22 MED ORDER — TRAZODONE HCL 50 MG PO TABS: 25.0000 mg | ORAL_TABLET | Freq: Every day | ORAL | 0 refills | Status: DC

## 2020-05-22 MED ORDER — VITAMIN D (ERGOCALCIFEROL) 1.25 MG (50000 UNIT) PO CAPS
50000.0000 [IU] | ORAL_CAPSULE | ORAL | 0 refills | Status: DC
Start: 2020-05-22 — End: 2020-05-22

## 2020-05-30 ENCOUNTER — Other Ambulatory Visit: Payer: Self-pay | Admitting: Family Medicine

## 2020-05-30 DIAGNOSIS — F419 Anxiety disorder, unspecified: Secondary | ICD-10-CM

## 2020-06-02 MED ORDER — ALPRAZOLAM 0.25 MG PO TABS: 0.2500 mg | ORAL_TABLET | Freq: Two times a day (BID) | ORAL | 0 refills | Status: DC | PRN

## 2020-06-02 NOTE — Telephone Encounter (Signed)
Requesting: alprazolam 0.25mg  Contract: 11/29/2019 UDS: 10/13/2018 Last Visit: 11/29/2019 Next Visit: None Last Refill: 05/08/2020 #20 and 0RF  Please Advise

## 2020-06-16 ENCOUNTER — Telehealth: Payer: Self-pay

## 2020-06-16 NOTE — Telephone Encounter (Signed)
Yes--- virtual visit

## 2020-06-16 NOTE — Telephone Encounter (Signed)
Initial Comment Caller is on day 6 after testing positive for Covid, has been improving. However, now concerned she has pink eye, sx include eye redness and irritation. Translation No Nurse Assessment Nurse: Vallery Sa, RN, Cathy Date/Time (Eastern Time): 06/13/2020 9:26:16 AM Confirm and document reason for call. If symptomatic, describe symptoms. ---Marisa Humphrey states she developed drainage, redness and irritation of her right eye this morning. No fever today. Alert and responsive. Does the patient have any new or worsening symptoms? ---Yes Will a triage be completed? ---Yes Related visit to physician within the last 2 weeks? ---Yes Does the PT have any chronic conditions? (i.e. diabetes, asthma, this includes High risk factors for pregnancy, etc.) ---Yes List chronic conditions. ---COVID recently (improving)

## 2020-06-16 NOTE — Telephone Encounter (Signed)
Pt  is on day 6 after testing positive for Covid, has been improving. However, now concerned she has pink eye, sx include eye redness and irritation.  Is this a sxs of covid? Should she have a MyChart visit with someone?

## 2020-06-17 NOTE — Telephone Encounter (Signed)
FYI: Pt says on call nurse sent over a refill of the eye drops that apparently had a standing order. Sxs are better.

## 2020-06-18 ENCOUNTER — Other Ambulatory Visit (INDEPENDENT_AMBULATORY_CARE_PROVIDER_SITE_OTHER): Payer: Self-pay | Admitting: Family Medicine

## 2020-06-18 DIAGNOSIS — E559 Vitamin D deficiency, unspecified: Secondary | ICD-10-CM

## 2020-06-18 DIAGNOSIS — F908 Attention-deficit hyperactivity disorder, other type: Secondary | ICD-10-CM

## 2020-06-18 MED FILL — Bupropion HCl Tab ER 24HR 150 MG: ORAL | 60 days supply | Qty: 60 | Fill #0 | Status: AC

## 2020-06-19 ENCOUNTER — Other Ambulatory Visit (HOSPITAL_COMMUNITY): Payer: Self-pay

## 2020-06-19 NOTE — Telephone Encounter (Signed)
Pt last seen by Dr. Wallace.  

## 2020-06-24 ENCOUNTER — Other Ambulatory Visit (HOSPITAL_COMMUNITY): Payer: Self-pay

## 2020-06-24 MED ORDER — LISDEXAMFETAMINE DIMESYLATE 40 MG PO CAPS
ORAL_CAPSULE | ORAL | 0 refills | Status: DC
  Filled 2020-06-24: qty 30, 30d supply, fill #0

## 2020-06-24 MED ORDER — VITAMIN D (ERGOCALCIFEROL) 1.25 MG (50000 UNIT) PO CAPS
ORAL_CAPSULE | ORAL | 0 refills | Status: DC
Start: 2020-06-24 — End: 2020-07-02
  Filled 2020-06-24: qty 4, 28d supply, fill #0

## 2020-06-25 ENCOUNTER — Other Ambulatory Visit (HOSPITAL_COMMUNITY): Payer: Self-pay

## 2020-06-25 MED ORDER — CARESTART COVID-19 HOME TEST VI KIT
PACK | 0 refills | Status: DC
  Filled 2020-06-25: qty 4, 4d supply, fill #0

## 2020-06-26 ENCOUNTER — Other Ambulatory Visit: Payer: Self-pay | Admitting: Family Medicine

## 2020-06-26 DIAGNOSIS — F419 Anxiety disorder, unspecified: Secondary | ICD-10-CM

## 2020-06-27 MED ORDER — ALPRAZOLAM 0.25 MG PO TABS: 0.2500 mg | ORAL_TABLET | Freq: Two times a day (BID) | ORAL | 0 refills | Status: DC | PRN

## 2020-06-27 NOTE — Telephone Encounter (Signed)
Requesting: alprazolam Contract: 10/13/18 UDS: [employer] employee Last Visit: 11/29/19 Next Visit: n/a Last Refill: 06/02/20  Please Advise

## 2020-07-02 ENCOUNTER — Encounter (INDEPENDENT_AMBULATORY_CARE_PROVIDER_SITE_OTHER): Payer: Self-pay | Admitting: Family Medicine

## 2020-07-02 ENCOUNTER — Ambulatory Visit (INDEPENDENT_AMBULATORY_CARE_PROVIDER_SITE_OTHER): Payer: No Typology Code available for payment source | Admitting: Family Medicine

## 2020-07-02 ENCOUNTER — Other Ambulatory Visit: Payer: Self-pay

## 2020-07-02 VITALS — BP 138/83 | HR 80 | Temp 98.6°F | Ht 67.0 in | Wt 271.0 lb

## 2020-07-02 DIAGNOSIS — F3289 Other specified depressive episodes: Secondary | ICD-10-CM

## 2020-07-02 DIAGNOSIS — E559 Vitamin D deficiency, unspecified: Secondary | ICD-10-CM

## 2020-07-02 DIAGNOSIS — Z9189 Other specified personal risk factors, not elsewhere classified: Secondary | ICD-10-CM | POA: Diagnosis not present

## 2020-07-02 DIAGNOSIS — M25561 Pain in right knee: Secondary | ICD-10-CM

## 2020-07-02 DIAGNOSIS — M25571 Pain in right ankle and joints of right foot: Secondary | ICD-10-CM

## 2020-07-02 DIAGNOSIS — E538 Deficiency of other specified B group vitamins: Secondary | ICD-10-CM | POA: Diagnosis not present

## 2020-07-02 DIAGNOSIS — M25572 Pain in left ankle and joints of left foot: Secondary | ICD-10-CM

## 2020-07-02 DIAGNOSIS — Z6841 Body Mass Index (BMI) 40.0 and over, adult: Secondary | ICD-10-CM

## 2020-07-02 DIAGNOSIS — F908 Attention-deficit hyperactivity disorder, other type: Secondary | ICD-10-CM

## 2020-07-03 ENCOUNTER — Other Ambulatory Visit (HOSPITAL_COMMUNITY): Payer: Self-pay

## 2020-07-03 MED ORDER — VITAMIN D (ERGOCALCIFEROL) 1.25 MG (50000 UNIT) PO CAPS
ORAL_CAPSULE | ORAL | 0 refills | Status: DC
  Filled 2020-07-03 – 2020-07-18 (×2): qty 4, 28d supply, fill #0

## 2020-07-03 MED ORDER — LISDEXAMFETAMINE DIMESYLATE 60 MG PO CAPS
60.0000 mg | ORAL_CAPSULE | ORAL | 0 refills | Status: DC
  Filled 2020-07-03 – 2020-07-18 (×2): qty 30, 30d supply, fill #0

## 2020-07-03 MED ORDER — CYANOCOBALAMIN 1000 MCG/ML IJ SOLN
INTRAMUSCULAR | 0 refills | Status: DC
Start: 2020-07-03 — End: 2021-04-16
  Filled 2020-07-03: qty 6, 84d supply, fill #0

## 2020-07-03 MED ORDER — SERTRALINE HCL 50 MG PO TABS
50.0000 mg | ORAL_TABLET | Freq: Every day | ORAL | 0 refills | Status: DC
  Filled 2020-07-03: qty 30, 30d supply, fill #0

## 2020-07-03 MED ORDER — SYRINGE/NEEDLE (DISP) 27G X 5/8" 1 ML MISC
0 refills | Status: DC
Start: 2020-07-03 — End: 2021-12-29
  Filled 2020-07-03: qty 50, fill #0

## 2020-07-03 MED ORDER — MELOXICAM 15 MG PO TABS
15.0000 mg | ORAL_TABLET | Freq: Every day | ORAL | 0 refills | Status: DC
  Filled 2020-07-03: qty 30, 30d supply, fill #0

## 2020-07-03 MED ORDER — BUPROPION HCL ER (XL) 300 MG PO TB24
300.0000 mg | ORAL_TABLET | Freq: Every day | ORAL | 0 refills | Status: DC
  Filled 2020-07-03: qty 90, 90d supply, fill #0

## 2020-07-03 MED FILL — Tuberculin/Allergy Syringe/Needle (Disp) 1 ML 27 x 1/2": 90 days supply | Qty: 10 | Fill #0 | Status: AC

## 2020-07-04 ENCOUNTER — Other Ambulatory Visit (HOSPITAL_COMMUNITY): Payer: Self-pay

## 2020-07-08 NOTE — Progress Notes (Signed)
Chief Complaint:   OBESITY Marisa Humphrey is here to discuss her progress with her obesity treatment plan along with follow-up of her obesity related diagnoses.   Today's visit was #: 3 Starting weight: 265 lbs Starting date: 03/20/2020 Today's weight: 271 lbs Today's date: 07/02/2020 Total lbs lost to date: +6 lbs Body mass index is 42.44 kg/m.   Interim History:  Marisa Humphrey says that her work stress is improving.  She still is under a lot of stress, however.  She has new pain in bilateral ankles and her right knee, which prohibits walking.    Plan:  Check labs at next visit.  Current Meal Plan: the Category 2 Plan for 15% of the time.  Current Exercise Plan: Walking for 30-45 minutes 2 times per week.  Assessment/Plan:   1. Bilateral ankle pain Consistent with PF +/- Achilles tendonitis. We will continue to monitor symptoms as they relate to her weight loss journey.  Plan:  Start meloxicam 15 mg daily.  - Start meloxicam (MOBIC) 15 MG tablet; Take 1 tablet (15 mg total) by mouth daily.  Dispense: 30 tablet; Refill: 0  2. Right knee pain, chronic Chronic, but worsening. Plan:  Will place referral to Sports Medicine today.  - Ambulatory referral to Sports Medicine  3. B12 deficiency Lab Results  Component Value Date   VITAMINB12 230 (L) 03/20/2020   Supplementation: Vitamin B12 1000 mcg/mL IM every 14 days.   Plan:  Refill vitamin B12 today, as per below.   - Refill cyanocobalamin (,VITAMIN B-12,) 1000 MCG/ML injection; INJECT 1 ML (1,000 MCG TOTAL) INTO THE MUSCLE EVERY 14 (FOURTEEN) DAYS.  Dispense: 10 mL; Refill: 0 - Syringe/Needle, Disp, 27G X 5/8" 1 ML MISC; Use to give B12 injections  Dispense: 50 each; Refill: 0  4. Vitamin D deficiency Not at goal. Current vitamin D is 23.8, tested on 03/20/2020. Optimal goal > 50 ng/dL.  She is taking vitamin D 50,000 IU weekly.  Plan: Continue to take prescription Vitamin D @50 ,000 IU every week as prescribed.  Follow-up for  routine testing of Vitamin D, at least 2-3 times per year to avoid over-replacement.  - Refill Vitamin D, Ergocalciferol, (DRISDOL) 1.25 MG (50000 UNIT) CAPS capsule; TAKE 1 CAPSULE (50,000 UNITS TOTAL) BY MOUTH EVERY 7 (SEVEN) DAYS.  Dispense: 4 capsule; Refill: 0  5. Attention deficit hyperactivity disorder (ADHD), other type, BED Marisa Humphrey is taking Vyvanse 40 mg daily for BED and ADHD. Plan:  Increase Vyvanse to 60 mg per day.  People who binge eat feel as if they don't have control over how much they eat and have feelings of guilt or self-loathing after a binge eating episode. [place] estimates that about 30 percent of adults with binge eating disorder also have a history of ADHD. The FDA has approved Vyvanse as a treatment option for both ADHD and binge eating. Vyvanse targets the brain's reward center by increasing the levels of dopamine and norepinephrine, the chemicals of the brain responsible for feelings of pleasure. Mindful eating is the recommended nutritional approach to treating BED.   - Refill lisdexamfetamine (VYVANSE) 60 MG capsule; Take 1 capsule (60 mg total) by mouth every morning.  Dispense: 30 capsule; Refill: 0  I have consulted the [place] Controlled Substances Registry for this patient, and feel the risk/benefit ratio today is favorable for proceeding with this prescription for a controlled substance. The patient understands monitoring parameters and red flags.  6. Other depression, with emotional eating Not at goal. Medication:  Zoloft 50 mg daily and Wellbutrin XL 150 mg daily.  Plan:  Continue Zoloft 50 mg daily and increase Wellbutrin XL to 300 mg daily.  Behavior modification techniques were discussed today to help deal with emotional/non-hunger eating behaviors.  - Refill sertraline (ZOLOFT) 50 MG tablet; Take 1 tablet (50 mg total) by mouth daily.  Dispense: 30 tablet; Refill: 0 - Increase buPROPion (WELLBUTRIN XL) 300 MG 24 hr tablet; Take 1 tablet (300 mg total) by  mouth daily.  Dispense: 90 tablet; Refill: 0  7. At risk for heart disease Due to Marisa Humphrey's current state of health and medical condition(s), she is at a higher risk for heart disease.  This puts the patient at much greater risk to subsequently develop cardiopulmonary conditions that can significantly affect patient's quality of life in a negative manner.    At least 8 minutes were spent on counseling Marisa Humphrey about these concerns today. Evidence-based interventions for health behavior change were utilized today including the discussion of self monitoring techniques, problem-solving barriers, and SMART goal setting techniques.  Specifically, regarding patient's less desirable eating habits and patterns, we employed the technique of small changes when Marisa Humphrey has not been able to fully commit to her prudent nutritional plan.  8. Obesity, current BMI 42.5  Course: Marisa Humphrey is currently in the action stage of change. As such, her goal is to continue with weight loss efforts.   Nutrition goals: She has agreed to the Category 2 Plan.   Exercise goals: As tolerated.  Behavioral modification strategies: increasing lean protein intake, decreasing simple carbohydrates, increasing vegetables, increasing water intake and keeping a strict food journal.  Marisa Humphrey has agreed to follow-up with our clinic in 4 weeks. She was informed of the importance of frequent follow-up visits to maximize her success with intensive lifestyle modifications for her multiple health conditions.   Objective:   Blood pressure 138/83, pulse 80, temperature 98.6 F (37 C), temperature source Oral, height 5\' 7"  (1.702 m), weight 271 lb (122.9 kg), SpO2 98 %. Body mass index is 42.44 kg/m.  General: Cooperative, alert, well developed, in no acute distress. HEENT: Conjunctivae and lids unremarkable. Cardiovascular: Regular rhythm.  Lungs: Normal work of breathing. Neurologic: No focal deficits.   Lab  Results  Component Value Date   CREATININE 0.87 03/20/2020   BUN 9 03/20/2020   NA 139 03/20/2020   K 4.7 03/20/2020   CL 102 03/20/2020   CO2 23 03/20/2020   Lab Results  Component Value Date   ALT 13 03/20/2020   AST 14 03/20/2020   ALKPHOS 62 03/20/2020   BILITOT 0.3 03/20/2020   Lab Results  Component Value Date   HGBA1C 5.0 03/20/2020   Lab Results  Component Value Date   INSULIN 11.8 03/20/2020   Lab Results  Component Value Date   TSH 1.600 03/20/2020   Lab Results  Component Value Date   CHOL 123 03/20/2020   HDL 47 03/20/2020   LDLCALC 63 03/20/2020   TRIG 61 03/20/2020   CHOLHDL 2.9 11/29/2019   Lab Results  Component Value Date   WBC 8.6 03/20/2020   HGB 15.2 03/20/2020   HCT 46.2 03/20/2020   MCV 92 03/20/2020   PLT 354 03/20/2020   Lab Results  Component Value Date   IRON 57 03/20/2020   TIBC 340 03/20/2020   FERRITIN 58 03/20/2020   Attestation Statements:   Reviewed by clinician on day of visit: allergies, medications, problem list, medical history, surgical history,  family history, social history, and previous encounter notes.  I, Water quality scientist, CMA, am acting as transcriptionist for Briscoe Deutscher, DO  I have reviewed the above documentation for accuracy and completeness, and I agree with the above. Briscoe Deutscher, DO

## 2020-07-17 NOTE — Progress Notes (Deleted)
   Subjective:    I'm seeing this patient as a consultation for:  Dr. Juleen China and Dr. Carollee Herter. Note will be routed back to referring provider/PCP.  CC: B ankle/heel and R knee pain  I, Sascha Palma, LAT, ATC, am serving as scribe for Dr. Lynne Leader.  HPI: Pt is a 40 y/o female presenting w/ c/o B Achille's and R knee pain.  B Achille's/ankle pain:  -Radiating pain: -Swelling: -Aggravating factors: -Treatments tried:  R knee pain: -Swelling: -Mechanical symptoms: -Aggravating factors: -Treatments tried:  Diagnostic imaging: R knee XR- 08/08/17  Past medical history, Surgical history, Family history, Social history, Allergies, and medications have been entered into the medical record, reviewed. ***  Review of Systems: No new headache, visual changes, nausea, vomiting, diarrhea, constipation, dizziness, abdominal pain, skin rash, fevers, chills, night sweats, weight loss, swollen lymph nodes, body aches, joint swelling, muscle aches, chest pain, shortness of breath, mood changes, visual or auditory hallucinations.   Objective:   There were no vitals filed for this visit. General: Well Developed, well nourished, and in no acute distress.  Neuro/Psych: Alert and oriented x3, extra-ocular muscles intact, able to move all 4 extremities, sensation grossly intact. Skin: Warm and dry, no rashes noted.  Respiratory: Not using accessory muscles, speaking in full sentences, trachea midline.  Cardiovascular: Pulses palpable, no extremity edema. Abdomen: Does not appear distended. MSK: ***  Lab and Radiology Results No results found for this or any previous visit (from the past 72 hour(s)). No results found.  Impression and Recommendations:    Assessment and Plan: 40 y.o. female with ***.  PDMP not reviewed this encounter. No orders of the defined types were placed in this encounter.  No orders of the defined types were placed in this encounter.   Discussed warning signs or  symptoms. Please see discharge instructions. Patient expresses understanding.   ***

## 2020-07-18 ENCOUNTER — Other Ambulatory Visit (HOSPITAL_COMMUNITY): Payer: Self-pay

## 2020-07-18 ENCOUNTER — Ambulatory Visit: Payer: No Typology Code available for payment source | Admitting: Family Medicine

## 2020-07-21 ENCOUNTER — Other Ambulatory Visit: Payer: Self-pay

## 2020-07-21 DIAGNOSIS — F419 Anxiety disorder, unspecified: Secondary | ICD-10-CM

## 2020-07-22 MED ORDER — ALPRAZOLAM 0.25 MG PO TABS: 0.2500 mg | ORAL_TABLET | Freq: Two times a day (BID) | ORAL | 0 refills | Status: DC | PRN

## 2020-07-22 NOTE — Telephone Encounter (Signed)
Requesting: Xanax Contract: 10/13/2018 UDS: 10/13/2018 Last OV: 11/29/2019 Next OV: N/A Last Refill: 06/27/2020, #20--0 RF  Database:   Please advise

## 2020-08-10 ENCOUNTER — Encounter (INDEPENDENT_AMBULATORY_CARE_PROVIDER_SITE_OTHER): Payer: Self-pay | Admitting: Family Medicine

## 2020-08-11 ENCOUNTER — Ambulatory Visit (INDEPENDENT_AMBULATORY_CARE_PROVIDER_SITE_OTHER): Payer: No Typology Code available for payment source | Admitting: Family Medicine

## 2020-08-16 ENCOUNTER — Other Ambulatory Visit: Payer: Self-pay | Admitting: Family Medicine

## 2020-08-16 DIAGNOSIS — F419 Anxiety disorder, unspecified: Secondary | ICD-10-CM

## 2020-08-18 ENCOUNTER — Other Ambulatory Visit: Payer: Self-pay | Admitting: Family Medicine

## 2020-08-18 ENCOUNTER — Encounter (INDEPENDENT_AMBULATORY_CARE_PROVIDER_SITE_OTHER): Payer: Self-pay | Admitting: Family Medicine

## 2020-08-18 ENCOUNTER — Encounter: Payer: Self-pay | Admitting: Family Medicine

## 2020-08-18 DIAGNOSIS — F908 Attention-deficit hyperactivity disorder, other type: Secondary | ICD-10-CM

## 2020-08-18 DIAGNOSIS — F419 Anxiety disorder, unspecified: Secondary | ICD-10-CM

## 2020-08-18 MED ORDER — ALPRAZOLAM 0.25 MG PO TABS
0.2500 mg | ORAL_TABLET | Freq: Two times a day (BID) | ORAL | 0 refills | Status: DC | PRN
  Filled 2020-08-18: qty 20, 10d supply, fill #0

## 2020-08-18 MED ORDER — LISDEXAMFETAMINE DIMESYLATE 60 MG PO CAPS
60.0000 mg | ORAL_CAPSULE | ORAL | 0 refills | Status: DC
Start: 2020-08-18 — End: 2020-08-20
  Filled 2020-08-18: qty 30, 30d supply, fill #0

## 2020-08-18 NOTE — Telephone Encounter (Signed)
Appt schedule with pt. Patient requesting refill

## 2020-08-18 NOTE — Telephone Encounter (Signed)
I have attempted to call pt to get her scheduled for a follow up appt for Anxiety to refill meds. No answer so I left a message to call back.   Patient is requesting a refill of the following medications: Requested Prescriptions   Pending Prescriptions Disp Refills   ALPRAZolam (XANAX) 0.25 MG tablet 20 tablet 0    Sig: Take 1 tablet (0.25 mg total) by mouth 2 (two) times daily as needed for anxiety.    Date of patient request: 08/16/20 Last office visit: 11/29/19 Date of last refill: 07/22/20 Last refill amount: 20 + 0 Follow up time period per chart: None yet.

## 2020-08-18 NOTE — Telephone Encounter (Signed)
Requesting: Xanax Contract: 10/13/2018 UDS: 10/13/2018 Last OV: 11/29/19 Next OV: 08/26/20 Last Refill: 07/22/20, #20---0 RF Database:   Please advise

## 2020-08-19 ENCOUNTER — Ambulatory Visit (INDEPENDENT_AMBULATORY_CARE_PROVIDER_SITE_OTHER): Payer: No Typology Code available for payment source | Admitting: Family Medicine

## 2020-08-19 ENCOUNTER — Other Ambulatory Visit (HOSPITAL_COMMUNITY): Payer: Self-pay

## 2020-08-20 ENCOUNTER — Other Ambulatory Visit (HOSPITAL_COMMUNITY): Payer: Self-pay

## 2020-08-20 ENCOUNTER — Other Ambulatory Visit: Payer: Self-pay | Admitting: Family Medicine

## 2020-08-20 DIAGNOSIS — R41840 Attention and concentration deficit: Secondary | ICD-10-CM

## 2020-08-20 MED ORDER — AMPHETAMINE-DEXTROAMPHET ER 30 MG PO CP24
30.0000 mg | ORAL_CAPSULE | ORAL | 0 refills | Status: DC
Start: 2020-08-20 — End: 2020-09-16
  Filled 2020-08-20: qty 30, 30d supply, fill #0

## 2020-08-26 ENCOUNTER — Ambulatory Visit: Payer: No Typology Code available for payment source | Admitting: Family Medicine

## 2020-09-16 ENCOUNTER — Other Ambulatory Visit: Payer: Self-pay

## 2020-09-16 ENCOUNTER — Ambulatory Visit (INDEPENDENT_AMBULATORY_CARE_PROVIDER_SITE_OTHER): Payer: No Typology Code available for payment source | Admitting: Family Medicine

## 2020-09-16 ENCOUNTER — Encounter: Payer: Self-pay | Admitting: Family Medicine

## 2020-09-16 ENCOUNTER — Other Ambulatory Visit (HOSPITAL_BASED_OUTPATIENT_CLINIC_OR_DEPARTMENT_OTHER): Payer: Self-pay

## 2020-09-16 VITALS — BP 141/83 | HR 78 | Temp 99.2°F | Resp 12 | Ht 67.0 in | Wt 278.6 lb

## 2020-09-16 DIAGNOSIS — Z79899 Other long term (current) drug therapy: Secondary | ICD-10-CM

## 2020-09-16 DIAGNOSIS — R41840 Attention and concentration deficit: Secondary | ICD-10-CM | POA: Diagnosis not present

## 2020-09-16 DIAGNOSIS — F988 Other specified behavioral and emotional disorders with onset usually occurring in childhood and adolescence: Secondary | ICD-10-CM

## 2020-09-16 DIAGNOSIS — E559 Vitamin D deficiency, unspecified: Secondary | ICD-10-CM | POA: Insufficient documentation

## 2020-09-16 DIAGNOSIS — M25571 Pain in right ankle and joints of right foot: Secondary | ICD-10-CM

## 2020-09-16 DIAGNOSIS — M25572 Pain in left ankle and joints of left foot: Secondary | ICD-10-CM

## 2020-09-16 DIAGNOSIS — F419 Anxiety disorder, unspecified: Secondary | ICD-10-CM

## 2020-09-16 DIAGNOSIS — F418 Other specified anxiety disorders: Secondary | ICD-10-CM

## 2020-09-16 MED ORDER — ALPRAZOLAM 0.25 MG PO TABS
0.2500 mg | ORAL_TABLET | Freq: Two times a day (BID) | ORAL | 0 refills | Status: DC | PRN
  Filled 2020-09-16: qty 20, 10d supply, fill #0

## 2020-09-16 MED ORDER — AMPHETAMINE-DEXTROAMPHET ER 30 MG PO CP24
30.0000 mg | ORAL_CAPSULE | ORAL | 0 refills | Status: DC
  Filled 2020-09-16 – 2020-10-20 (×2): qty 30, 30d supply, fill #0

## 2020-09-16 MED ORDER — MELOXICAM 15 MG PO TABS
15.0000 mg | ORAL_TABLET | Freq: Every day | ORAL | 0 refills | Status: DC
  Filled 2020-09-16: qty 30, 30d supply, fill #0

## 2020-09-16 MED ORDER — AMPHETAMINE-DEXTROAMPHET ER 30 MG PO CP24
30.0000 mg | ORAL_CAPSULE | ORAL | 0 refills | Status: DC
  Filled 2020-09-16 – 2020-09-17 (×2): qty 30, 30d supply, fill #0

## 2020-09-16 MED ORDER — AMPHETAMINE-DEXTROAMPHET ER 30 MG PO CP24
30.0000 mg | ORAL_CAPSULE | ORAL | 0 refills | Status: DC
  Filled 2020-09-16 – 2020-11-19 (×2): qty 30, 30d supply, fill #0

## 2020-09-16 MED ORDER — VITAMIN D (ERGOCALCIFEROL) 1.25 MG (50000 UNIT) PO CAPS
ORAL_CAPSULE | ORAL | 0 refills | Status: DC
Start: 2020-09-16 — End: 2020-11-07
  Filled 2020-09-16: qty 4, 28d supply, fill #0

## 2020-09-16 NOTE — Assessment & Plan Note (Signed)
Putting off healthy weight and wellness for 6 months

## 2020-09-16 NOTE — Assessment & Plan Note (Signed)
Stable  con't wellbutrin 

## 2020-09-16 NOTE — Assessment & Plan Note (Addendum)
Refill meds

## 2020-09-16 NOTE — Assessment & Plan Note (Signed)
Contract updated meds refilled

## 2020-09-16 NOTE — Progress Notes (Signed)
Subjective:   By signing my name below, I, Shehryar Baig, attest that this documentation has been prepared under the direction and in the presence of Dr. Roma Schanz, DO. 09/16/2020    Patient ID: Marisa Humphrey, female    DOB: January 21, 1981, 40 y.o.   MRN: [id number]  Chief Complaint  Patient presents with   ADD   Anxiety   vitamin d deficiency   Foot Pain    Need refill on meloxicam    HPI Patient is in today for a office visit. She is requesting a refill on 15 mg meloxicam 2x daily PRN, 1.25 mg drisdol daily PO, 0.25 mg xanax daily PO, and 30 mg adderall daily PO. She continues using a healthy weight and wellness program. She reports changing her xanax for vyvanse and reports having weight gain and found that it was not effective in managing her focus.  She has recently started a new job and reports enjoying it.   Past Medical History:  Diagnosis Date   ADD (attention deficit disorder)    Anxiety    Depressed    Depression    Edema of both lower legs    Foot pain    GERD (gastroesophageal reflux disease)    Headache    History of condyloma acuminatum    Joint pain    Knee pain    Sleep apnea     Past Surgical History:  Procedure Laterality Date   EXCISION BONE CYST Left 06/05/2019   Procedure: EXCISION BONE CYST;  Surgeon: Leanora Cover, MD;  Location: [facility];  Service: Orthopedics;  Laterality: Left;   LAPAROSCOPIC GASTRIC BANDING  11/09/2005   Dr.Martin   MOLE REMOVAL     OPEN REDUCTION INTERNAL FIXATION (ORIF) FINGER WITH RADIAL BONE GRAFT Left 06/05/2019   Procedure: OPEN REDUCTION PIN FIXATION, CURRETAGE BONE LESION WITH CANCELLOUS CHIP BONE GRAFTING;  Surgeon: Leanora Cover, MD;  Location: [facility];  Service: Orthopedics;  Laterality: Left;    Family History  Problem Relation Age of Onset   Heart attack Father 50   Heart disease Father 79       MI   High Cholesterol Father    Sudden death Father     Alcoholism Father    Diabetes Mother    Hypertension Mother    Hyperlipidemia Mother    Depression Mother    Anxiety disorder Mother    Bipolar disorder Mother    Alcoholism Mother    Cancer Other        breast   Diabetes Maternal Grandmother    Multiple sclerosis Brother     Social History   Socioeconomic History   Marital status: Married    Spouse name: Gerald Stabs   Number of children: 1   Years of education: Not on file   Highest education level: Not on file  Occupational History   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    Employer: [employer]    Employer: [employer]  Tobacco Use   Smoking status: Former    Packs/day: 0.50    Years: 16.00    Pack years: 8.00    Types: Cigarettes   Smokeless tobacco: Never  Vaping Use   Vaping Use: Never used  Substance and Sexual Activity   Alcohol use: No   Drug use: No   Sexual activity: Yes    Partners: Male  Other Topics Concern   Not on file  Social History Narrative   Exercising --no  Social Determinants of Health   Financial Resource Strain: Not on file  Food Insecurity: Not on file  Transportation Needs: Not on file  Physical Activity: Not on file  Stress: Not on file  Social Connections: Not on file  Intimate Partner Violence: Not on file    Outpatient Medications Prior to Visit  Medication Sig Dispense Refill   acetaminophen (TYLENOL) 500 MG tablet Take 1,000 mg by mouth every 8 (eight) hours as needed.     buPROPion (WELLBUTRIN XL) 300 MG 24 hr tablet Take 1 tablet (300 mg total) by mouth daily. 90 tablet 0   cyanocobalamin (,VITAMIN B-12,) 1000 MCG/ML injection INJECT 1 ML (1,000 MCG TOTAL) INTO THE MUSCLE EVERY 14 (FOURTEEN) DAYS. 10 mL 0   ibuprofen (ADVIL) 800 MG tablet Take 800 mg by mouth every 8 (eight) hours as needed.     levonorgestrel (MIRENA) 20 MCG/24HR IUD 1 each by Intrauterine route once.     Syringe/Needle, Disp, 27G X 5/8" 1 ML MISC Use to give B12 injections 50 each 0   TUBERCULIN  SYR 1CC/27GX1/2" 27G X 1/2" 1 ML MISC USE TO GIVE B12 INJECTIONS 50 each 0   ALPRAZolam (XANAX) 0.25 MG tablet Take 1 tablet (0.25 mg total) by mouth 2 (two) times daily as needed for anxiety. 20 tablet 0   amphetamine-dextroamphetamine (ADDERALL XR) 30 MG 24 hr capsule Take 1 capsule (30 mg total) by mouth every morning. 30 capsule 0   meloxicam (MOBIC) 15 MG tablet Take 1 tablet (15 mg total) by mouth daily. 30 tablet 0   Vitamin D, Ergocalciferol, (DRISDOL) 1.25 MG (50000 UNIT) CAPS capsule TAKE 1 CAPSULE (50,000 UNITS TOTAL) BY MOUTH EVERY 7 (SEVEN) DAYS. 4 capsule 0   sertraline (ZOLOFT) 50 MG tablet Take 1 tablet (50 mg total) by mouth daily. 30 tablet 0   No facility-administered medications prior to visit.    Allergies  Allergen Reactions   Penicillins Rash    Amoxil has been taken w/o reaction    Review of Systems  Constitutional:  Negative for chills, fever and malaise/fatigue.  HENT:  Negative for congestion and hearing loss.   Eyes:  Negative for blurred vision and discharge.  Respiratory:  Negative for cough, sputum production and shortness of breath.   Cardiovascular:  Negative for chest pain, palpitations and leg swelling.  Gastrointestinal:  Negative for abdominal pain, blood in stool, constipation, diarrhea, heartburn, nausea and vomiting.  Genitourinary:  Negative for dysuria, frequency, hematuria and urgency.  Musculoskeletal:  Negative for back pain, falls and myalgias.  Skin:  Negative for rash.  Neurological:  Negative for dizziness, sensory change, loss of consciousness, weakness and headaches.  Endo/Heme/Allergies:  Negative for environmental allergies. Does not bruise/bleed easily.  Psychiatric/Behavioral:  Negative for depression and suicidal ideas. The patient is not nervous/anxious and does not have insomnia.       Objective:    Physical Exam Vitals and nursing note reviewed.  Constitutional:      General: She is not in acute distress.    Appearance:  Normal appearance. She is not ill-appearing.  HENT:     Head: Normocephalic and atraumatic.     Right Ear: External ear normal.     Left Ear: External ear normal.  Eyes:     Extraocular Movements: Extraocular movements intact.     Pupils: Pupils are equal, round, and reactive to light.  Cardiovascular:     Rate and Rhythm: Normal rate and regular rhythm.     Pulses: Normal  pulses.     Heart sounds: Normal heart sounds. No murmur heard.   No gallop.  Pulmonary:     Effort: Pulmonary effort is normal. No respiratory distress.     Breath sounds: Normal breath sounds. No wheezing, rhonchi or rales.  Skin:    General: Skin is warm and dry.  Neurological:     Mental Status: She is alert and oriented to person, place, and time.  Psychiatric:        Mood and Affect: Mood normal.        Behavior: Behavior normal.        Thought Content: Thought content normal.    BP (!) 141/83 (BP Location: Left Arm, Cuff Size: Large)   Pulse 78   Temp 99.2 F (37.3 C) (Oral)   Resp 12   Ht 5\' 7"  (1.702 m)   Wt 278 lb 9.6 oz (126.4 kg)   SpO2 100%   BMI 43.63 kg/m  Wt Readings from Last 3 Encounters:  09/16/20 278 lb 9.6 oz (126.4 kg)  07/02/20 271 lb (122.9 kg)  04/03/20 264 lb (119.7 kg)    Diabetic Foot Exam - Simple   No data filed    Lab Results  Component Value Date   WBC 8.6 03/20/2020   HGB 15.2 03/20/2020   HCT 46.2 03/20/2020   PLT 354 03/20/2020   GLUCOSE 93 03/20/2020   CHOL 123 03/20/2020   TRIG 61 03/20/2020   HDL 47 03/20/2020   LDLCALC 63 03/20/2020   ALT 13 03/20/2020   AST 14 03/20/2020   NA 139 03/20/2020   K 4.7 03/20/2020   CL 102 03/20/2020   CREATININE 0.87 03/20/2020   BUN 9 03/20/2020   CO2 23 03/20/2020   TSH 1.600 03/20/2020   HGBA1C 5.0 03/20/2020    Lab Results  Component Value Date   TSH 1.600 03/20/2020   Lab Results  Component Value Date   WBC 8.6 03/20/2020   HGB 15.2 03/20/2020   HCT 46.2 03/20/2020   MCV 92 03/20/2020   PLT 354  03/20/2020   Lab Results  Component Value Date   NA 139 03/20/2020   K 4.7 03/20/2020   CO2 23 03/20/2020   GLUCOSE 93 03/20/2020   BUN 9 03/20/2020   CREATININE 0.87 03/20/2020   BILITOT 0.3 03/20/2020   ALKPHOS 62 03/20/2020   AST 14 03/20/2020   ALT 13 03/20/2020   PROT 6.8 03/20/2020   ALBUMIN 4.5 03/20/2020   CALCIUM 9.2 03/20/2020   ANIONGAP 7 01/20/2015   GFR 72.14 12/27/2014   Lab Results  Component Value Date   CHOL 123 03/20/2020   Lab Results  Component Value Date   HDL 47 03/20/2020   Lab Results  Component Value Date   LDLCALC 63 03/20/2020   Lab Results  Component Value Date   TRIG 61 03/20/2020   Lab Results  Component Value Date   CHOLHDL 2.9 11/29/2019   Lab Results  Component Value Date   HGBA1C 5.0 03/20/2020       Assessment & Plan:   Problem List Items Addressed This Visit       Unprioritized   Morbid obesity ([place]) (Chronic)    Putting off healthy weight and wellness for 6 months       Relevant Medications   amphetamine-dextroamphetamine (ADDERALL XR) 30 MG 24 hr capsule   amphetamine-dextroamphetamine (ADDERALL XR) 30 MG 24 hr capsule   amphetamine-dextroamphetamine (ADDERALL XR) 30 MG 24 hr capsule   Attention  deficit - Primary   Relevant Medications   amphetamine-dextroamphetamine (ADDERALL XR) 30 MG 24 hr capsule   amphetamine-dextroamphetamine (ADDERALL XR) 30 MG 24 hr capsule   amphetamine-dextroamphetamine (ADDERALL XR) 30 MG 24 hr capsule   Other Relevant Orders   DRUG MONITORING, PANEL 8 WITH CONFIRMATION, URINE   Attention deficit 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    Contract updated meds refilled        Depression with anxiety    Stable -- con't wellbutrin        Relevant Medications   ALPRAZolam (XANAX) 0.25 MG tablet   Vitamin D deficiency     Refill meds        Relevant Medications   Vitamin D, Ergocalciferol, (DRISDOL) 1.25 MG (50000 UNIT) CAPS capsule   Other Visit Diagnoses     Anxiety        Relevant Medications   ALPRAZolam (XANAX) 0.25 MG tablet   Other Relevant Orders   DRUG MONITORING, PANEL 8 WITH CONFIRMATION, URINE   Bilateral ankle pain, unspecified chronicity       Relevant Medications   meloxicam (MOBIC) 15 MG tablet   High risk medication use       Relevant Orders   DRUG MONITORING, PANEL 8 WITH CONFIRMATION, URINE        Meds ordered this encounter  Medications   amphetamine-dextroamphetamine (ADDERALL XR) 30 MG 24 hr capsule    Sig: Take 1 capsule (30 mg total) by mouth every morning.    Dispense:  30 capsule    Refill:  0    Do not fill until Sept 2022   ALPRAZolam (XANAX) 0.25 MG tablet    Sig: Take 1 tablet (0.25 mg total) by mouth 2 (two) times daily as needed for anxiety.    Dispense:  20 tablet    Refill:  0   Vitamin D, Ergocalciferol, (DRISDOL) 1.25 MG (50000 UNIT) CAPS capsule    Sig: TAKE 1 CAPSULE (50,000 UNITS TOTAL) BY MOUTH EVERY 7 (SEVEN) DAYS.    Dispense:  4 capsule    Refill:  0   meloxicam (MOBIC) 15 MG tablet    Sig: Take 1 tablet (15 mg total) by mouth daily.    Dispense:  30 tablet    Refill:  0   amphetamine-dextroamphetamine (ADDERALL XR) 30 MG 24 hr capsule    Sig: Take 1 capsule (30 mg total) by mouth every morning.    Dispense:  30 capsule    Refill:  0    Do not fill until August 2022   amphetamine-dextroamphetamine (ADDERALL XR) 30 MG 24 hr capsule    Sig: Take 1 capsule (30 mg total) by mouth every morning.    Dispense:  30 capsule    Refill:  0    I, Dr. Roma Schanz, DO, personally preformed the services described in this documentation.  All medical record entries made by the scribe were at my direction and in my presence.  I have reviewed the chart and discharge instructions (if applicable) and agree that the record reflects my personal performance and is accurate and complete. 09/16/2020   I,Shehryar Baig,acting as a Education administrator for [employer], DO.,have documented all relevant documentation  on the behalf of Ann Held, DO,as directed by  Ann Held, DO while in the presence of Ann Held, DO.   Ann Held, DO

## 2020-09-16 NOTE — Patient Instructions (Signed)
Living With Attention Deficit Hyperactivity Disorder If you have been diagnosed with attention deficit hyperactivity disorder (ADHD), you may be relieved that you now know why you have felt or behaved a certain way. Still, you may feel overwhelmed about the treatment ahead. You may also wonder how to get the support you need and how to deal with the condition day-to-day. With treatment and support, you can live with ADHD and manage your symptoms. How to manage lifestyle changes Managing stress Stress is your body's reaction to life changes and events, both good and bad. To cope with the stress of an ADHD diagnosis, it may help to: Learn more about ADHD. Exercise regularly. Even a short daily walk can lower stress levels. Participate in training or education programs (including social skills training classes) that teach you to deal with symptoms.  Medicines Your health care provider may suggest certain medicines if he or she feels that they will help to improve your condition. Stimulant medicines are usually prescribed to treat ADHD, and therapy may also be prescribed. It is important to: Avoid using alcohol and other substances that may prevent your medicines from working properly (may interact). Talk with your pharmacist or health care provider about all the medicines that you take, their possible side effects, and what medicines are safe to take together. Make it your goal to take part in all treatment decisions (shared decision-making). Ask about possible side effects of medicines that your health care provider recommends, and tell him or her how you feel about having those side effects. It is best if shared decision-making with your health care provider is part of your total treatment plan. Relationships To strengthen your relationships with family members while treating your condition, consider taking part in family therapy. You might also attend self-help groups alone or with a loved one. Be  honest about how your symptoms affect your relationships. Make an effort to communicate respectfully instead of fighting, and find ways to show others that you care. Psychotherapy may be useful in helping you cope with how ADHD affects your relationships. How to recognize changes in your condition The following signs may mean that your treatment is working well and your condition is improving: Consistently being on time for appointments. Being more organized at home and work. Other people noticing improvements in your behavior. Achieving goals that you set for yourself. Thinking more clearly. The following signs may mean that your treatment is not working very well: Feeling impatience or more confusion. Missing, forgetting, or being late for appointments. An increasing sense of disorganization and messiness. More difficulty in reaching goals that you set for yourself. Loved ones becoming angry or frustrated with you. Follow these instructions at home: Take over-the-counter and prescription medicines only as told by your health care provider. Check with your health care provider before taking any new medicines. Create structure and an organized atmosphere at home. For example: Make a list of tasks, then rank them from most important to least important. Work on one task at a time until your listed tasks are done. Make a daily schedule and follow it consistently every day. Use an appointment calendar, and check it 2 or 3 times a day to keep on track. Keep it with you when you leave the house. Create spaces where you keep certain things, and always put things back in their places after you use them. Keep all follow-up visits as told by your health care provider. This is important. Where to find support Talking to others    Keep emotion out of important discussions and speak in a calm, logical way. Listen closely and patiently to your loved ones. Try to understand their point of view, and try to  avoid getting defensive. Take responsibility for the consequences of your actions. Ask that others do not take your behaviors personally. Aim to solve problems as they come up, and express your feelings instead of bottling them up. Talk openly about what you need from your loved ones and how they can support you. Consider going to family therapy sessions or having your family meet with a specialist who deals with ADHD-related behavior problems. Finances Not all insurance plans cover mental health care, so it is important to check with your insurance carrier. If paying for co-pays or counseling services is a problem, search for a local or county mental health care center. Public mental health care services may be offered there at a low cost or no cost when you are not able to see a private health care provider. If you are taking medicine for ADHD, you may be able to get the generic form, which may be less expensive than brand-name medicine. Some makers of prescription medicines also offer help to patients who cannot afford the medicines that they need. Questions to ask your health care provider: What are the risks and benefits of taking medicines? Would I benefit from therapy? How often should I follow up with a health care provider? Contact a health care provider if: You have side effects from your medicines, such as: Repeated muscle twitches, coughing, or speech outbursts. Sleep problems. Loss of appetite. Depression. New or worsening behavior problems. Dizziness. Unusually fast heartbeat. Stomach pains. Headaches. Get help right away if: You have a severe reaction to a medicine. Your behavior suddenly gets worse. Summary With treatment and support, you can live with ADHD and manage your symptoms. The medicines that are most often prescribed for ADHD are stimulants. Consider taking part in family therapy or self-help groups with family members or friends. When you talk with friends  and family about your ADHD, be patient and communicate openly. Take over-the-counter and prescription medicines only as told by your health care provider. Check with your health care provider before taking any new medicines. This information is not intended to replace advice given to you by your health care provider. Make sure you discuss any questions you have with your health care provider. Document Revised: 08/01/2019 Document Reviewed: 08/01/2019 Elsevier Patient Education  2022 Elsevier Inc.  

## 2020-09-17 ENCOUNTER — Other Ambulatory Visit (HOSPITAL_BASED_OUTPATIENT_CLINIC_OR_DEPARTMENT_OTHER): Payer: Self-pay

## 2020-09-18 LAB — DRUG MONITORING, PANEL 8 WITH CONFIRMATION, URINE
6 Acetylmorphine: NEGATIVE ng/mL (ref <10)
Alcohol Metabolites: NEGATIVE ng/mL (ref <500)
Alphahydroxyalprazolam: 49 ng/mL — ABNORMAL HIGH (ref <25)
Alphahydroxymidazolam: NEGATIVE ng/mL (ref <50)
Alphahydroxytriazolam: NEGATIVE ng/mL (ref <50)
Aminoclonazepam: NEGATIVE ng/mL (ref <25)
Amphetamine: 8636 ng/mL — ABNORMAL HIGH (ref <250)
Amphetamines: POSITIVE ng/mL — AB (ref <500)
Benzodiazepines: POSITIVE ng/mL — AB (ref <100)
Buprenorphine, Urine: NEGATIVE ng/mL (ref <5)
Cocaine Metabolite: NEGATIVE ng/mL (ref <150)
Creatinine: 186.1 mg/dL (ref >=20.0)
Hydroxyethylflurazepam: NEGATIVE ng/mL (ref <50)
Lorazepam: NEGATIVE ng/mL (ref <50)
MDMA: NEGATIVE ng/mL (ref <500)
Marijuana Metabolite: NEGATIVE ng/mL (ref <20)
Methamphetamine: NEGATIVE ng/mL (ref <250)
Nordiazepam: NEGATIVE ng/mL (ref <50)
Opiates: NEGATIVE ng/mL (ref <100)
Oxazepam: NEGATIVE ng/mL (ref <50)
Oxidant: NEGATIVE ug/mL (ref <200)
Oxycodone: NEGATIVE ng/mL (ref <100)
Temazepam: NEGATIVE ng/mL (ref <50)
pH: 5.8 (ref 4.5–9.0)

## 2020-09-18 LAB — DM TEMPLATE

## 2020-10-13 ENCOUNTER — Encounter: Payer: Self-pay | Admitting: Family Medicine

## 2020-10-13 DIAGNOSIS — F419 Anxiety disorder, unspecified: Secondary | ICD-10-CM

## 2020-10-13 MED ORDER — ALPRAZOLAM 0.25 MG PO TABS
0.2500 mg | ORAL_TABLET | Freq: Two times a day (BID) | ORAL | 0 refills | Status: DC | PRN
  Filled 2020-10-13: qty 20, 10d supply, fill #0

## 2020-10-13 NOTE — Telephone Encounter (Signed)
Requesting: Xanax Contract: 09/16/20 UDS: 09/16/20 Last OV: 09/16/20 Next OV: 12/02/20 Last Refill: 09/16/20, #20--0 RF Database:   Please advise

## 2020-10-14 ENCOUNTER — Other Ambulatory Visit (HOSPITAL_COMMUNITY): Payer: Self-pay

## 2020-10-20 ENCOUNTER — Other Ambulatory Visit (HOSPITAL_BASED_OUTPATIENT_CLINIC_OR_DEPARTMENT_OTHER): Payer: Self-pay

## 2020-11-07 ENCOUNTER — Other Ambulatory Visit: Payer: Self-pay | Admitting: Family Medicine

## 2020-11-07 DIAGNOSIS — M25571 Pain in right ankle and joints of right foot: Secondary | ICD-10-CM

## 2020-11-07 DIAGNOSIS — F419 Anxiety disorder, unspecified: Secondary | ICD-10-CM

## 2020-11-07 DIAGNOSIS — E559 Vitamin D deficiency, unspecified: Secondary | ICD-10-CM

## 2020-11-07 NOTE — Telephone Encounter (Signed)
Requesting:alprazolam Contract:09/16/2020 UDS:09/16/2020 Last Visit:09/16/2020 Next Visit:12/02/2020 Last Refill:10/13/2020  Please Advise

## 2020-11-10 ENCOUNTER — Other Ambulatory Visit (HOSPITAL_COMMUNITY): Payer: Self-pay

## 2020-11-10 MED ORDER — VITAMIN D (ERGOCALCIFEROL) 1.25 MG (50000 UNIT) PO CAPS
ORAL_CAPSULE | ORAL | 0 refills | Status: DC
Start: 2020-11-10 — End: 2021-05-18
  Filled 2020-11-10: qty 4, 28d supply, fill #0

## 2020-11-10 MED ORDER — MELOXICAM 15 MG PO TABS
15.0000 mg | ORAL_TABLET | Freq: Every day | ORAL | 0 refills | Status: DC
  Filled 2020-11-10: qty 30, 30d supply, fill #0

## 2020-11-10 MED ORDER — ALPRAZOLAM 0.25 MG PO TABS
0.2500 mg | ORAL_TABLET | Freq: Two times a day (BID) | ORAL | 0 refills | Status: DC | PRN
  Filled 2020-11-10: qty 20, 10d supply, fill #0

## 2020-11-19 ENCOUNTER — Other Ambulatory Visit: Payer: Self-pay | Admitting: Family Medicine

## 2020-11-19 ENCOUNTER — Other Ambulatory Visit (HOSPITAL_COMMUNITY): Payer: Self-pay

## 2020-11-19 DIAGNOSIS — R41840 Attention and concentration deficit: Secondary | ICD-10-CM

## 2020-11-19 MED ORDER — AMPHETAMINE-DEXTROAMPHET ER 30 MG PO CP24
30.0000 mg | ORAL_CAPSULE | ORAL | 0 refills | Status: DC
Start: 2021-01-19 — End: 2020-12-21
  Filled 2020-11-19: qty 30, 30d supply, fill #0

## 2020-11-19 MED ORDER — AMPHETAMINE-DEXTROAMPHET ER 30 MG PO CP24
30.0000 mg | ORAL_CAPSULE | ORAL | 0 refills | Status: DC
  Filled 2020-11-19: qty 30, 30d supply, fill #0

## 2020-11-19 NOTE — Telephone Encounter (Signed)
Medication: amphetamine-dextroamphetamine (ADDERALL XR) 30 MG 24 hr capsule   Has the patient contacted their pharmacy? Yes.   (If no, request that the patient contact the pharmacy for the refill.) (If yes, when and what did the pharmacy advise?)  Preferred Pharmacy (with phone number or street name):  [facility]   Agent: Please be advised that RX refills may take up to 3 business days. We ask that you follow-up with your pharmacy.

## 2020-11-19 NOTE — Telephone Encounter (Signed)
Requesting: Adderall XR 30mg  Contract: 09/16/2020 UDS: 09/16/2020 Last Visit: 09/16/2020 Next Visit: 12/02/2020 Last Refill: 09/16/2020 #30 and Marcelyn Ditty (3x)  New pharmacy   Please Advise

## 2020-12-02 ENCOUNTER — Encounter: Payer: No Typology Code available for payment source | Admitting: Family Medicine

## 2020-12-10 ENCOUNTER — Other Ambulatory Visit (HOSPITAL_COMMUNITY): Payer: Self-pay

## 2020-12-11 ENCOUNTER — Other Ambulatory Visit: Payer: Self-pay | Admitting: Family Medicine

## 2020-12-11 ENCOUNTER — Other Ambulatory Visit (HOSPITAL_COMMUNITY): Payer: Self-pay

## 2020-12-11 DIAGNOSIS — F419 Anxiety disorder, unspecified: Secondary | ICD-10-CM

## 2020-12-11 NOTE — Telephone Encounter (Signed)
Rx #: [id number]  ALPRAZolam (XANAX) 0.25 MG tablet  Last filled on 11/10/2020 for number 20. Please send to [place] and not [facility].  Mabeline Caras tried faxing prescription yesterday 10.12.22 but wasn't able to get it to go through. If any questions please contact.   [phone] Fax: [phone]

## 2020-12-12 ENCOUNTER — Encounter: Payer: Self-pay | Admitting: Family Medicine

## 2020-12-12 ENCOUNTER — Other Ambulatory Visit (HOSPITAL_COMMUNITY): Payer: Self-pay

## 2020-12-12 MED ORDER — ALPRAZOLAM 0.25 MG PO TABS
0.2500 mg | ORAL_TABLET | Freq: Two times a day (BID) | ORAL | 0 refills | Status: DC | PRN
  Filled 2020-12-12: qty 20, 10d supply, fill #0

## 2020-12-12 NOTE — Telephone Encounter (Signed)
Requesting: Xanax Contract: 09/16/2020 UDS: 09/16/2020 Last OV: 09/16/2020 Next OV: N/A Last Refill: 11/10/2020, #20--0 RF Database:   Please advise

## 2020-12-12 NOTE — Telephone Encounter (Signed)
Pt. Called in and stated could rx. Be sent to [facility]. She is scared that Dr. Aletha Halim send in after 5 and it will be to late to get it today since they close at 86 but [facility] is open Sat.

## 2020-12-16 ENCOUNTER — Other Ambulatory Visit (HOSPITAL_COMMUNITY): Payer: Self-pay

## 2020-12-18 ENCOUNTER — Other Ambulatory Visit: Payer: Self-pay | Admitting: Family Medicine

## 2020-12-18 ENCOUNTER — Other Ambulatory Visit (HOSPITAL_COMMUNITY): Payer: Self-pay

## 2020-12-18 DIAGNOSIS — R41840 Attention and concentration deficit: Secondary | ICD-10-CM

## 2020-12-18 NOTE — Telephone Encounter (Signed)
Requesting: Adderall XR Contract: 09/16/20 UDS: 09/16/20 Last OV: 09/16/2020 Next OV: N/A Last Refill: 02/18/2021, #30--0 RF Database:   Please advise

## 2020-12-19 ENCOUNTER — Other Ambulatory Visit: Payer: Self-pay | Admitting: Family Medicine

## 2020-12-19 ENCOUNTER — Encounter: Payer: Self-pay | Admitting: Family Medicine

## 2020-12-19 ENCOUNTER — Other Ambulatory Visit (HOSPITAL_COMMUNITY): Payer: Self-pay

## 2020-12-19 DIAGNOSIS — R41840 Attention and concentration deficit: Secondary | ICD-10-CM

## 2020-12-19 MED ORDER — AMPHETAMINE-DEXTROAMPHET ER 30 MG PO CP24
30.0000 mg | ORAL_CAPSULE | ORAL | 0 refills | Status: DC
  Filled 2020-12-19: qty 30, 30d supply, fill #0

## 2020-12-19 NOTE — Telephone Encounter (Signed)
Requesting: Adderall XR   Pt need refill sent in due last refills not having October start date.

## 2020-12-21 ENCOUNTER — Other Ambulatory Visit: Payer: Self-pay | Admitting: Family Medicine

## 2020-12-21 DIAGNOSIS — R41840 Attention and concentration deficit: Secondary | ICD-10-CM

## 2020-12-21 MED ORDER — AMPHETAMINE-DEXTROAMPHET ER 30 MG PO CP24
30.0000 mg | ORAL_CAPSULE | ORAL | 0 refills | Status: DC
Start: 2021-01-19 — End: 2021-04-16
  Filled 2021-01-19: qty 30, 30d supply, fill #0

## 2020-12-21 MED ORDER — AMPHETAMINE-DEXTROAMPHET ER 30 MG PO CP24
30.0000 mg | ORAL_CAPSULE | ORAL | 0 refills | Status: DC
  Filled 2020-12-22: qty 30, 30d supply, fill #0

## 2020-12-21 MED ORDER — AMPHETAMINE-DEXTROAMPHET ER 30 MG PO CP24
30.0000 mg | ORAL_CAPSULE | ORAL | 0 refills | Status: DC
  Filled 2021-02-18: qty 30, 30d supply, fill #0

## 2020-12-22 ENCOUNTER — Telehealth: Payer: No Typology Code available for payment source | Admitting: Physician Assistant

## 2020-12-22 ENCOUNTER — Other Ambulatory Visit (HOSPITAL_COMMUNITY): Payer: Self-pay

## 2020-12-22 DIAGNOSIS — B9689 Other specified bacterial agents as the cause of diseases classified elsewhere: Secondary | ICD-10-CM

## 2020-12-22 DIAGNOSIS — J069 Acute upper respiratory infection, unspecified: Secondary | ICD-10-CM

## 2020-12-22 MED ORDER — CARESTART COVID-19 HOME TEST VI KIT
PACK | 0 refills | Status: DC
  Filled 2020-12-22: qty 4, 4d supply, fill #0

## 2020-12-22 MED ORDER — DOXYCYCLINE HYCLATE 100 MG PO TABS
100.0000 mg | ORAL_TABLET | Freq: Two times a day (BID) | ORAL | 0 refills | Status: DC
  Filled 2020-12-22: qty 20, 10d supply, fill #0

## 2020-12-22 NOTE — Progress Notes (Signed)

## 2021-01-08 ENCOUNTER — Other Ambulatory Visit: Payer: Self-pay | Admitting: Family Medicine

## 2021-01-08 ENCOUNTER — Other Ambulatory Visit (HOSPITAL_COMMUNITY): Payer: Self-pay

## 2021-01-08 DIAGNOSIS — F419 Anxiety disorder, unspecified: Secondary | ICD-10-CM

## 2021-01-08 MED ORDER — ALPRAZOLAM 0.25 MG PO TABS
0.2500 mg | ORAL_TABLET | Freq: Two times a day (BID) | ORAL | 0 refills | Status: DC | PRN
  Filled 2021-01-08 (×2): qty 20, 10d supply, fill #0

## 2021-01-08 NOTE — Telephone Encounter (Signed)
Requesting: alprazolam 0.25mg  Contract:09/16/20 UDS: 09/16/20 Last Visit:09/16/20 Next Visit: None Last Refill: 12/12/20 #20 and 0RF  Please Advise

## 2021-01-20 ENCOUNTER — Other Ambulatory Visit (HOSPITAL_COMMUNITY): Payer: Self-pay

## 2021-01-21 ENCOUNTER — Other Ambulatory Visit (HOSPITAL_COMMUNITY): Payer: Self-pay

## 2021-02-10 ENCOUNTER — Other Ambulatory Visit: Payer: Self-pay | Admitting: Family Medicine

## 2021-02-10 DIAGNOSIS — F419 Anxiety disorder, unspecified: Secondary | ICD-10-CM

## 2021-02-11 ENCOUNTER — Other Ambulatory Visit (HOSPITAL_COMMUNITY): Payer: Self-pay

## 2021-02-11 MED ORDER — ALPRAZOLAM 0.25 MG PO TABS
0.2500 mg | ORAL_TABLET | Freq: Two times a day (BID) | ORAL | 0 refills | Status: DC | PRN
  Filled 2021-02-11: qty 20, 10d supply, fill #0

## 2021-02-11 NOTE — Telephone Encounter (Signed)
Requesting: Xanax Contract: 09/16/2020 UDS: 09/16/2020 Last OV: 09/16/2020 Next OV: N/A Last Refill: 01/08/2021 #20--0 RF Database:   Please advise

## 2021-02-17 ENCOUNTER — Other Ambulatory Visit (HOSPITAL_COMMUNITY): Payer: Self-pay

## 2021-02-18 ENCOUNTER — Other Ambulatory Visit (HOSPITAL_COMMUNITY): Payer: Self-pay

## 2021-03-06 ENCOUNTER — Other Ambulatory Visit (HOSPITAL_COMMUNITY): Payer: Self-pay

## 2021-03-06 ENCOUNTER — Other Ambulatory Visit: Payer: Self-pay | Admitting: Family Medicine

## 2021-03-06 DIAGNOSIS — F419 Anxiety disorder, unspecified: Secondary | ICD-10-CM

## 2021-03-06 MED ORDER — ALPRAZOLAM 0.25 MG PO TABS
0.2500 mg | ORAL_TABLET | Freq: Two times a day (BID) | ORAL | 0 refills | Status: DC | PRN
  Filled 2021-03-06: qty 20, 10d supply, fill #0

## 2021-03-06 NOTE — Telephone Encounter (Signed)
Requesting: alprazolam 0.25mg   Contract: 09/16/2020 UDS: 09/16/2020 Last Visit: 09/16/2020 Next Visit: None Last Refill: 02/11/2021 #20 and 0RF  Please Advise

## 2021-03-11 ENCOUNTER — Other Ambulatory Visit (HOSPITAL_COMMUNITY): Payer: Self-pay

## 2021-03-13 ENCOUNTER — Other Ambulatory Visit (HOSPITAL_COMMUNITY): Payer: Self-pay

## 2021-03-18 ENCOUNTER — Other Ambulatory Visit: Payer: Self-pay | Admitting: Family Medicine

## 2021-03-18 ENCOUNTER — Other Ambulatory Visit (HOSPITAL_COMMUNITY): Payer: Self-pay

## 2021-03-18 DIAGNOSIS — R41840 Attention and concentration deficit: Secondary | ICD-10-CM

## 2021-03-18 NOTE — Telephone Encounter (Signed)
Requesting: Adderall XR 30mg  Contract: 09/16/20 UDS: 09/16/20 Last Visit: 09/16/20 Next Visit: none Last Refill: 02/18/21  Please Advise

## 2021-03-19 ENCOUNTER — Other Ambulatory Visit: Payer: Self-pay | Admitting: Family Medicine

## 2021-03-19 ENCOUNTER — Other Ambulatory Visit (HOSPITAL_COMMUNITY): Payer: Self-pay

## 2021-03-19 DIAGNOSIS — R41840 Attention and concentration deficit: Secondary | ICD-10-CM

## 2021-03-20 ENCOUNTER — Other Ambulatory Visit (HOSPITAL_COMMUNITY): Payer: Self-pay

## 2021-03-20 MED ORDER — AMPHETAMINE-DEXTROAMPHET ER 30 MG PO CP24
30.0000 mg | ORAL_CAPSULE | ORAL | 0 refills | Status: DC
  Filled 2021-03-20: qty 30, 30d supply, fill #0

## 2021-03-20 NOTE — Telephone Encounter (Signed)
Patient is requesting a refill of the following medications: Requested Prescriptions   Pending Prescriptions Disp Refills   amphetamine-dextroamphetamine (ADDERALL XR) 30 MG 24 hr capsule 30 capsule 0    Sig: Take 1 capsule (30 mg total) by mouth every morning    Date of patient request: 03/19/21 Last office visit: 09/16/20 Date of last refill: 02/18/21 (Oct to Dec) Last refill amount: 30 Follow up time period per chart: NONE  UDS: 09/16/20 Contract: 10/13/18  DOES PT NEED A F/U APPT?

## 2021-03-20 NOTE — Telephone Encounter (Signed)
Pt has been scheduled for CPE and med management.

## 2021-04-07 ENCOUNTER — Other Ambulatory Visit (HOSPITAL_COMMUNITY): Payer: Self-pay

## 2021-04-07 ENCOUNTER — Other Ambulatory Visit: Payer: Self-pay | Admitting: Family Medicine

## 2021-04-07 DIAGNOSIS — F419 Anxiety disorder, unspecified: Secondary | ICD-10-CM

## 2021-04-07 MED ORDER — ALPRAZOLAM 0.25 MG PO TABS
0.2500 mg | ORAL_TABLET | Freq: Two times a day (BID) | ORAL | 0 refills | Status: DC | PRN
  Filled 2021-04-07: qty 20, 10d supply, fill #0

## 2021-04-07 MED ORDER — CARESTART COVID-19 HOME TEST VI KIT
PACK | 0 refills | Status: DC
  Filled 2021-04-07: qty 4, 4d supply, fill #0

## 2021-04-07 NOTE — Telephone Encounter (Signed)
[place] Pt  Requesting: alprazolam 0.25mg   Contract: 09/16/2020 UDS: 09/16/2020 Last Visit: 09/16/2020 Next Visit: None Last Refill: 03/06/2021 #20 and 0RF  Please Advise

## 2021-04-16 ENCOUNTER — Ambulatory Visit (INDEPENDENT_AMBULATORY_CARE_PROVIDER_SITE_OTHER): Payer: No Typology Code available for payment source | Admitting: Family Medicine

## 2021-04-16 ENCOUNTER — Encounter: Payer: Self-pay | Admitting: Family Medicine

## 2021-04-16 ENCOUNTER — Encounter: Payer: No Typology Code available for payment source | Admitting: Family Medicine

## 2021-04-16 ENCOUNTER — Other Ambulatory Visit (HOSPITAL_COMMUNITY): Payer: Self-pay

## 2021-04-16 VITALS — BP 130/90 | HR 80 | Temp 98.5°F | Resp 18 | Ht 67.0 in | Wt 280.0 lb

## 2021-04-16 DIAGNOSIS — R41840 Attention and concentration deficit: Secondary | ICD-10-CM

## 2021-04-16 DIAGNOSIS — F419 Anxiety disorder, unspecified: Secondary | ICD-10-CM | POA: Diagnosis not present

## 2021-04-16 DIAGNOSIS — Z0000 Encounter for general adult medical examination without abnormal findings: Secondary | ICD-10-CM | POA: Diagnosis not present

## 2021-04-16 DIAGNOSIS — F418 Other specified anxiety disorders: Secondary | ICD-10-CM

## 2021-04-16 DIAGNOSIS — F988 Other specified behavioral and emotional disorders with onset usually occurring in childhood and adolescence: Secondary | ICD-10-CM | POA: Diagnosis not present

## 2021-04-16 DIAGNOSIS — E538 Deficiency of other specified B group vitamins: Secondary | ICD-10-CM

## 2021-04-16 DIAGNOSIS — Z79899 Other long term (current) drug therapy: Secondary | ICD-10-CM

## 2021-04-16 LAB — CBC WITH DIFFERENTIAL/PLATELET
Basophils Absolute: 0 10*3/uL (ref 0.0–0.1)
Basophils Relative: 0.9 % (ref 0.0–3.0)
Eosinophils Absolute: 0.1 10*3/uL (ref 0.0–0.7)
Eosinophils Relative: 1.8 % (ref 0.0–5.0)
HCT: 40.9 % (ref 36.0–46.0)
Hemoglobin: 13.7 g/dL (ref 12.0–15.0)
Lymphocytes Relative: 30.2 % (ref 12.0–46.0)
Lymphs Abs: 1.6 10*3/uL (ref 0.7–4.0)
MCHC: 33.5 g/dL (ref 30.0–36.0)
MCV: 91.9 fl (ref 78.0–100.0)
Monocytes Absolute: 0.3 10*3/uL (ref 0.1–1.0)
Monocytes Relative: 6.1 % (ref 3.0–12.0)
Neutro Abs: 3.2 10*3/uL (ref 1.4–7.7)
Neutrophils Relative %: 61 % (ref 43.0–77.0)
Platelets: 289 10*3/uL (ref 150.0–400.0)
RBC: 4.45 Mil/uL (ref 3.87–5.11)
RDW: 12.7 % (ref 11.5–15.5)
WBC: 5.3 10*3/uL (ref 4.0–10.5)

## 2021-04-16 LAB — COMPREHENSIVE METABOLIC PANEL
ALT: 11 U/L (ref 0–35)
AST: 11 U/L (ref 0–37)
Albumin: 4.1 g/dL (ref 3.5–5.2)
Alkaline Phosphatase: 49 U/L (ref 39–117)
BUN: 11 mg/dL (ref 6–23)
CO2: 31 mEq/L (ref 19–32)
Calcium: 8.7 mg/dL (ref 8.4–10.5)
Chloride: 103 mEq/L (ref 96–112)
Creatinine, Ser: 0.94 mg/dL (ref 0.40–1.20)
GFR: 76.71 mL/min (ref >60.00)
Glucose, Bld: 97 mg/dL (ref 70–99)
Potassium: 4.8 mEq/L (ref 3.5–5.1)
Sodium: 136 mEq/L (ref 135–145)
Total Bilirubin: 0.3 mg/dL (ref 0.2–1.2)
Total Protein: 6.4 g/dL (ref 6.0–8.3)

## 2021-04-16 LAB — HEMOGLOBIN A1C: Hgb A1c MFr Bld: 5.4 % (ref 4.6–6.5)

## 2021-04-16 LAB — LIPID PANEL
Cholesterol: 109 mg/dL (ref 0–200)
HDL: 44 mg/dL (ref >39.00)
LDL Cholesterol: 54 mg/dL (ref 0–99)
NonHDL: 65.47
Total CHOL/HDL Ratio: 2
Triglycerides: 58 mg/dL (ref 0.0–149.0)
VLDL: 11.6 mg/dL (ref 0.0–40.0)

## 2021-04-16 LAB — TSH: TSH: 1.67 u[IU]/mL (ref 0.35–5.50)

## 2021-04-16 MED ORDER — SERTRALINE HCL 50 MG PO TABS
50.0000 mg | ORAL_TABLET | Freq: Every day | ORAL | 3 refills | Status: DC
  Filled 2021-04-16: qty 30, 30d supply, fill #0
  Filled 2021-05-18: qty 30, 30d supply, fill #1

## 2021-04-16 MED ORDER — WEGOVY 0.5 MG/0.5ML ~~LOC~~ SOAJ
0.5000 mg | SUBCUTANEOUS | 1 refills | Status: DC
  Filled 2021-04-16 – 2021-05-15 (×4): qty 2, 28d supply, fill #0
  Filled 2021-06-16: qty 2, 28d supply, fill #1

## 2021-04-16 MED ORDER — CYANOCOBALAMIN 1000 MCG/ML IJ SOLN
INTRAMUSCULAR | 0 refills | Status: DC
Start: 2021-04-16 — End: 2021-12-29
  Filled 2021-04-16: qty 6, 84d supply, fill #0
  Filled 2021-11-11: qty 4, 28d supply, fill #1

## 2021-04-16 MED ORDER — BUPROPION HCL ER (XL) 150 MG PO TB24
150.0000 mg | ORAL_TABLET | Freq: Every day | ORAL | 2 refills | Status: DC
  Filled 2021-04-16: qty 30, 30d supply, fill #0
  Filled 2021-05-18: qty 30, 30d supply, fill #1

## 2021-04-16 MED ORDER — AMPHETAMINE-DEXTROAMPHET ER 30 MG PO CP24
30.0000 mg | ORAL_CAPSULE | ORAL | 0 refills | Status: DC
  Filled 2021-04-16 – 2021-04-20 (×2): qty 30, 30d supply, fill #0

## 2021-04-16 NOTE — Assessment & Plan Note (Signed)
·   D/w pt exercise and diet   wegovy started   F/u 1 month

## 2021-04-16 NOTE — Assessment & Plan Note (Signed)
con't adderall  uds and contract utd

## 2021-04-16 NOTE — Progress Notes (Signed)
Subjective:   By signing my name below, I, Shehryar Baig, attest that this documentation has been prepared under the direction and in the presence of [name], DO  04/16/2021     Patient ID: Marisa Humphrey, female    DOB: [birth year], 40 y.o.   MRN: [id number]  Chief Complaint  Patient presents with   Annual Exam    Pt states fasting. Pt states getting pap and mamm next week with GYN. Pt would like to discuss restarting Wellbutrin and Zoloft    HPI Patient is in today for a comprehensive physical exam.  She is requesting a refill on 30 mg adderall xr daily PO and 1000 mcg/ml cyanocobalamin injection.  She is requesting to take Wellbutrin to manage again to help with smoking cessation. She is requesting to take weight loss medication.  She denies having any fever, ear pain, muscle pain, joint pain, new moles, congestion, sinus pain, sore throat, eye pain, chest pain, palpations, cough, SOB, wheezing, n/v/d, constipation, blood in stool, dysuria, frequency, hematuria, or headaches at this time. She has no recent changes to her family medical history. She is smoking 5-6 cigarettes daily. She first started smoking at age 58.  She is UTD on dental care. She is due for vision care.  She is not participating in regular exercise but is planning on incorporating more in her daily routine.  She has an upcomming appointment with her GYN and is receiving a mammogram during that visit.    Past Medical History:  Diagnosis Date   ADD (attention deficit disorder)    Anxiety    Depressed    Depression    Edema of both lower legs    Foot pain    GERD (gastroesophageal reflux disease)    Headache    History of condyloma acuminatum    Joint pain    Knee pain    Sleep apnea     Past Surgical History:  Procedure Laterality Date   EXCISION BONE CYST Left 06/05/2019   Procedure: EXCISION BONE CYST;  Surgeon: Betha Loa, MD;  Location: [facility];  Service:  Orthopedics;  Laterality: Left;   LAPAROSCOPIC GASTRIC BANDING  11/09/2005   Dr.Martin   MOLE REMOVAL     OPEN REDUCTION INTERNAL FIXATION (ORIF) FINGER WITH RADIAL BONE GRAFT Left 06/05/2019   Procedure: OPEN REDUCTION PIN FIXATION, CURRETAGE BONE LESION WITH CANCELLOUS CHIP BONE GRAFTING;  Surgeon: Betha Loa, MD;  Location: [facility];  Service: Orthopedics;  Laterality: Left;    Family History  Problem Relation Age of Onset   Heart attack Father 46   Heart disease Father 24       MI   High Cholesterol Father    Sudden death Father    Alcoholism Father    Diabetes Mother    Hypertension Mother    Hyperlipidemia Mother    Depression Mother    Anxiety disorder Mother    Bipolar disorder Mother    Alcoholism Mother    Cancer Other        breast   Diabetes Maternal Grandmother    Multiple sclerosis Brother     Social History   Socioeconomic History   Marital status: Married    Spouse name: [name]   Number of children: 1   Years of education: Not on file   Highest education level: Not on file  Occupational History   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    Employer: [employer]  Employer: [employer]  Tobacco Use   Smoking status: Every Day    Packs/day: 0.50    Years: 24.00    Pack years: 12.00    Types: Cigarettes   Smokeless tobacco: Never  Vaping Use   Vaping Use: Never used  Substance and Sexual Activity   Alcohol use: No   Drug use: No   Sexual activity: Yes    Partners: Male  Other Topics Concern   Not on file  Social History Narrative   Exercising --no   Social Determinants of Health   Financial Resource Strain: Not on file  Food Insecurity: Not on file  Transportation Needs: Not on file  Physical Activity: Not on file  Stress: Not on file  Social Connections: Not on file  Intimate Partner Violence: Not on file    Outpatient Medications Prior to Visit  Medication Sig Dispense Refill   acetaminophen (TYLENOL) 500 MG  tablet Take 1,000 mg by mouth every 8 (eight) hours as needed.     ALPRAZolam (XANAX) 0.25 MG tablet Take 1 tablet by mouth 2 times daily as needed for anxiety. 20 tablet 0   amphetamine-dextroamphetamine (ADDERALL XR) 30 MG 24 hr capsule Take 1 capsule (30 mg total) by mouth every morning. 30 capsule 0   amphetamine-dextroamphetamine (ADDERALL XR) 30 MG 24 hr capsule Take 1 capsule (30 mg total) by mouth every morning 30 capsule 0   buPROPion (WELLBUTRIN XL) 300 MG 24 hr tablet Take 1 tablet (300 mg total) by mouth daily. 90 tablet 0   ibuprofen (ADVIL) 800 MG tablet Take 800 mg by mouth every 8 (eight) hours as needed.     levonorgestrel (MIRENA) 20 MCG/24HR IUD 1 each by Intrauterine route once.     Syringe/Needle, Disp, 27G X 5/8" 1 ML MISC Use to give B12 injections 50 each 0   Vitamin D, Ergocalciferol, (DRISDOL) 1.25 MG (50000 UNIT) CAPS capsule TAKE 1 CAPSULE BY MOUTH EVERY SEVEN DAYS. 4 capsule 0   amphetamine-dextroamphetamine (ADDERALL XR) 30 MG 24 hr capsule Take 1 capsule (30 mg total) by mouth every morning. 30 capsule 0   cyanocobalamin (,VITAMIN B-12,) 1000 MCG/ML injection INJECT 1 ML (1,000 MCG TOTAL) INTO THE MUSCLE EVERY 14 (FOURTEEN) DAYS. 10 mL 0   COVID-19 At Home Antigen Test (CARESTART COVID-19 HOME TEST) KIT Use as directed 4 each 0   COVID-19 At Home Antigen Test (CARESTART COVID-19 HOME TEST) KIT Use as directed (Patient not taking: Reported on 04/16/2021) 4 each 0   doxycycline (VIBRA-TABS) 100 MG tablet Take 1 tablet (100 mg total) by mouth 2 (two) times daily. 20 tablet 0   meloxicam (MOBIC) 15 MG tablet Take 1 tablet (15 mg total) by mouth daily. (Patient not taking: Reported on 04/16/2021) 30 tablet 0   No facility-administered medications prior to visit.    Allergies  Allergen Reactions   Penicillins Rash    Amoxil has been taken w/o reaction    Review of Systems  Constitutional:  Negative for fever.  HENT:  Negative for congestion and sore throat.    Respiratory:  Negative for cough, shortness of breath and wheezing.   Cardiovascular:  Negative for chest pain.  Gastrointestinal:  Negative for blood in stool, constipation, diarrhea, nausea and vomiting.  Genitourinary:  Negative for dysuria, frequency and hematuria.  Musculoskeletal:  Negative for joint pain and myalgias.  Skin:        (-)New moles  Neurological:  Negative for headaches.      Objective:  Physical Exam Constitutional:      General: She is not in acute distress.    Appearance: Normal appearance. She is not ill-appearing.  HENT:     Head: Normocephalic and atraumatic.     Right Ear: Tympanic membrane, ear canal and external ear normal.     Left Ear: Tympanic membrane, ear canal and external ear normal.  Eyes:     Extraocular Movements: Extraocular movements intact.     Pupils: Pupils are equal, round, and reactive to light.  Cardiovascular:     Rate and Rhythm: Normal rate and regular rhythm.     Heart sounds: Normal heart sounds. No murmur heard.   No gallop.  Pulmonary:     Effort: Pulmonary effort is normal. No respiratory distress.     Breath sounds: Normal breath sounds. No wheezing or rales.  Abdominal:     General: Bowel sounds are normal. There is no distension.     Palpations: Abdomen is soft.     Tenderness: There is no abdominal tenderness. There is no guarding.  Skin:    General: Skin is warm and dry.  Neurological:     Mental Status: She is alert and oriented to person, place, and time.  Psychiatric:        Behavior: Behavior normal.        Judgment: Judgment normal.    BP 130/90 (BP Location: Left Arm, Patient Position: Sitting, Cuff Size: Large)    Pulse 80    Temp 98.5 F (36.9 C) (Oral)    Resp 18    Ht $R'5\' 7"'vN$  (1.702 m)    Wt 280 lb (127 kg)    SpO2 100%    BMI 43.85 kg/m  Wt Readings from Last 3 Encounters:  04/16/21 280 lb (127 kg)  09/16/20 278 lb 9.6 oz (126.4 kg)  07/02/20 271 lb (122.9 kg)    Diabetic Foot Exam - Simple    No data filed    Lab Results  Component Value Date   WBC 8.6 03/20/2020   HGB 15.2 03/20/2020   HCT 46.2 03/20/2020   PLT 354 03/20/2020   GLUCOSE 93 03/20/2020   CHOL 123 03/20/2020   TRIG 61 03/20/2020   HDL 47 03/20/2020   LDLCALC 63 03/20/2020   ALT 13 03/20/2020   AST 14 03/20/2020   NA 139 03/20/2020   K 4.7 03/20/2020   CL 102 03/20/2020   CREATININE 0.87 03/20/2020   BUN 9 03/20/2020   CO2 23 03/20/2020   TSH 1.600 03/20/2020   HGBA1C 5.0 03/20/2020    Lab Results  Component Value Date   TSH 1.600 03/20/2020   Lab Results  Component Value Date   WBC 8.6 03/20/2020   HGB 15.2 03/20/2020   HCT 46.2 03/20/2020   MCV 92 03/20/2020   PLT 354 03/20/2020   Lab Results  Component Value Date   NA 139 03/20/2020   K 4.7 03/20/2020   CO2 23 03/20/2020   GLUCOSE 93 03/20/2020   BUN 9 03/20/2020   CREATININE 0.87 03/20/2020   BILITOT 0.3 03/20/2020   ALKPHOS 62 03/20/2020   AST 14 03/20/2020   ALT 13 03/20/2020   PROT 6.8 03/20/2020   ALBUMIN 4.5 03/20/2020   CALCIUM 9.2 03/20/2020   ANIONGAP 7 01/20/2015   GFR 72.14 12/27/2014   Lab Results  Component Value Date   CHOL 123 03/20/2020   Lab Results  Component Value Date   HDL 47 03/20/2020   Lab Results  Component  Value Date   LDLCALC 63 03/20/2020   Lab Results  Component Value Date   TRIG 61 03/20/2020   Lab Results  Component Value Date   CHOLHDL 2.9 11/29/2019   Lab Results  Component Value Date   HGBA1C 5.0 03/20/2020   Pap smear- Last completed 10/10/2018.      Assessment & Plan:   Problem List Items Addressed This Visit       Unprioritized   Morbid obesity ([place]) (Chronic)    D/w pt exercise and diet  wegovy started  F/u 1 month      Relevant Medications   amphetamine-dextroamphetamine (ADDERALL XR) 30 MG 24 hr capsule   Semaglutide-Weight Management (WEGOVY) 0.5 MG/0.5ML SOAJ   Other Relevant Orders   CBC with Differential/Platelet   Comprehensive metabolic panel    Lipid panel   Hemoglobin A1c   TSH   Insulin, random   Attention deficit   Relevant Medications   amphetamine-dextroamphetamine (ADDERALL XR) 30 MG 24 hr capsule   Attention deficit 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    con't adderall  uds and contract utd      Depression with anxiety    Restart zoloft and wellbutrin  F/u 1 month      Relevant Medications   sertraline (ZOLOFT) 50 MG tablet   buPROPion (WELLBUTRIN XL) 150 MG 24 hr tablet   Other Visit Diagnoses     Preventative health care    -  Primary   Relevant Orders   CBC with Differential/Platelet   Comprehensive metabolic panel   Lipid panel   Hemoglobin A1c   TSH   Insulin, random   B12 deficiency       Relevant Medications   cyanocobalamin (,VITAMIN B-12,) 1000 MCG/ML injection   Anxiety       Relevant Medications   sertraline (ZOLOFT) 50 MG tablet   buPROPion (WELLBUTRIN XL) 150 MG 24 hr tablet   High risk medication use       Relevant Orders   Drug Monitoring Panel [phone] , Urine        Meds ordered this encounter  Medications   amphetamine-dextroamphetamine (ADDERALL XR) 30 MG 24 hr capsule    Sig: Take 1 capsule (30 mg total) by mouth every morning.    Dispense:  30 capsule    Refill:  0   cyanocobalamin (,VITAMIN B-12,) 1000 MCG/ML injection    Sig: INJECT 1 ML (1,000 MCG TOTAL) INTO THE MUSCLE EVERY 14 (FOURTEEN) DAYS.    Dispense:  10 mL    Refill:  0   sertraline (ZOLOFT) 50 MG tablet    Sig: Take 1 tablet (50 mg total) by mouth daily.    Dispense:  30 tablet    Refill:  3   buPROPion (WELLBUTRIN XL) 150 MG 24 hr tablet    Sig: Take 1 tablet (150 mg total) by mouth daily.    Dispense:  30 tablet    Refill:  2   Semaglutide-Weight Management (WEGOVY) 0.5 MG/0.5ML SOAJ    Sig: Inject 0.5 mg into the skin once a week.    Dispense:  2 mL    Refill:  1    I, Ann Held, DO, personally preformed the services described in this documentation.  All medical record entries  made by the scribe were at my direction and in my presence.  I have reviewed the chart and discharge instructions (if applicable) and agree that the record reflects my personal performance and is accurate and complete.  04/16/2021   I,Shehryar Baig,acting as a scribe for Ann Held, DO.,have documented all relevant documentation on the behalf of Ann Held, DO,as directed by  Ann Held, DO while in the presence of Ann Held, DO.   Ann Held, DO

## 2021-04-16 NOTE — Assessment & Plan Note (Signed)
Restart zoloft and wellbutrin  F/u 1 month

## 2021-04-16 NOTE — Patient Instructions (Signed)
Preventive Care 21-39 Years Old, Female °Preventive care refers to lifestyle choices and visits with your health care provider that can promote health and wellness. Preventive care visits are also called wellness exams. °What can I expect for my preventive care visit? °Counseling °During your preventive care visit, your health care provider may ask about your: °Medical history, including: °Past medical problems. °Family medical history. °Pregnancy history. °Current health, including: °Menstrual cycle. °Method of birth control. °Emotional well-being. °Home life and relationship well-being. °Sexual activity and sexual health. °Lifestyle, including: °Alcohol, nicotine or tobacco, and drug use. °Access to firearms. °Diet, exercise, and sleep habits. °Work and work environment. °Sunscreen use. °Safety issues such as seatbelt and bike helmet use. °Physical exam °Your health care provider may check your: °Height and weight. These may be used to calculate your BMI (body mass index). BMI is a measurement that tells if you are at a healthy weight. °Waist circumference. This measures the distance around your waistline. This measurement also tells if you are at a healthy weight and may help predict your risk of certain diseases, such as type 2 diabetes and high blood pressure. °Heart rate and blood pressure. °Body temperature. °Skin for abnormal spots. °What immunizations do I need? °Vaccines are usually given at various ages, according to a schedule. Your health care provider will recommend vaccines for you based on your age, medical history, and lifestyle or other factors, such as travel or where you work. °What tests do I need? °Screening °Your health care provider may recommend screening tests for certain conditions. This may include: °Pelvic exam and Pap test. °Lipid and cholesterol levels. °Diabetes screening. This is done by checking your blood sugar (glucose) after you have not eaten for a while (fasting). °Hepatitis B  test. °Hepatitis C test. °HIV (human immunodeficiency virus) test. °STI (sexually transmitted infection) testing, if you are at risk. °BRCA-related cancer screening. This may be done if you have a family history of breast, ovarian, tubal, or peritoneal cancers. °Talk with your health care provider about your test results, treatment options, and if necessary, the need for more tests. °Follow these instructions at home: °Eating and drinking ° °Eat a healthy diet that includes fresh fruits and vegetables, whole grains, lean protein, and low-fat dairy products. °Take vitamin and mineral supplements as recommended by your health care provider. °Do not drink alcohol if: °Your health care provider tells you not to drink. °You are pregnant, may be pregnant, or are planning to become pregnant. °If you drink alcohol: °Limit how much you have to 0-1 drink a day. °Know how much alcohol is in your drink. In the [place] one drink equals one 12 oz bottle of beer (355 mL), one 5 oz glass of wine (148 mL), or one 1½ oz glass of hard liquor (44 mL). °Lifestyle °Brush your teeth every morning and night with fluoride toothpaste. Floss one time each day. °Exercise for at least 30 minutes 5 or more days each week. °Do not use any products that contain nicotine or tobacco. These products include cigarettes, chewing tobacco, and vaping devices, such as e-cigarettes. If you need help quitting, ask your health care provider. °Do not use drugs. °If you are sexually active, practice safe sex. Use a condom or other form of protection to prevent STIs. °If you do not wish to become pregnant, use a form of birth control. If you plan to become pregnant, see your health care provider for a prepregnancy visit. °Find healthy ways to manage stress, such as: °Meditation, yoga,   or listening to music. °Journaling. °Talking to a trusted person. °Spending time with friends and family. °Minimize exposure to UV radiation to reduce your risk of skin  cancer. °Safety °Always wear your seat belt while driving or riding in a vehicle. °Do not drive: °If you have been drinking alcohol. Do not ride with someone who has been drinking. °If you have been using any mind-altering substances or drugs. °While texting. °When you are tired or distracted. °Wear a helmet and other protective equipment during sports activities. °If you have firearms in your house, make sure you follow all gun safety procedures. °Seek help if you have been physically or sexually abused. °What's next? °Go to your health care provider once a year for an annual wellness visit. °Ask your health care provider how often you should have your eyes and teeth checked. °Stay up to date on all vaccines. °This information is not intended to replace advice given to you by your health care provider. Make sure you discuss any questions you have with your health care provider. °Document Revised: 08/13/2020 Document Reviewed: 08/13/2020 °Elsevier Patient Education © 2022 Elsevier Inc. ° °

## 2021-04-17 ENCOUNTER — Telehealth: Payer: No Typology Code available for payment source | Admitting: Emergency Medicine

## 2021-04-17 ENCOUNTER — Encounter: Payer: Self-pay | Admitting: Emergency Medicine

## 2021-04-17 DIAGNOSIS — J069 Acute upper respiratory infection, unspecified: Secondary | ICD-10-CM

## 2021-04-17 LAB — INSULIN, RANDOM: Insulin: 11.6 u[IU]/mL

## 2021-04-17 MED ORDER — PREDNISONE 10 MG (21) PO TBPK: ORAL_TABLET | Freq: Every day | ORAL | 0 refills | Status: DC

## 2021-04-17 MED ORDER — BENZONATATE 100 MG PO CAPS: 100.0000 mg | ORAL_CAPSULE | Freq: Two times a day (BID) | ORAL | 0 refills | Status: DC | PRN

## 2021-04-17 NOTE — Progress Notes (Signed)
We are sorry that you are not feeling well.  Here is how we plan to help!  Based on your presentation I believe you most likely have A cough due to a virus.  This is called viral bronchitis and is best treated by rest, plenty of fluids and control of the cough.  You may use Ibuprofen or Tylenol as directed to help your symptoms.     In addition you may use A prescription cough medication called Tessalon Perles 100mg . You may take 1-2 capsules every 8 hours as needed for your cough.  Prednisone 10 mg daily for 6 days (see taper instructions below) Instructions sent to pharmacy  From your responses in the eVisit questionnaire you describe inflammation in the upper respiratory tract which is causing a significant cough.  This is commonly called Bronchitis and has four common causes:   Allergies Viral Infections Acid Reflux Bacterial Infection Allergies, viruses and acid reflux are treated by controlling symptoms or eliminating the cause. An example might be a cough caused by taking certain blood pressure medications. You stop the cough by changing the medication. Another example might be a cough caused by acid reflux. Controlling the reflux helps control the cough.  USE OF BRONCHODILATOR ("RESCUE") INHALERS: There is a risk from using your bronchodilator too frequently.  The risk is that over-reliance on a medication which only relaxes the muscles surrounding the breathing tubes can reduce the effectiveness of medications prescribed to reduce swelling and congestion of the tubes themselves.  Although you feel brief relief from the bronchodilator inhaler, your asthma may actually be worsening with the tubes becoming more swollen and filled with mucus.  This can delay other crucial treatments, such as oral steroid medications. If you need to use a bronchodilator inhaler daily, several times per day, you should discuss this with your provider.  There are probably better treatments that could be used to  keep your asthma under control.     HOME CARE Only take medications as instructed by your medical team. Complete the entire course of an antibiotic. Drink plenty of fluids and get plenty of rest. Avoid close contacts especially the very young and the elderly Cover your mouth if you cough or cough into your sleeve. Always remember to wash your hands A steam or ultrasonic humidifier can help congestion.   GET HELP RIGHT AWAY IF: You develop worsening fever. You become short of breath You cough up blood. Your symptoms persist after you have completed your treatment plan MAKE SURE YOU  Understand these instructions. Will watch your condition. Will get help right away if you are not doing well or get worse.    Thank you for choosing an e-visit.  Your e-visit answers were reviewed by a board certified advanced clinical practitioner to complete your personal care plan. Depending upon the condition, your plan could have included both over the counter or prescription medications.  Please review your pharmacy choice. Make sure the pharmacy is open so you can pick up prescription now. If there is a problem, you may contact your provider through [employer] and have the prescription routed to another pharmacy.  Your safety is important to [place]. If you have drug allergies check your prescription carefully.   For the next 24 hours you can use MyChart to ask questions about today's visit, request a non-urgent call back, or ask for a work or school excuse. You will get an email in the next two days asking about your experience. I hope that  your e-visit has been valuable and will speed your recovery.

## 2021-04-17 NOTE — Progress Notes (Signed)
I have spent 5 minutes in review of e-visit questionnaire, review and updating patient chart, medical decision making and response to patient.   Luster Hechler, PA-C    

## 2021-04-18 LAB — DM TEMPLATE

## 2021-04-19 LAB — DRUG MONITORING PANEL 376104, URINE
Alphahydroxyalprazolam: 75 ng/mL — ABNORMAL HIGH (ref <25)
Alphahydroxymidazolam: NEGATIVE ng/mL (ref <50)
Alphahydroxytriazolam: NEGATIVE ng/mL (ref <50)
Aminoclonazepam: NEGATIVE ng/mL (ref <25)
Amphetamine: 11443 ng/mL — ABNORMAL HIGH (ref <250)
Amphetamines: POSITIVE ng/mL — AB (ref <500)
Barbiturates: NEGATIVE ng/mL (ref <300)
Benzodiazepines: POSITIVE ng/mL — AB (ref <100)
Cocaine Metabolite: NEGATIVE ng/mL (ref <150)
Desmethyltramadol: NEGATIVE ng/mL (ref <100)
Hydroxyethylflurazepam: NEGATIVE ng/mL (ref <50)
Lorazepam: NEGATIVE ng/mL (ref <50)
Methamphetamine: NEGATIVE ng/mL (ref <250)
Nordiazepam: NEGATIVE ng/mL (ref <50)
Opiates: NEGATIVE ng/mL (ref <100)
Oxazepam: NEGATIVE ng/mL (ref <50)
Oxycodone: NEGATIVE ng/mL (ref <100)
Temazepam: NEGATIVE ng/mL (ref <50)
Tramadol: NEGATIVE ng/mL (ref <100)

## 2021-04-19 LAB — DM TEMPLATE

## 2021-04-20 ENCOUNTER — Other Ambulatory Visit (HOSPITAL_COMMUNITY): Payer: Self-pay

## 2021-04-27 ENCOUNTER — Other Ambulatory Visit (HOSPITAL_COMMUNITY): Payer: Self-pay

## 2021-04-28 ENCOUNTER — Telehealth: Payer: Self-pay | Admitting: *Deleted

## 2021-04-28 NOTE — Telephone Encounter (Signed)
Prior auth started via cover my meds.  Awaiting determination.  Key: [id number] Humphrey

## 2021-04-29 ENCOUNTER — Other Ambulatory Visit (HOSPITAL_COMMUNITY): Payer: Self-pay

## 2021-04-29 MED ORDER — FOLIC ACID 1 MG PO TABS
1.0000 mg | ORAL_TABLET | Freq: Every day | ORAL | 11 refills | Status: DC
  Filled 2021-04-29: qty 30, 30d supply, fill #0
  Filled 2021-05-28: qty 30, 30d supply, fill #1
  Filled 2021-07-07: qty 30, 30d supply, fill #2
  Filled 2021-08-14: qty 30, 30d supply, fill #3
  Filled 2021-11-11: qty 30, 30d supply, fill #4

## 2021-04-30 NOTE — Telephone Encounter (Signed)
Message from plan: The request has been approved. The authorization is effective for a maximum of 3 fills from 04/30/2021 to 07/29/2021, as long as the member is enrolled in their current health plan. The request was approved as submitted. This request has been approved for a quantity of 7mL per 28 days.Additional authorizations have been entered for the following: Wegovy 0.25 mg/0.5 mL allowing 64mL per 28 days for 3 fills; please reference authorization [id number], Wegovy 1 mg/0.5 mL allowing 59mL per 28 days for 3 fills; please reference authorization [id number], Wegovy 1.7 mg/0.5 mL allowing 27mL per 28 days for 3 fills; please reference authorization [id number], Wegovy 2.4 mg/0.5 mL allowing 25mL per 28 days for 3 fills; please reference authorization [id number], effective 04/30/2021 through 07/29/2021. A written notification letter will follow with additional details. ?

## 2021-05-04 ENCOUNTER — Other Ambulatory Visit (HOSPITAL_COMMUNITY): Payer: Self-pay

## 2021-05-04 ENCOUNTER — Other Ambulatory Visit: Payer: Self-pay | Admitting: Family Medicine

## 2021-05-04 DIAGNOSIS — F419 Anxiety disorder, unspecified: Secondary | ICD-10-CM

## 2021-05-04 MED ORDER — ALPRAZOLAM 0.25 MG PO TABS
0.2500 mg | ORAL_TABLET | Freq: Two times a day (BID) | ORAL | 0 refills | Status: DC | PRN
  Filled 2021-05-04: qty 20, 10d supply, fill #0

## 2021-05-04 NOTE — Telephone Encounter (Signed)
Last OV--04/16/2021 ?No upcoming appt. Scheduled. ?Last Refill--#20 no refills on 04/07/2021 ?UDS/CSC--done on 09/16/2020 ?

## 2021-05-15 ENCOUNTER — Other Ambulatory Visit (HOSPITAL_COMMUNITY): Payer: Self-pay

## 2021-05-18 ENCOUNTER — Other Ambulatory Visit (HOSPITAL_COMMUNITY): Payer: Self-pay

## 2021-05-18 ENCOUNTER — Other Ambulatory Visit: Payer: Self-pay | Admitting: Family Medicine

## 2021-05-18 DIAGNOSIS — R41840 Attention and concentration deficit: Secondary | ICD-10-CM

## 2021-05-18 DIAGNOSIS — E559 Vitamin D deficiency, unspecified: Secondary | ICD-10-CM

## 2021-05-18 MED ORDER — VITAMIN D (ERGOCALCIFEROL) 1.25 MG (50000 UNIT) PO CAPS
ORAL_CAPSULE | ORAL | 0 refills | Status: DC
  Filled 2021-05-18: qty 4, 28d supply, fill #0

## 2021-05-18 MED ORDER — AMPHETAMINE-DEXTROAMPHET ER 30 MG PO CP24
30.0000 mg | ORAL_CAPSULE | ORAL | 0 refills | Status: DC
  Filled 2021-05-18: qty 30, 30d supply, fill #0

## 2021-05-18 NOTE — Telephone Encounter (Signed)
Requesting:  Adderall XR '30mg'$  ?Contract: 09/16/20 ?UDS: 09/16/20 ?Last Visit: 04/16/21 ?Next Visit: none ?Last Refill: 03/20/21 ? ?For vit d it was last checked by Dr. Juleen China at [facility] on 03/20/20 and was 23.8.  Do you want to continue this? ? ?Please Advise ? ?

## 2021-05-19 ENCOUNTER — Other Ambulatory Visit: Payer: Self-pay | Admitting: Family Medicine

## 2021-05-19 ENCOUNTER — Other Ambulatory Visit (HOSPITAL_COMMUNITY): Payer: Self-pay

## 2021-05-19 ENCOUNTER — Encounter: Payer: Self-pay | Admitting: Family Medicine

## 2021-05-19 DIAGNOSIS — F418 Other specified anxiety disorders: Secondary | ICD-10-CM

## 2021-05-19 MED ORDER — SERTRALINE HCL 100 MG PO TABS
100.0000 mg | ORAL_TABLET | Freq: Every day | ORAL | 3 refills | Status: DC
  Filled 2021-05-19: qty 90, 90d supply, fill #0

## 2021-05-20 ENCOUNTER — Other Ambulatory Visit (HOSPITAL_COMMUNITY): Payer: Self-pay

## 2021-05-28 ENCOUNTER — Other Ambulatory Visit: Payer: Self-pay | Admitting: Family Medicine

## 2021-05-28 DIAGNOSIS — F419 Anxiety disorder, unspecified: Secondary | ICD-10-CM

## 2021-05-29 ENCOUNTER — Telehealth: Payer: Self-pay

## 2021-05-29 ENCOUNTER — Encounter: Payer: Self-pay | Admitting: Family Medicine

## 2021-05-29 ENCOUNTER — Other Ambulatory Visit: Payer: Self-pay | Admitting: Family Medicine

## 2021-05-29 ENCOUNTER — Other Ambulatory Visit (HOSPITAL_COMMUNITY): Payer: Self-pay

## 2021-05-29 DIAGNOSIS — F419 Anxiety disorder, unspecified: Secondary | ICD-10-CM

## 2021-05-29 MED ORDER — ALPRAZOLAM 0.25 MG PO TABS
0.2500 mg | ORAL_TABLET | Freq: Two times a day (BID) | ORAL | 0 refills | Status: DC | PRN
  Filled 2021-05-29: qty 20, 10d supply, fill #0

## 2021-05-29 NOTE — Telephone Encounter (Signed)
Requesting: Alprazolam  ?Contract: 09/16/2020 ?UDS: 04/16/14/2023 ?Last Visit: 04/16/2021 ?Next Visit: none pending ?Last Refill: 05/04/2021 ? ?Please Advise ? ?

## 2021-06-16 ENCOUNTER — Other Ambulatory Visit (HOSPITAL_COMMUNITY): Payer: Self-pay

## 2021-06-16 ENCOUNTER — Other Ambulatory Visit: Payer: Self-pay | Admitting: Family Medicine

## 2021-06-16 DIAGNOSIS — R41840 Attention and concentration deficit: Secondary | ICD-10-CM

## 2021-06-16 DIAGNOSIS — F419 Anxiety disorder, unspecified: Secondary | ICD-10-CM

## 2021-06-16 NOTE — Telephone Encounter (Signed)
Requesting: Adderall XR '30mg'$  and alprazolam ?Contract: 09/16/20 ?UDS: 04/16/21 ?Last Visit: 04/16/21 ?Next Visit: none ?Last Refill: Adderall xr 05/18/21 and alprazolam 05/29/21 ? ?Please Advise ? ?

## 2021-06-17 ENCOUNTER — Other Ambulatory Visit (HOSPITAL_COMMUNITY): Payer: Self-pay

## 2021-06-17 MED ORDER — ALPRAZOLAM 0.25 MG PO TABS
0.2500 mg | ORAL_TABLET | Freq: Two times a day (BID) | ORAL | 0 refills | Status: DC | PRN
  Filled 2021-06-17: qty 20, 10d supply, fill #0

## 2021-06-17 MED ORDER — AMPHETAMINE-DEXTROAMPHET ER 30 MG PO CP24
30.0000 mg | ORAL_CAPSULE | ORAL | 0 refills | Status: DC
  Filled 2021-06-17: qty 30, 30d supply, fill #0

## 2021-06-18 ENCOUNTER — Other Ambulatory Visit (HOSPITAL_COMMUNITY): Payer: Self-pay

## 2021-07-06 ENCOUNTER — Encounter: Payer: Self-pay | Admitting: Family Medicine

## 2021-07-06 DIAGNOSIS — F419 Anxiety disorder, unspecified: Secondary | ICD-10-CM

## 2021-07-07 ENCOUNTER — Other Ambulatory Visit: Payer: Self-pay | Admitting: Family Medicine

## 2021-07-07 ENCOUNTER — Other Ambulatory Visit (INDEPENDENT_AMBULATORY_CARE_PROVIDER_SITE_OTHER): Payer: Self-pay

## 2021-07-07 ENCOUNTER — Other Ambulatory Visit (HOSPITAL_COMMUNITY): Payer: Self-pay

## 2021-07-07 DIAGNOSIS — E559 Vitamin D deficiency, unspecified: Secondary | ICD-10-CM

## 2021-07-07 NOTE — Telephone Encounter (Signed)
Would you like Pt to continue Ergocalciferol?  

## 2021-07-08 ENCOUNTER — Other Ambulatory Visit (HOSPITAL_COMMUNITY): Payer: Self-pay

## 2021-07-08 MED ORDER — VITAMIN D (ERGOCALCIFEROL) 1.25 MG (50000 UNIT) PO CAPS
50000.0000 [IU] | ORAL_CAPSULE | ORAL | 0 refills | Status: DC
  Filled 2021-07-08: qty 4, 28d supply, fill #0

## 2021-07-08 MED ORDER — BUPROPION HCL ER (XL) 150 MG PO TB24
450.0000 mg | ORAL_TABLET | Freq: Every day | ORAL | 3 refills | Status: DC
  Filled 2021-07-08: qty 270, 90d supply, fill #0
  Filled 2021-12-10: qty 270, 90d supply, fill #1

## 2021-07-10 ENCOUNTER — Other Ambulatory Visit (HOSPITAL_COMMUNITY): Payer: Self-pay

## 2021-07-14 ENCOUNTER — Other Ambulatory Visit (HOSPITAL_COMMUNITY): Payer: Self-pay

## 2021-07-14 ENCOUNTER — Other Ambulatory Visit: Payer: Self-pay | Admitting: Family Medicine

## 2021-07-14 MED ORDER — WEGOVY 1 MG/0.5ML ~~LOC~~ SOAJ
1.0000 mg | SUBCUTANEOUS | 0 refills | Status: DC
  Filled 2021-07-14: qty 2, 28d supply, fill #0

## 2021-07-15 ENCOUNTER — Other Ambulatory Visit: Payer: Self-pay | Admitting: Family Medicine

## 2021-07-15 ENCOUNTER — Other Ambulatory Visit (HOSPITAL_COMMUNITY): Payer: Self-pay

## 2021-07-15 DIAGNOSIS — R41840 Attention and concentration deficit: Secondary | ICD-10-CM

## 2021-07-15 DIAGNOSIS — F419 Anxiety disorder, unspecified: Secondary | ICD-10-CM

## 2021-07-15 NOTE — Telephone Encounter (Signed)
Requesting: Xanax ?Contract: 04/16/2021 ?UDS: 04/16/2021 ?Last OV: 04/16/2021 ?Next OV: N/A ?Last Refill: 06/17/2021, #20--0 RF ?Database: ? ? ?Requesting: Adderrall XR ?Contract: ?UDS: ?Last OV: ?Next OV:  ?Last Refill: 06/17/2021, #30--0 RF ?Database: ? ? ?Please advise  ? ? ?

## 2021-07-16 ENCOUNTER — Other Ambulatory Visit (HOSPITAL_COMMUNITY): Payer: Self-pay

## 2021-07-16 MED ORDER — AMPHETAMINE-DEXTROAMPHET ER 30 MG PO CP24
30.0000 mg | ORAL_CAPSULE | ORAL | 0 refills | Status: DC
  Filled 2021-07-16: qty 30, 30d supply, fill #0

## 2021-07-16 MED ORDER — ALPRAZOLAM 0.25 MG PO TABS
0.2500 mg | ORAL_TABLET | Freq: Two times a day (BID) | ORAL | 0 refills | Status: DC | PRN
  Filled 2021-07-16: qty 20, 10d supply, fill #0

## 2021-08-07 ENCOUNTER — Other Ambulatory Visit (HOSPITAL_COMMUNITY): Payer: Self-pay

## 2021-08-07 ENCOUNTER — Other Ambulatory Visit: Payer: Self-pay | Admitting: Family Medicine

## 2021-08-07 DIAGNOSIS — F419 Anxiety disorder, unspecified: Secondary | ICD-10-CM

## 2021-08-07 MED ORDER — ALPRAZOLAM 0.25 MG PO TABS
0.2500 mg | ORAL_TABLET | Freq: Two times a day (BID) | ORAL | 0 refills | Status: DC | PRN
  Filled 2021-08-07: qty 20, 10d supply, fill #0

## 2021-08-07 NOTE — Telephone Encounter (Signed)
Requesting:xanax 0.25 mg Contract:09/16/20 UDS:04/16/21 Last Visit:04/16/21 Next Visit:unknown Last Refill:07/16/21  Please Advise

## 2021-08-14 ENCOUNTER — Other Ambulatory Visit: Payer: Self-pay | Admitting: Family Medicine

## 2021-08-14 ENCOUNTER — Other Ambulatory Visit (HOSPITAL_COMMUNITY): Payer: Self-pay

## 2021-08-14 DIAGNOSIS — E559 Vitamin D deficiency, unspecified: Secondary | ICD-10-CM

## 2021-08-14 DIAGNOSIS — R41840 Attention and concentration deficit: Secondary | ICD-10-CM

## 2021-08-14 MED ORDER — VITAMIN D (ERGOCALCIFEROL) 1.25 MG (50000 UNIT) PO CAPS
50000.0000 [IU] | ORAL_CAPSULE | ORAL | 0 refills | Status: DC
  Filled 2021-08-14: qty 4, 28d supply, fill #0

## 2021-08-14 MED ORDER — AMPHETAMINE-DEXTROAMPHET ER 30 MG PO CP24
30.0000 mg | ORAL_CAPSULE | ORAL | 0 refills | Status: DC
  Filled 2021-08-14: qty 30, 30d supply, fill #0

## 2021-08-14 NOTE — Telephone Encounter (Signed)
Requesting: Adderall XR '30mg'$   Contract: 09/16/20 UDS: 04/16/21 Last Visit: 04/16/21 Next Visit: None Last Refill: 07/16/21 #30 and 0RF  Please Advise

## 2021-09-09 ENCOUNTER — Other Ambulatory Visit (HOSPITAL_COMMUNITY): Payer: Self-pay

## 2021-09-09 ENCOUNTER — Telehealth: Payer: Self-pay | Admitting: Family Medicine

## 2021-09-09 DIAGNOSIS — R41840 Attention and concentration deficit: Secondary | ICD-10-CM

## 2021-09-09 DIAGNOSIS — F419 Anxiety disorder, unspecified: Secondary | ICD-10-CM

## 2021-09-09 MED ORDER — ALPRAZOLAM 0.25 MG PO TABS
0.2500 mg | ORAL_TABLET | Freq: Two times a day (BID) | ORAL | 0 refills | Status: DC | PRN
  Filled 2021-09-09: qty 20, 10d supply, fill #0

## 2021-09-09 MED ORDER — AMPHETAMINE-DEXTROAMPHET ER 30 MG PO CP24
30.0000 mg | ORAL_CAPSULE | ORAL | 0 refills | Status: DC
  Filled 2021-09-09 – 2021-09-15 (×2): qty 30, 30d supply, fill #0

## 2021-09-09 NOTE — Telephone Encounter (Signed)
Requesting: alprazolam 0.'25mg'$  and Adderall XR '30mg'$   Contract: 09/16/20 UDS: 04/16/21 Last Visit: 04/16/21 Next Visit: None Last Refill on alprazolam 0.'25mg'$  :08/07/21 #20 and 0RF Last Refill on Adderall XR: 08/14/21 #30 and 0RF  Please Advise

## 2021-09-09 NOTE — Telephone Encounter (Signed)
PDMP reviewed, slightly early for adderall  but we will go ahead and send the prescription.

## 2021-09-15 ENCOUNTER — Other Ambulatory Visit (HOSPITAL_COMMUNITY): Payer: Self-pay

## 2021-10-07 ENCOUNTER — Encounter (INDEPENDENT_AMBULATORY_CARE_PROVIDER_SITE_OTHER): Payer: Self-pay

## 2021-10-08 ENCOUNTER — Other Ambulatory Visit: Payer: Self-pay | Admitting: Family Medicine

## 2021-10-08 ENCOUNTER — Other Ambulatory Visit: Payer: Self-pay | Admitting: Internal Medicine

## 2021-10-08 DIAGNOSIS — E559 Vitamin D deficiency, unspecified: Secondary | ICD-10-CM

## 2021-10-08 DIAGNOSIS — F419 Anxiety disorder, unspecified: Secondary | ICD-10-CM

## 2021-10-08 MED ORDER — ALPRAZOLAM 0.25 MG PO TABS
0.2500 mg | ORAL_TABLET | Freq: Two times a day (BID) | ORAL | 0 refills | Status: DC | PRN
  Filled 2021-10-08: qty 20, 10d supply, fill #0

## 2021-10-08 NOTE — Telephone Encounter (Signed)
Requesting: xanax 0.25 mg Contract:09/16/20 UDS:04/16/21 Last Visit:04/16/21 Next Visit:unknown Last Refill:09/09/21  Please Advise

## 2021-10-09 ENCOUNTER — Other Ambulatory Visit (HOSPITAL_COMMUNITY): Payer: Self-pay

## 2021-10-13 ENCOUNTER — Other Ambulatory Visit (HOSPITAL_COMMUNITY): Payer: Self-pay

## 2021-10-13 MED ORDER — WEGOVY 1 MG/0.5ML ~~LOC~~ SOAJ
1.0000 mg | SUBCUTANEOUS | 0 refills | Status: DC
  Filled 2021-10-13: qty 2, 28d supply, fill #0

## 2021-10-13 MED ORDER — VITAMIN D (ERGOCALCIFEROL) 1.25 MG (50000 UNIT) PO CAPS
50000.0000 [IU] | ORAL_CAPSULE | ORAL | 0 refills | Status: DC
  Filled 2021-10-13: qty 4, 28d supply, fill #0

## 2021-10-14 ENCOUNTER — Other Ambulatory Visit (HOSPITAL_COMMUNITY): Payer: Self-pay

## 2021-10-14 ENCOUNTER — Other Ambulatory Visit: Payer: Self-pay | Admitting: Internal Medicine

## 2021-10-14 DIAGNOSIS — R41840 Attention and concentration deficit: Secondary | ICD-10-CM

## 2021-10-14 MED ORDER — AMPHETAMINE-DEXTROAMPHET ER 30 MG PO CP24
30.0000 mg | ORAL_CAPSULE | ORAL | 0 refills | Status: DC
  Filled 2021-10-14: qty 30, 30d supply, fill #0

## 2021-10-14 NOTE — Telephone Encounter (Signed)
Requesting: Adderall XR '30mg'$   Contract: 09/16/20 UDS: 04/16/21 Last Visit: 04/16/21 Next Visit: None Last Refill: 09/09/21 #30 and 0RF  Please Advise

## 2021-10-29 ENCOUNTER — Other Ambulatory Visit: Payer: Self-pay | Admitting: Family Medicine

## 2021-10-29 DIAGNOSIS — F419 Anxiety disorder, unspecified: Secondary | ICD-10-CM

## 2021-10-30 ENCOUNTER — Other Ambulatory Visit (HOSPITAL_COMMUNITY): Payer: Self-pay

## 2021-10-30 MED ORDER — ALPRAZOLAM 0.25 MG PO TABS
0.2500 mg | ORAL_TABLET | Freq: Two times a day (BID) | ORAL | 0 refills | Status: DC | PRN
  Filled 2021-10-30: qty 20, 10d supply, fill #0

## 2021-10-30 NOTE — Telephone Encounter (Signed)
Requesting: alprazolam 0.'25mg'$   Contract: 09/16/20 UDS: 04/16/21 Last Visit: 04/16/21 Next Visit: None Last Refill: 10/08/21 #20 and 0RF  Please Advise

## 2021-11-11 ENCOUNTER — Other Ambulatory Visit (HOSPITAL_COMMUNITY): Payer: Self-pay

## 2021-11-11 ENCOUNTER — Other Ambulatory Visit (INDEPENDENT_AMBULATORY_CARE_PROVIDER_SITE_OTHER): Payer: Self-pay

## 2021-11-11 ENCOUNTER — Other Ambulatory Visit: Payer: Self-pay | Admitting: Family Medicine

## 2021-11-11 DIAGNOSIS — R41840 Attention and concentration deficit: Secondary | ICD-10-CM

## 2021-11-11 DIAGNOSIS — E559 Vitamin D deficiency, unspecified: Secondary | ICD-10-CM

## 2021-11-11 MED ORDER — VITAMIN D (ERGOCALCIFEROL) 1.25 MG (50000 UNIT) PO CAPS
50000.0000 [IU] | ORAL_CAPSULE | ORAL | 0 refills | Status: DC
  Filled 2021-11-11: qty 4, 28d supply, fill #0

## 2021-11-11 NOTE — Telephone Encounter (Signed)
Requesting: Adderall XR '30mg'$   Contract:09/16/20 UDS: 04/16/21 Last Visit: 04/16/21 Next Visit: None Last Refill: 10/14/21 #30 and 0RF  Please Advise

## 2021-11-12 ENCOUNTER — Other Ambulatory Visit (HOSPITAL_COMMUNITY): Payer: Self-pay

## 2021-11-12 MED ORDER — AMPHETAMINE-DEXTROAMPHET ER 30 MG PO CP24
30.0000 mg | ORAL_CAPSULE | ORAL | 0 refills | Status: DC
  Filled 2021-11-12: qty 30, 30d supply, fill #0

## 2021-11-13 ENCOUNTER — Other Ambulatory Visit (HOSPITAL_COMMUNITY): Payer: Self-pay

## 2021-11-16 ENCOUNTER — Other Ambulatory Visit (HOSPITAL_COMMUNITY): Payer: Self-pay

## 2021-11-16 ENCOUNTER — Encounter: Payer: Self-pay | Admitting: Family Medicine

## 2021-11-16 MED ORDER — WEGOVY 1.7 MG/0.75ML ~~LOC~~ SOAJ
1.7000 mg | SUBCUTANEOUS | 0 refills | Status: DC
  Filled 2021-11-16 – 2021-12-07 (×3): qty 3, 28d supply, fill #0

## 2021-11-17 ENCOUNTER — Telehealth: Payer: Self-pay

## 2021-11-17 NOTE — Telephone Encounter (Signed)
PA initiated via Covermymeds; KEY: [id number]. Awaiting determination.

## 2021-11-24 ENCOUNTER — Other Ambulatory Visit (HOSPITAL_COMMUNITY): Payer: Self-pay

## 2021-11-24 ENCOUNTER — Other Ambulatory Visit: Payer: Self-pay | Admitting: Family Medicine

## 2021-11-24 DIAGNOSIS — F419 Anxiety disorder, unspecified: Secondary | ICD-10-CM

## 2021-11-24 MED ORDER — ALPRAZOLAM 0.25 MG PO TABS
0.2500 mg | ORAL_TABLET | Freq: Two times a day (BID) | ORAL | 0 refills | Status: DC | PRN
  Filled 2021-11-24: qty 20, 10d supply, fill #0

## 2021-11-24 NOTE — Telephone Encounter (Signed)
Requesting: alprazolam 0.'25mg'$   Contract:09/16/20 UDS:04/16/21 Last Visit: 04/16/21 Next Visit: None  Last Refill: 10/30/21 #20 and 0RF  Please Advise

## 2021-11-25 NOTE — Telephone Encounter (Signed)
Tried calling [employer] at [phone] to check status of PA. After holding >7 mins I hung up. Will have to try again later.

## 2021-11-27 ENCOUNTER — Other Ambulatory Visit (HOSPITAL_COMMUNITY): Payer: Self-pay

## 2021-11-27 MED ORDER — FLUCONAZOLE 150 MG PO TABS
150.0000 mg | ORAL_TABLET | ORAL | 0 refills | Status: DC
  Filled 2021-11-27: qty 3, 7d supply, fill #0

## 2021-11-27 MED ORDER — METRONIDAZOLE 0.75 % VA GEL
Freq: Every day | VAGINAL | 0 refills | Status: DC
  Filled 2021-11-27: qty 70, 5d supply, fill #0

## 2021-12-03 ENCOUNTER — Other Ambulatory Visit (HOSPITAL_BASED_OUTPATIENT_CLINIC_OR_DEPARTMENT_OTHER): Payer: Self-pay

## 2021-12-03 NOTE — Telephone Encounter (Signed)
Spoke w/ [employer]- they state they faxed additional questions to [place] on 11/19/21 but never received a response. They will be resending it to [place].

## 2021-12-04 NOTE — Telephone Encounter (Signed)
PA approved. Effective 12/03/21 to 12/03/22.

## 2021-12-07 ENCOUNTER — Other Ambulatory Visit (HOSPITAL_COMMUNITY): Payer: Self-pay

## 2021-12-10 ENCOUNTER — Other Ambulatory Visit: Payer: Self-pay | Admitting: Family Medicine

## 2021-12-10 ENCOUNTER — Other Ambulatory Visit (HOSPITAL_COMMUNITY): Payer: Self-pay

## 2021-12-10 DIAGNOSIS — R41840 Attention and concentration deficit: Secondary | ICD-10-CM

## 2021-12-10 MED ORDER — AMPHETAMINE-DEXTROAMPHET ER 30 MG PO CP24
30.0000 mg | ORAL_CAPSULE | ORAL | 0 refills | Status: DC
  Filled 2021-12-10 – 2021-12-11 (×2): qty 30, 30d supply, fill #0

## 2021-12-10 NOTE — Telephone Encounter (Signed)
Requesting: Adderall XR '30mg'$   Contract: 04/16/21 UDS: 04/16/21 Last Visit: 04/16/21 Next Visit: None Last Refill: 11/12/21 #30 and 0RF   Please Advise

## 2021-12-11 ENCOUNTER — Other Ambulatory Visit (HOSPITAL_COMMUNITY): Payer: Self-pay

## 2021-12-14 ENCOUNTER — Other Ambulatory Visit (HOSPITAL_COMMUNITY): Payer: Self-pay

## 2021-12-14 ENCOUNTER — Other Ambulatory Visit: Payer: Self-pay | Admitting: Family Medicine

## 2021-12-14 DIAGNOSIS — F419 Anxiety disorder, unspecified: Secondary | ICD-10-CM

## 2021-12-15 ENCOUNTER — Other Ambulatory Visit (HOSPITAL_COMMUNITY): Payer: Self-pay

## 2021-12-15 ENCOUNTER — Other Ambulatory Visit (INDEPENDENT_AMBULATORY_CARE_PROVIDER_SITE_OTHER): Payer: Self-pay

## 2021-12-21 ENCOUNTER — Other Ambulatory Visit (HOSPITAL_COMMUNITY): Payer: Self-pay

## 2021-12-21 ENCOUNTER — Other Ambulatory Visit (INDEPENDENT_AMBULATORY_CARE_PROVIDER_SITE_OTHER): Payer: Self-pay | Admitting: Family Medicine

## 2021-12-21 DIAGNOSIS — E538 Deficiency of other specified B group vitamins: Secondary | ICD-10-CM

## 2021-12-29 ENCOUNTER — Other Ambulatory Visit: Payer: Self-pay | Admitting: Family Medicine

## 2021-12-29 ENCOUNTER — Ambulatory Visit (INDEPENDENT_AMBULATORY_CARE_PROVIDER_SITE_OTHER): Payer: No Typology Code available for payment source | Admitting: Family Medicine

## 2021-12-29 ENCOUNTER — Encounter: Payer: Self-pay | Admitting: Family Medicine

## 2021-12-29 ENCOUNTER — Other Ambulatory Visit (HOSPITAL_COMMUNITY): Payer: Self-pay

## 2021-12-29 ENCOUNTER — Ambulatory Visit: Payer: No Typology Code available for payment source | Admitting: Family Medicine

## 2021-12-29 DIAGNOSIS — R41840 Attention and concentration deficit: Secondary | ICD-10-CM | POA: Diagnosis not present

## 2021-12-29 DIAGNOSIS — F419 Anxiety disorder, unspecified: Secondary | ICD-10-CM

## 2021-12-29 DIAGNOSIS — F418 Other specified anxiety disorders: Secondary | ICD-10-CM | POA: Diagnosis not present

## 2021-12-29 DIAGNOSIS — Z7251 High risk heterosexual behavior: Secondary | ICD-10-CM

## 2021-12-29 DIAGNOSIS — E538 Deficiency of other specified B group vitamins: Secondary | ICD-10-CM

## 2021-12-29 DIAGNOSIS — E559 Vitamin D deficiency, unspecified: Secondary | ICD-10-CM

## 2021-12-29 DIAGNOSIS — F988 Other specified behavioral and emotional disorders with onset usually occurring in childhood and adolescence: Secondary | ICD-10-CM

## 2021-12-29 DIAGNOSIS — F325 Major depressive disorder, single episode, in full remission: Secondary | ICD-10-CM

## 2021-12-29 MED ORDER — WEGOVY 2.4 MG/0.75ML ~~LOC~~ SOAJ
2.4000 mg | SUBCUTANEOUS | 3 refills | Status: DC
  Filled 2021-12-29 – 2022-01-08 (×2): qty 3, 28d supply, fill #0
  Filled 2022-02-25: qty 3, 28d supply, fill #1
  Filled 2022-03-22: qty 3, 28d supply, fill #2
  Filled 2022-04-28: qty 3, 28d supply, fill #3

## 2021-12-29 MED ORDER — BUPROPION HCL ER (XL) 150 MG PO TB24
450.0000 mg | ORAL_TABLET | Freq: Every day | ORAL | 3 refills | Status: DC
  Filled 2021-12-29 – 2022-03-15 (×2): qty 270, 90d supply, fill #0
  Filled 2022-07-22: qty 270, 90d supply, fill #1

## 2021-12-29 MED ORDER — SERTRALINE HCL 100 MG PO TABS
100.0000 mg | ORAL_TABLET | Freq: Every day | ORAL | 3 refills | Status: DC
  Filled 2021-12-29: qty 90, 90d supply, fill #0
  Filled 2022-05-21: qty 90, 90d supply, fill #1

## 2021-12-29 MED ORDER — VITAMIN D (ERGOCALCIFEROL) 1.25 MG (50000 UNIT) PO CAPS
50000.0000 [IU] | ORAL_CAPSULE | ORAL | 0 refills | Status: DC
  Filled 2021-12-29: qty 4, 28d supply, fill #0

## 2021-12-29 MED ORDER — ALPRAZOLAM 0.25 MG PO TABS
0.2500 mg | ORAL_TABLET | Freq: Two times a day (BID) | ORAL | 0 refills | Status: DC | PRN
  Filled 2021-12-29: qty 20, 10d supply, fill #0

## 2021-12-29 MED ORDER — AMPHETAMINE-DEXTROAMPHET ER 30 MG PO CP24
30.0000 mg | ORAL_CAPSULE | ORAL | 0 refills | Status: DC
  Filled 2021-12-29 – 2022-01-11 (×2): qty 30, 30d supply, fill #0

## 2021-12-29 MED ORDER — CYANOCOBALAMIN 1000 MCG/ML IJ SOLN
1000.0000 ug | INTRAMUSCULAR | 0 refills | Status: DC
  Filled 2021-12-29: qty 6, 84d supply, fill #0
  Filled 2022-05-07: qty 4, 56d supply, fill #1

## 2021-12-29 MED ORDER — "BD TB SYRINGE 27G X 1/2"" 1 ML MISC"
0 refills | Status: DC
  Filled 2021-12-29: qty 50, fill #0
  Filled 2022-03-15: qty 50, 50d supply, fill #0
  Filled 2022-03-22: qty 6, 84d supply, fill #0
  Filled 2022-05-07: qty 10, 10d supply, fill #0
  Filled 2022-05-21 – 2022-07-22 (×2): qty 10, 10d supply, fill #1

## 2021-12-29 NOTE — Assessment & Plan Note (Signed)
Stable on meds  

## 2021-12-29 NOTE — Patient Instructions (Signed)
Obesity, Adult Obesity is the condition of having too much total body fat. Being overweight or obese means that your weight is greater than what is considered healthy for your body size. Obesity is determined by a measurement called BMI (body mass index). BMI is an estimate of body fat and is calculated from height and weight. For adults, a BMI of 30 or higher is considered obese. Obesity can lead to other health concerns and major illnesses, including: Stroke. Coronary artery disease (CAD). Type 2 diabetes. Some types of cancer, including cancers of the colon, breast, uterus, and gallbladder. High blood pressure (hypertension). High cholesterol. Gallbladder stones. Obesity can also contribute to: Osteoarthritis. Sleep apnea. Infertility problems. What are the causes? Common causes of this condition include: Eating daily meals that are high in calories, sugar, and fat. Drinking high amounts of sugar-sweetened beverages, such as soft drinks. Being born with genes that may make you more likely to become obese. Having a medical condition that causes obesity, including: Hypothyroidism. Polycystic ovarian syndrome (PCOS). Binge-eating disorder. Cushing syndrome. Taking certain medicines, such as steroids, antidepressants, and seizure medicines. Not being physically active (sedentary lifestyle). Not getting enough sleep. What increases the risk? The following factors may make you more likely to develop this condition: Having a family history of obesity. Living in an area with limited access to: Parks, recreation centers, or sidewalks. Healthy food choices, such as grocery stores and farmers' markets. What are the signs or symptoms? The main sign of this condition is having too much body fat. How is this diagnosed? This condition is diagnosed based on: Your BMI. If you are an adult with a BMI of 30 or higher, you are considered obese. Your waist circumference. This measures the  distance around your waistline. Your skinfold thickness. Your health care provider may gently pinch a fold of your skin and measure it. You may have other tests to check for underlying conditions. How is this treated? Treatment for this condition often includes changing your lifestyle. Treatment may include some or all of the following: Dietary changes. This may include developing a healthy meal plan. Regular physical activity. This may include activity that causes your heart to beat faster (aerobic exercise) and strength training. Work with your health care provider to design an exercise program that works for you. Medicine to help you lose weight if you are unable to lose one pound a week after six weeks of healthy eating and more physical activity. Treating conditions that cause the obesity (underlying conditions). Surgery. Surgical options may include gastric banding and gastric bypass. Surgery may be done if: Other treatments have not helped to improve your condition. You have a BMI of 40 or higher. You have life-threatening health problems related to obesity. Follow these instructions at home: Eating and drinking  Follow recommendations from your health care provider about what you eat and drink. Your health care provider may advise you to: Limit fast food, sweets, and processed snack foods. Choose low-fat options, such as low-fat milk instead of whole milk. Eat five or more servings of fruits or vegetables every day. Choose healthy foods when you eat out. Keep low-fat snacks available. Limit sugary drinks, such as soda, fruit juice, sweetened iced tea, and flavored milk. Drink enough water to keep your urine pale yellow. Do not follow a fad diet. Fad diets can be unhealthy and even dangerous. Other healthful choices include: Eat at home more often. This gives you more control over what you eat. Learn to read food labels.   This will help you understand how much food is considered one  serving. Learn what a healthy serving size is. Physical activity Exercise regularly, as told by your health care provider. Most adults should get up to 150 minutes of moderate-intensity exercise every week. Ask your health care provider what types of exercise are safe for you and how often you should exercise. Warm up and stretch before being active. Cool down and stretch after being active. Rest between periods of activity. Lifestyle Work with your health care provider and a dietitian to set a weight-loss goal that is healthy and reasonable for you. Limit your screen time. Find ways to reward yourself that do not involve food. Do not drink alcohol if: Your health care provider tells you not to drink. You are pregnant, may be pregnant, or are planning to become pregnant. If you drink alcohol: Limit how much you have to: 0-1 drink a day for women. 0-2 drinks a day for men. Know how much alcohol is in your drink. In the [place] one drink equals one 12 oz bottle of beer (355 mL), one 5 oz glass of wine (148 mL), or one 1 oz glass of hard liquor (44 mL). General instructions Keep a weight-loss journal to keep track of the food you eat and how much exercise you get. Take over-the-counter and prescription medicines only as told by your health care provider. Take vitamins and supplements only as told by your health care provider. Consider joining a support group. Your health care provider may be able to recommend a support group. Pay attention to your mental health as obesity can lead to depression or self esteem issues. Keep all follow-up visits. This is important. Contact a health care provider if: You are unable to meet your weight-loss goal after six weeks of dietary and lifestyle changes. You have trouble breathing. Summary Obesity is the condition of having too much total body fat. Being overweight or obese means that your weight is greater than what is considered healthy for your body  size. Work with your health care provider and a dietitian to set a weight-loss goal that is healthy and reasonable for you. Exercise regularly, as told by your health care provider. Ask your health care provider what types of exercise are safe for you and how often you should exercise. This information is not intended to replace advice given to you by your health care provider. Make sure you discuss any questions you have with your health care provider. Document Revised: 09/23/2020 Document Reviewed: 09/23/2020 Elsevier Patient Education  2023 Else[employer] Inc.  

## 2021-12-29 NOTE — Assessment & Plan Note (Signed)
Inc wegovy to 2.4 mg weekly con't diet and exericse rto 3 months

## 2021-12-29 NOTE — Assessment & Plan Note (Signed)
Stable con't meds 

## 2021-12-29 NOTE — Progress Notes (Signed)
Subjective:   By signing my name below, I, Carylon Perches, attest that this documentation has been prepared under the direction and in the presence of Ann Held DO 12/29/2021   Patient ID: Marisa Humphrey, female    DOB: Jul 17, 1980, 41 y.o.   MRN: [id number]  Chief Complaint  Patient presents with   ADD   Depression   Anxiety   Follow-up    HPI Patient is in today for an office visit  She is requesting a refill of her medications  She is currently receiving Vitamin B12 injections. She does not notice a significant difference.   She is currently taking 1.7 Mg of Wegovy. She reports that she has lost about 20 lbs so far. She reports side effects of Constipation but she takes Miralax which alleviates the symptoms.  Wt Readings from Last 3 Encounters:  12/29/21 260 lb 3.2 oz (118 kg)  04/16/21 280 lb (127 kg)  09/16/20 278 lb 9.6 oz (126.4 kg)   She is requesting a STD test. She recently had an HIV test completed from her pap smear.   Past Medical History:  Diagnosis Date   ADD (attention deficit disorder)    Anxiety    Depressed    Depression    Edema of both lower legs    Foot pain    GERD (gastroesophageal reflux disease)    Headache    History of condyloma acuminatum    Joint pain    Knee pain    Sleep apnea     Past Surgical History:  Procedure Laterality Date   EXCISION BONE CYST Left 06/05/2019   Procedure: EXCISION BONE CYST;  Surgeon: Leanora Cover, MD;  Location: [facility];  Service: Orthopedics;  Laterality: Left;   LAPAROSCOPIC GASTRIC BANDING  11/09/2005   Dr.Martin   MOLE REMOVAL     OPEN REDUCTION INTERNAL FIXATION (ORIF) FINGER WITH RADIAL BONE GRAFT Left 06/05/2019   Procedure: OPEN REDUCTION PIN FIXATION, CURRETAGE BONE LESION WITH CANCELLOUS CHIP BONE GRAFTING;  Surgeon: Leanora Cover, MD;  Location: [place];  Service: Orthopedics;  Laterality: Left;    Family History  Problem Relation Age of Onset    Heart attack Father 22   Heart disease Father 34       MI   High Cholesterol Father    Sudden death Father    Alcoholism Father    Diabetes Mother    Hypertension Mother    Hyperlipidemia Mother    Depression Mother    Anxiety disorder Mother    Bipolar disorder Mother    Alcoholism Mother    Cancer Other        breast   Diabetes Maternal Grandmother    Multiple sclerosis Brother     Social History   Socioeconomic History   Marital status: Married    Spouse name: Gerald Stabs   Number of children: 1   Years of education: Not on file   Highest education level: Not on file  Occupational History   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    Employer: [employer]    Employer: [employer]  Tobacco Use   Smoking status: Every Day    Packs/day: 0.50    Years: 24.00    Total pack years: 12.00    Types: Cigarettes   Smokeless tobacco: Never  Vaping Use   Vaping Use: Never used  Substance and Sexual Activity   Alcohol use: No   Drug use: No  Sexual activity: Yes    Partners: Male  Other Topics Concern   Not on file  Social History Narrative   Exercising --no   Social Determinants of Health   Financial Resource Strain: Not on file  Food Insecurity: Not on file  Transportation Needs: Not on file  Physical Activity: Not on file  Stress: Not on file  Social Connections: Not on file  Intimate Partner Violence: Not on file    Outpatient Medications Prior to Visit  Medication Sig Dispense Refill   acetaminophen (TYLENOL) 500 MG tablet Take 1,000 mg by mouth every 8 (eight) hours as needed.     buPROPion (WELLBUTRIN XL) 300 MG 24 hr tablet Take 1 tablet (300 mg total) by mouth daily. 90 tablet 0   folic acid (FOLVITE) 1 MG tablet Take 1 tablet (1 mg total) by mouth daily. 30 tablet 11   ibuprofen (ADVIL) 800 MG tablet Take 800 mg by mouth every 8 (eight) hours as needed.     levonorgestrel (MIRENA) 20 MCG/24HR IUD 1 each by Intrauterine route once.     ALPRAZolam  (XANAX) 0.25 MG tablet Take 1 tablet (0.25 mg total) by mouth 2 (two) times daily as needed for anxiety. 20 tablet 0   amphetamine-dextroamphetamine (ADDERALL XR) 30 MG 24 hr capsule Take 1 capsule (30 mg total) by mouth every morning. 30 capsule 0   buPROPion (WELLBUTRIN XL) 150 MG 24 hr tablet Take 3 tablets (450 mg total) by mouth daily. 270 tablet 3   cyanocobalamin (VITAMIN B12) 1000 MCG/ML injection INJECT 1 ML (1,000 MCG TOTAL) INTO THE MUSCLE EVERY 14 (FOURTEEN) DAYS. 10 mL 0   Semaglutide-Weight Management (WEGOVY) 1.7 MG/0.75ML SOAJ Inject 1.7 mg into the skin once a week. 3 mL 0   sertraline (ZOLOFT) 100 MG tablet Take 1 tablet (100 mg total) by mouth daily. 90 tablet 3   Syringe/Needle, Disp, 27G X 5/8" 1 ML MISC Use to give B12 injections 50 each 0   Vitamin D, Ergocalciferol, (DRISDOL) 1.25 MG (50000 UNIT) CAPS capsule Take 1 capsule (50,000 Units total) by mouth every 7 (seven) days. 4 capsule 0   fluconazole (DIFLUCAN) 150 MG tablet Take 1 tablet (150 mg total) by mouth on days 1,4,7 as needed 3 tablet 0   metroNIDAZOLE (METROGEL) 0.75 % vaginal gel Insert 1 applicatorful vaginally at bedtime for 5 days 70 g 0   No facility-administered medications prior to visit.    Allergies  Allergen Reactions   Penicillins Rash    Amoxil has been taken w/o reaction    Review of Systems  Constitutional:  Negative for fever and malaise/fatigue.  HENT:  Negative for congestion.   Eyes:  Negative for blurred vision.  Respiratory:  Negative for shortness of breath.   Cardiovascular:  Negative for chest pain, palpitations and leg swelling.  Gastrointestinal:  Negative for abdominal pain, blood in stool and nausea.  Genitourinary:  Negative for dysuria and frequency.  Musculoskeletal:  Negative for falls.  Skin:  Negative for rash.  Neurological:  Negative for dizziness, loss of consciousness and headaches.  Endo/Heme/Allergies:  Negative for environmental allergies.   Psychiatric/Behavioral:  Negative for depression, hallucinations, memory loss, substance abuse and suicidal ideas. The patient is not nervous/anxious and does not have insomnia.        Objective:    Physical Exam Vitals and nursing note reviewed.  Constitutional:      General: She is not in acute distress.    Appearance: Normal appearance. She is not  ill-appearing.  HENT:     Head: Normocephalic and atraumatic.     Right Ear: External ear normal.     Left Ear: External ear normal.  Eyes:     Extraocular Movements: Extraocular movements intact.     Pupils: Pupils are equal, round, and reactive to light.  Cardiovascular:     Rate and Rhythm: Normal rate and regular rhythm.     Heart sounds: Normal heart sounds. No murmur heard.    No gallop.  Pulmonary:     Effort: Pulmonary effort is normal. No respiratory distress.     Breath sounds: Normal breath sounds. No wheezing or rales.  Abdominal:     General: There is no distension.     Palpations: Abdomen is soft.     Tenderness: There is no abdominal tenderness. There is no right CVA tenderness, left CVA tenderness, guarding or rebound.  Skin:    General: Skin is warm and dry.  Neurological:     Mental Status: She is alert and oriented to person, place, and time.  Psychiatric:        Judgment: Judgment normal.     BP 118/86 (BP Location: Left Arm, Patient Position: Sitting, Cuff Size: Normal)   Pulse 82   Temp 98 F (36.7 C) (Oral)   Resp 18   Ht '5\' 7"'$  (1.702 m)   Wt 260 lb 3.2 oz (118 kg)   SpO2 99%   BMI 40.75 kg/m  Wt Readings from Last 3 Encounters:  12/29/21 260 lb 3.2 oz (118 kg)  04/16/21 280 lb (127 kg)  09/16/20 278 lb 9.6 oz (126.4 kg)    Diabetic Foot Exam - Simple   No data filed    Lab Results  Component Value Date   WBC 5.3 04/16/2021   HGB 13.7 04/16/2021   HCT 40.9 04/16/2021   PLT 289.0 04/16/2021   GLUCOSE 97 04/16/2021   CHOL 109 04/16/2021   TRIG 58.0 04/16/2021   HDL 44.00 04/16/2021    LDLCALC 54 04/16/2021   ALT 11 04/16/2021   AST 11 04/16/2021   NA 136 04/16/2021   K 4.8 04/16/2021   CL 103 04/16/2021   CREATININE 0.94 04/16/2021   BUN 11 04/16/2021   CO2 31 04/16/2021   TSH 1.67 04/16/2021   HGBA1C 5.4 04/16/2021    Lab Results  Component Value Date   TSH 1.67 04/16/2021   Lab Results  Component Value Date   WBC 5.3 04/16/2021   HGB 13.7 04/16/2021   HCT 40.9 04/16/2021   MCV 91.9 04/16/2021   PLT 289.0 04/16/2021   Lab Results  Component Value Date   NA 136 04/16/2021   K 4.8 04/16/2021   CO2 31 04/16/2021   GLUCOSE 97 04/16/2021   BUN 11 04/16/2021   CREATININE 0.94 04/16/2021   BILITOT 0.3 04/16/2021   ALKPHOS 49 04/16/2021   AST 11 04/16/2021   ALT 11 04/16/2021   PROT 6.4 04/16/2021   ALBUMIN 4.1 04/16/2021   CALCIUM 8.7 04/16/2021   ANIONGAP 7 01/20/2015   GFR 76.71 04/16/2021   Lab Results  Component Value Date   CHOL 109 04/16/2021   Lab Results  Component Value Date   HDL 44.00 04/16/2021   Lab Results  Component Value Date   LDLCALC 54 04/16/2021   Lab Results  Component Value Date   TRIG 58.0 04/16/2021   Lab Results  Component Value Date   CHOLHDL 2 04/16/2021   Lab Results  Component Value Date  HGBA1C 5.4 04/16/2021       Assessment & Plan:   Problem List Items Addressed This Visit       Unprioritized   Morbid obesity ([place]) - Primary (Chronic)    Inc wegovy to 2.4 mg weekly con't diet and exericse rto 3 months       Relevant Medications   amphetamine-dextroamphetamine (ADDERALL XR) 30 MG 24 hr capsule   Semaglutide-Weight Management (WEGOVY) 2.4 MG/0.75ML SOAJ   Other Relevant Orders   CBC with Differential/Platelet   Comprehensive metabolic panel   Hemoglobin A1c   Lipid panel   TSH   VITAMIN D 25 Hydroxy (Vit-D Deficiency, Fractures)   Vitamin B12   Vitamin D deficiency   Relevant Medications   Vitamin D, Ergocalciferol, (DRISDOL) 1.25 MG (50000 UNIT) CAPS capsule   Other Relevant  Orders   CBC with Differential/Platelet   Comprehensive metabolic panel   Hemoglobin A1c   Lipid panel   TSH   VITAMIN D 25 Hydroxy (Vit-D Deficiency, Fractures)   Vitamin B12   Attention deficit   Relevant Medications   amphetamine-dextroamphetamine (ADDERALL XR) 30 MG 24 hr capsule   Other Relevant Orders   CBC with Differential/Platelet   Comprehensive metabolic panel   Hemoglobin A1c   Lipid panel   TSH   VITAMIN D 25 Hydroxy (Vit-D Deficiency, Fractures)   Vitamin B12   Depression with anxiety   Relevant Medications   sertraline (ZOLOFT) 100 MG tablet   buPROPion (WELLBUTRIN XL) 150 MG 24 hr tablet   ALPRAZolam (XANAX) 0.25 MG tablet   Other Relevant Orders   CBC with Differential/Platelet   Comprehensive metabolic panel   Hemoglobin A1c   Lipid panel   TSH   VITAMIN D 25 Hydroxy (Vit-D Deficiency, Fractures)   Vitamin B12   Depression, major, single episode, complete remission (HCC)    Stable on meds       Relevant Medications   sertraline (ZOLOFT) 100 MG tablet   buPROPion (WELLBUTRIN XL) 150 MG 24 hr tablet   ALPRAZolam (XANAX) 0.25 MG tablet   Attention deficit disorder    Stable  con't meds       Other Visit Diagnoses     Anxiety       Relevant Medications   sertraline (ZOLOFT) 100 MG tablet   buPROPion (WELLBUTRIN XL) 150 MG 24 hr tablet   ALPRAZolam (XANAX) 0.25 MG tablet   B12 deficiency       Relevant Medications   cyanocobalamin (VITAMIN B12) 1000 MCG/ML injection   Syringe/Needle, Disp, 27G X 5/8" 1 ML MISC   Other Relevant Orders   CBC with Differential/Platelet   Comprehensive metabolic panel   Hemoglobin A1c   Lipid panel   TSH   VITAMIN D 25 Hydroxy (Vit-D Deficiency, Fractures)   Vitamin B12      Meds ordered this encounter  Medications   sertraline (ZOLOFT) 100 MG tablet    Sig: Take 1 tablet (100 mg total) by mouth daily.    Dispense:  90 tablet    Refill:  3   buPROPion (WELLBUTRIN XL) 150 MG 24 hr tablet    Sig: Take  3 tablets (450 mg total) by mouth daily.    Dispense:  270 tablet    Refill:  3   amphetamine-dextroamphetamine (ADDERALL XR) 30 MG 24 hr capsule    Sig: Take 1 capsule (30 mg total) by mouth every morning.    Dispense:  30 capsule    Refill:  0  ALPRAZolam (XANAX) 0.25 MG tablet    Sig: Take 1 tablet (0.25 mg total) by mouth 2 (two) times daily as needed for anxiety.    Dispense:  20 tablet    Refill:  0   Vitamin D, Ergocalciferol, (DRISDOL) 1.25 MG (50000 UNIT) CAPS capsule    Sig: Take 1 capsule (50,000 Units total) by mouth every 7 (seven) days.    Dispense:  4 capsule    Refill:  0   cyanocobalamin (VITAMIN B12) 1000 MCG/ML injection    Sig: Inject 1 mL (1,000 mcg total) into the muscle every 14 (fourteen) days.    Dispense:  10 mL    Refill:  0   Syringe/Needle, Disp, 27G X 5/8" 1 ML MISC    Sig: Use to give B12 injections    Dispense:  50 each    Refill:  0   Semaglutide-Weight Management (WEGOVY) 2.4 MG/0.75ML SOAJ    Sig: Inject 2.4 mg into the skin once a week.    Dispense:  3 mL    Refill:  3    I, Ann Held, DO, personally preformed the services described in this documentation.  All medical record entries made by the scribe were at my direction and in my presence.  I have reviewed the chart and discharge instructions (if applicable) and agree that the record reflects my personal performance and is accurate and complete. 12/29/2021   I,Amber Collins,acting as a scribe for Ann Held, DO.,have documented all relevant documentation on the behalf of Ann Held, DO,as directed by  Ann Held, DO while in the presence of Ann Held, DO.    Ann Held, DO

## 2021-12-30 ENCOUNTER — Other Ambulatory Visit (HOSPITAL_COMMUNITY)
Admission: RE | Admit: 2021-12-30 | Discharge: 2021-12-30 | Disposition: A | Discharge status: Home / Self Care | Payer: No Typology Code available for payment source | Source: Ambulatory Visit | Attending: Family Medicine | Admitting: Family Medicine

## 2021-12-30 ENCOUNTER — Other Ambulatory Visit (HOSPITAL_COMMUNITY): Payer: Self-pay

## 2021-12-30 DIAGNOSIS — Z7251 High risk heterosexual behavior: Secondary | ICD-10-CM | POA: Insufficient documentation

## 2021-12-30 LAB — CBC WITH DIFFERENTIAL/PLATELET
Basophils Absolute: 0.1 10*3/uL (ref 0.0–0.1)
Basophils Relative: 0.9 % (ref 0.0–3.0)
Eosinophils Absolute: 0.2 10*3/uL (ref 0.0–0.7)
Eosinophils Relative: 2.1 % (ref 0.0–5.0)
HCT: 41.7 % (ref 36.0–46.0)
Hemoglobin: 13.8 g/dL (ref 12.0–15.0)
Lymphocytes Relative: 20 % (ref 12.0–46.0)
Lymphs Abs: 2.1 10*3/uL (ref 0.7–4.0)
MCHC: 33 g/dL (ref 30.0–36.0)
MCV: 95.1 fl (ref 78.0–100.0)
Monocytes Absolute: 0.6 10*3/uL (ref 0.1–1.0)
Monocytes Relative: 5.7 % (ref 3.0–12.0)
Neutro Abs: 7.5 10*3/uL (ref 1.4–7.7)
Neutrophils Relative %: 71.3 % (ref 43.0–77.0)
Platelets: 390 10*3/uL (ref 150.0–400.0)
RBC: 4.39 Mil/uL (ref 3.87–5.11)
RDW: 14.1 % (ref 11.5–15.5)
WBC: 10.5 10*3/uL (ref 4.0–10.5)

## 2021-12-30 LAB — COMPREHENSIVE METABOLIC PANEL
ALT: 11 U/L (ref 0–35)
AST: 9 U/L (ref 0–37)
Albumin: 3.9 g/dL (ref 3.5–5.2)
Alkaline Phosphatase: 61 U/L (ref 39–117)
BUN: 12 mg/dL (ref 6–23)
CO2: 28 mEq/L (ref 19–32)
Calcium: 9 mg/dL (ref 8.4–10.5)
Chloride: 103 mEq/L (ref 96–112)
Creatinine, Ser: 1.17 mg/dL (ref 0.40–1.20)
GFR: 58.7 mL/min — ABNORMAL LOW (ref >60.00)
Glucose, Bld: 70 mg/dL (ref 70–99)
Potassium: 3.7 mEq/L (ref 3.5–5.1)
Sodium: 138 mEq/L (ref 135–145)
Total Bilirubin: 0.3 mg/dL (ref 0.2–1.2)
Total Protein: 6.3 g/dL (ref 6.0–8.3)

## 2021-12-30 LAB — LIPID PANEL
Cholesterol: 102 mg/dL (ref 0–200)
HDL: 38 mg/dL — ABNORMAL LOW (ref >39.00)
LDL Cholesterol: 37 mg/dL (ref 0–99)
NonHDL: 64.09
Total CHOL/HDL Ratio: 3
Triglycerides: 136 mg/dL (ref 0.0–149.0)
VLDL: 27.2 mg/dL (ref 0.0–40.0)

## 2021-12-30 LAB — TSH: TSH: 1.45 u[IU]/mL (ref 0.35–5.50)

## 2021-12-30 LAB — VITAMIN D 25 HYDROXY (VIT D DEFICIENCY, FRACTURES): VITD: 28.63 ng/mL — ABNORMAL LOW (ref 30.00–100.00)

## 2021-12-30 LAB — VITAMIN B12: Vitamin B-12: 359 pg/mL (ref 211–911)

## 2021-12-30 LAB — HEMOGLOBIN A1C: Hgb A1c MFr Bld: 5.1 % (ref 4.6–6.5)

## 2021-12-30 NOTE — Addendum Note (Signed)
Addended by: Sanda Linger on: 12/30/2021 09:33 AM   Modules accepted: Orders

## 2021-12-31 ENCOUNTER — Other Ambulatory Visit (HOSPITAL_COMMUNITY): Payer: Self-pay

## 2021-12-31 LAB — CERVICOVAGINAL ANCILLARY ONLY
Bacterial Vaginitis (gardnerella): NEGATIVE
Candida Glabrata: NEGATIVE
Candida Vaginitis: NEGATIVE
Chlamydia: NEGATIVE
Comment: NEGATIVE
Comment: NEGATIVE
Comment: NEGATIVE
Comment: NEGATIVE
Comment: NEGATIVE
Comment: NORMAL
Neisseria Gonorrhea: NEGATIVE
Trichomonas: NEGATIVE

## 2022-01-01 ENCOUNTER — Other Ambulatory Visit (HOSPITAL_COMMUNITY): Payer: Self-pay

## 2022-01-01 ENCOUNTER — Other Ambulatory Visit: Payer: Self-pay

## 2022-01-01 DIAGNOSIS — E559 Vitamin D deficiency, unspecified: Secondary | ICD-10-CM

## 2022-01-01 MED ORDER — VITAMIN D (ERGOCALCIFEROL) 1.25 MG (50000 UNIT) PO CAPS
50000.0000 [IU] | ORAL_CAPSULE | ORAL | 1 refills | Status: DC
  Filled 2022-01-01 – 2022-03-22 (×2): qty 12, 84d supply, fill #0
  Filled 2022-05-07 – 2022-10-07 (×2): qty 12, 84d supply, fill #1

## 2022-01-08 ENCOUNTER — Other Ambulatory Visit (HOSPITAL_COMMUNITY): Payer: Self-pay

## 2022-01-11 ENCOUNTER — Other Ambulatory Visit (HOSPITAL_COMMUNITY): Payer: Self-pay

## 2022-01-13 ENCOUNTER — Ambulatory Visit
Admission: EM | Admit: 2022-01-13 | Discharge: 2022-01-13 | Disposition: A | Discharge status: Home / Self Care | Payer: No Typology Code available for payment source | Attending: Urgent Care | Admitting: Urgent Care

## 2022-01-13 DIAGNOSIS — Z202 Contact with and (suspected) exposure to infections with a predominantly sexual mode of transmission: Secondary | ICD-10-CM

## 2022-01-13 NOTE — ED Triage Notes (Signed)
Pt requesting STD screening-denies sx and known exposure-NAD-steady gait

## 2022-01-13 NOTE — Discharge Instructions (Addendum)
I am sorry for the circumstances of your visit today.  We will let you know as soon as possible what your test results are and if you need any particular treatment.  Generally we call for positive results and for negative results we will notify you through [facility].  Take care and come back whenever you need [place].

## 2022-01-13 NOTE — ED Provider Notes (Signed)
[facility]  Note:  This document was prepared using Systems analyst and may include unintentional dictation errors.  MRN: [id number] DOB: 09-18-1980  Subjective:   Marisa Humphrey is a 41 y.o. female presenting for STD screen. Patient suspects her husband is being unfaithful in his travels.  She just got checked for HIV and defers this test. Denies fever, n/v, abdominal pain, pelvic pain, rashes, dysuria, urinary frequency, hematuria, vaginal discharge.    No current facility-administered medications for this encounter.  Current Outpatient Medications:    acetaminophen (TYLENOL) 500 MG tablet, Take 1,000 mg by mouth every 8 (eight) hours as needed., Disp: , Rfl:    ALPRAZolam (XANAX) 0.25 MG tablet, Take 1 tablet (0.25 mg total) by mouth 2 (two) times daily as needed for anxiety., Disp: 20 tablet, Rfl: 0   amphetamine-dextroamphetamine (ADDERALL XR) 30 MG 24 hr capsule, Take 1 capsule (30 mg total) by mouth every morning., Disp: 30 capsule, Rfl: 0   buPROPion (WELLBUTRIN XL) 150 MG 24 hr tablet, Take 3 tablets (450 mg total) by mouth daily., Disp: 270 tablet, Rfl: 3   buPROPion (WELLBUTRIN XL) 300 MG 24 hr tablet, Take 1 tablet (300 mg total) by mouth daily., Disp: 90 tablet, Rfl: 0   cyanocobalamin (VITAMIN B12) 1000 MCG/ML injection, Inject 1 mL (1,000 mcg total) into the muscle every 14 (fourteen) days., Disp: 10 mL, Rfl: 0   folic acid (FOLVITE) 1 MG tablet, Take 1 tablet (1 mg total) by mouth daily., Disp: 30 tablet, Rfl: 11   ibuprofen (ADVIL) 800 MG tablet, Take 800 mg by mouth every 8 (eight) hours as needed., Disp: , Rfl:    levonorgestrel (MIRENA) 20 MCG/24HR IUD, 1 each by Intrauterine route once., Disp: , Rfl:    Semaglutide-Weight Management (WEGOVY) 2.4 MG/0.75ML SOAJ, Inject 2.4 mg into the skin once a week., Disp: 3 mL, Rfl: 3   sertraline (ZOLOFT) 100 MG tablet, Take 1 tablet (100 mg total) by mouth daily., Disp: 90 tablet,  Rfl: 3   Syringe/Needle, Disp, 27G X 5/8" 1 ML MISC, Use to give B12 injections, Disp: 50 each, Rfl: 0   Vitamin D, Ergocalciferol, (DRISDOL) 1.25 MG (50000 UNIT) CAPS capsule, Take 1 capsule (50,000 Units total) by mouth every 7 (seven) days., Disp: 12 capsule, Rfl: 1   Allergies  Allergen Reactions   Penicillins Rash    Amoxil has been taken w/o reaction    Past Medical History:  Diagnosis Date   ADD (attention deficit disorder)    Anxiety    Depressed    Depression    Edema of both lower legs    Foot pain    GERD (gastroesophageal reflux disease)    Headache    History of condyloma acuminatum    Joint pain    Knee pain    Sleep apnea      Past Surgical History:  Procedure Laterality Date   EXCISION BONE CYST Left 06/05/2019   Procedure: EXCISION BONE CYST;  Surgeon: Leanora Cover, MD;  Location: [place];  Service: Orthopedics;  Laterality: Left;   LAPAROSCOPIC GASTRIC BANDING  11/09/2005   Dr.Martin   MOLE REMOVAL     OPEN REDUCTION INTERNAL FIXATION (ORIF) FINGER WITH RADIAL BONE GRAFT Left 06/05/2019   Procedure: OPEN REDUCTION PIN FIXATION, CURRETAGE BONE LESION WITH CANCELLOUS CHIP BONE GRAFTING;  Surgeon: Leanora Cover, MD;  Location: [place];  Service: Orthopedics;  Laterality: Left;    Family History  Problem Relation Age of Onset   Heart attack Father 50   Heart disease Father 48       MI   High Cholesterol Father    Sudden death Father    Alcoholism Father    Diabetes Mother    Hypertension Mother    Hyperlipidemia Mother    Depression Mother    Anxiety disorder Mother    Bipolar disorder Mother    Alcoholism Mother    Cancer Other        breast   Diabetes Maternal Grandmother    Multiple sclerosis Brother     Social History   Tobacco Use   Smoking status: Some Days    Types: Cigarettes   Smokeless tobacco: Never  Vaping Use   Vaping Use: Never used  Substance Use Topics   Alcohol use: Yes    Comment: occ    Drug use: No    ROS   Objective:   Vitals: BP 131/85 (BP Location: Left Arm)   Pulse 80   Temp 98.8 F (37.1 C) (Oral)   Resp 16   SpO2 98%   Physical Exam Constitutional:      General: She is not in acute distress.    Appearance: Normal appearance. She is well-developed. She is not ill-appearing, toxic-appearing or diaphoretic.  HENT:     Head: Normocephalic and atraumatic.     Nose: Nose normal.     Mouth/Throat:     Mouth: Mucous membranes are moist.  Eyes:     General: No scleral icterus.       Right eye: No discharge.        Left eye: No discharge.     Extraocular Movements: Extraocular movements intact.  Cardiovascular:     Rate and Rhythm: Normal rate.  Pulmonary:     Effort: Pulmonary effort is normal.  Skin:    General: Skin is warm and dry.  Neurological:     General: No focal deficit present.     Mental Status: She is alert and oriented to person, place, and time.  Psychiatric:        Mood and Affect: Mood normal.        Behavior: Behavior normal.     Assessment and Plan :   PDMP not reviewed this encounter.  1. Possible exposure to STD     We will treat as appropriate based off of lab results.   Jaynee Eagles, PA-C 01/13/22 1034

## 2022-01-14 LAB — CERVICOVAGINAL ANCILLARY ONLY
Chlamydia: NEGATIVE
Comment: NEGATIVE
Comment: NEGATIVE
Comment: NORMAL
Neisseria Gonorrhea: NEGATIVE
Trichomonas: NEGATIVE

## 2022-01-14 LAB — RPR: RPR Ser Ql: NONREACTIVE

## 2022-01-28 ENCOUNTER — Encounter: Payer: Self-pay | Admitting: Family Medicine

## 2022-01-28 ENCOUNTER — Other Ambulatory Visit: Payer: Self-pay | Admitting: Family Medicine

## 2022-01-28 ENCOUNTER — Other Ambulatory Visit (HOSPITAL_COMMUNITY): Payer: Self-pay

## 2022-01-28 DIAGNOSIS — F419 Anxiety disorder, unspecified: Secondary | ICD-10-CM

## 2022-01-28 DIAGNOSIS — L819 Disorder of pigmentation, unspecified: Secondary | ICD-10-CM

## 2022-01-28 MED ORDER — ALPRAZOLAM 0.25 MG PO TABS
0.2500 mg | ORAL_TABLET | Freq: Two times a day (BID) | ORAL | 0 refills | Status: DC | PRN
  Filled 2022-01-28: qty 20, 10d supply, fill #0

## 2022-01-28 MED ORDER — TRETINOIN 0.025 % EX CREA
TOPICAL_CREAM | Freq: Every day | CUTANEOUS | 0 refills | Status: DC
  Filled 2022-01-28: qty 45, 30d supply, fill #0

## 2022-01-28 NOTE — Telephone Encounter (Signed)
Requesting: alprazolam 0.'25mg'$   Contract: 09/16/20 UDS: 04/16/21 Last Visit: 12/29/21 Next Visit: None Last Refill: 12/29/21 #20 and 0RF   Please Advise

## 2022-01-29 ENCOUNTER — Other Ambulatory Visit (HOSPITAL_COMMUNITY): Payer: Self-pay

## 2022-02-10 ENCOUNTER — Other Ambulatory Visit: Payer: Self-pay | Admitting: Family Medicine

## 2022-02-10 ENCOUNTER — Other Ambulatory Visit (HOSPITAL_COMMUNITY): Payer: Self-pay

## 2022-02-10 DIAGNOSIS — R41840 Attention and concentration deficit: Secondary | ICD-10-CM

## 2022-02-10 MED ORDER — AMPHETAMINE-DEXTROAMPHET ER 30 MG PO CP24
30.0000 mg | ORAL_CAPSULE | ORAL | 0 refills | Status: DC
  Filled 2022-02-10: qty 30, 30d supply, fill #0

## 2022-02-10 NOTE — Telephone Encounter (Signed)
Requesting: Adderall XR '30mg'$   Contract:09/16/20 UDS:04/16/21 Last Visit:12/29/21 Next Visit: None Last Refill: 12/29/21 #30 and 0RF   Please Advise

## 2022-02-25 ENCOUNTER — Other Ambulatory Visit: Payer: Self-pay | Admitting: Family Medicine

## 2022-02-25 ENCOUNTER — Other Ambulatory Visit: Payer: Self-pay

## 2022-02-25 ENCOUNTER — Other Ambulatory Visit (HOSPITAL_COMMUNITY): Payer: Self-pay

## 2022-02-25 DIAGNOSIS — F419 Anxiety disorder, unspecified: Secondary | ICD-10-CM

## 2022-02-25 MED ORDER — ALPRAZOLAM 0.25 MG PO TABS
0.2500 mg | ORAL_TABLET | Freq: Two times a day (BID) | ORAL | 0 refills | Status: DC | PRN
  Filled 2022-02-25: qty 20, 10d supply, fill #0

## 2022-02-25 NOTE — Telephone Encounter (Signed)
[facility] Pt  Requesting: alprazolam 0.'25mg'$   Contract: 09/16/20 UDS: 04/16/21 Last Visit:  12/29/21 Next Visit: None Last Refill: 01/28/22 #20 and 0RF  Please Advise

## 2022-03-10 ENCOUNTER — Other Ambulatory Visit: Payer: Self-pay | Admitting: Family Medicine

## 2022-03-10 DIAGNOSIS — R41840 Attention and concentration deficit: Secondary | ICD-10-CM

## 2022-03-10 NOTE — Telephone Encounter (Signed)
Requesting: Adderall XR Contract: 04/16/2021 UDS: 04/16/2021 Last OV: 12/29/2021 Next OV: N/A Last Refill: 02/10/2022, #30--0 RF Database:   Please advise

## 2022-03-12 ENCOUNTER — Other Ambulatory Visit (HOSPITAL_COMMUNITY): Payer: Self-pay

## 2022-03-12 MED ORDER — AMPHETAMINE-DEXTROAMPHET ER 30 MG PO CP24
30.0000 mg | ORAL_CAPSULE | ORAL | 0 refills | Status: DC
  Filled 2022-03-12: qty 30, 30d supply, fill #0

## 2022-03-15 ENCOUNTER — Other Ambulatory Visit (HOSPITAL_COMMUNITY): Payer: Self-pay

## 2022-03-15 ENCOUNTER — Other Ambulatory Visit: Payer: Self-pay

## 2022-03-22 ENCOUNTER — Other Ambulatory Visit (HOSPITAL_COMMUNITY): Payer: Self-pay

## 2022-03-22 ENCOUNTER — Other Ambulatory Visit: Payer: Self-pay | Admitting: Family Medicine

## 2022-03-22 ENCOUNTER — Other Ambulatory Visit: Payer: Self-pay

## 2022-03-22 DIAGNOSIS — F419 Anxiety disorder, unspecified: Secondary | ICD-10-CM

## 2022-03-22 MED ORDER — ALPRAZOLAM 0.25 MG PO TABS
0.2500 mg | ORAL_TABLET | Freq: Two times a day (BID) | ORAL | 0 refills | Status: DC | PRN
  Filled 2022-03-22: qty 20, 10d supply, fill #0

## 2022-03-22 NOTE — Telephone Encounter (Signed)
Requesting: alprazolam 0.'25mg'$   Contract: 09/16/20 UDS: 04/16/21 Last Visit: 12/29/21 Next Visit: None Last Refill: 02/25/22 #20 and 0RF   Please Advise

## 2022-03-23 ENCOUNTER — Other Ambulatory Visit (HOSPITAL_COMMUNITY): Payer: Self-pay

## 2022-03-23 ENCOUNTER — Other Ambulatory Visit: Payer: Self-pay

## 2022-03-26 ENCOUNTER — Telehealth: Payer: 59 | Admitting: Physician Assistant

## 2022-03-26 DIAGNOSIS — A084 Viral intestinal infection, unspecified: Secondary | ICD-10-CM

## 2022-03-26 MED ORDER — ONDANSETRON 4 MG PO TBDP: 4.0000 mg | ORAL_TABLET | Freq: Three times a day (TID) | ORAL | 0 refills | Status: DC | PRN

## 2022-03-26 NOTE — Progress Notes (Signed)
E-Visit for Nausea and Vomiting   We are sorry that you are not feeling well. Here is how we plan to help!  Based on what you have shared with me it looks like you have a Virus that is irritating your GI tract.  Vomiting is the forceful emptying of a portion of the stomach's content through the mouth.  Although nausea and vomiting can make you feel miserable, it's important to remember that these are not diseases, but rather symptoms of an underlying illness.  When we treat short term symptoms, we always caution that any symptoms that persist should be fully evaluated in a medical office.  I have prescribed a medication that will help alleviate your symptoms and allow you to stay hydrated:  Zofran 4 mg 1 tablet every 8 hours as needed for nausea and vomiting  HOME CARE: Drink clear liquids.  This is very important! Dehydration (the lack of fluid) can lead to a serious complication.  Start off with 1 tablespoon every 5 minutes for 8 hours. You may begin eating bland foods after 8 hours without vomiting.  Start with saltine crackers, white bread, rice, mashed potatoes, applesauce. After 48 hours on a bland diet, you may resume a normal diet. Try to go to sleep.  Sleep often empties the stomach and relieves the need to vomit.  GET HELP RIGHT AWAY IF:  Your symptoms do not improve or worsen within 2 days after treatment. You have a fever for over 3 days. You cannot keep down fluids after trying the medication.  MAKE SURE YOU:  Understand these instructions. Will watch your condition. Will get help right away if you are not doing well or get worse.    Thank you for choosing an e-visit.  Your e-visit answers were reviewed by a board certified advanced clinical practitioner to complete your personal care plan. Depending upon the condition, your plan could have included both over the counter or prescription medications.  Please review your pharmacy choice. Make sure the pharmacy is open so  you can pick up prescription now. If there is a problem, you may contact your provider through MyChart messaging and have the prescription routed to another pharmacy.  Your safety is important to us. If you have drug allergies check your prescription carefully.   For the next 24 hours you can use MyChart to ask questions about today's visit, request a non-urgent call back, or ask for a work or school excuse. You will get an email in the next two days asking about your experience. I hope that your e-visit has been valuable and will speed your recovery.  I have spent 5 minutes in review of e-visit questionnaire, review and updating patient chart, medical decision making and response to patient.   Eshika Reckart M Dayja Loveridge, PA-C  

## 2022-04-08 ENCOUNTER — Other Ambulatory Visit: Payer: Self-pay | Admitting: Family Medicine

## 2022-04-08 DIAGNOSIS — R41840 Attention and concentration deficit: Secondary | ICD-10-CM

## 2022-04-09 ENCOUNTER — Other Ambulatory Visit (HOSPITAL_COMMUNITY): Payer: Self-pay

## 2022-04-09 MED ORDER — AMPHETAMINE-DEXTROAMPHET ER 30 MG PO CP24
30.0000 mg | ORAL_CAPSULE | ORAL | 0 refills | Status: DC
  Filled 2022-04-09: qty 30, 30d supply, fill #0

## 2022-04-09 NOTE — Telephone Encounter (Signed)
Requesting: Adderall XR Contract: 04/16/2021 UDS: 04/16/2021 Last OV: 12/29/2021 Next OV: N/A Last Refill: 03/12/2022, #30--0 RF Database:   Please advise

## 2022-04-22 ENCOUNTER — Other Ambulatory Visit: Payer: Self-pay | Admitting: Family Medicine

## 2022-04-22 DIAGNOSIS — F419 Anxiety disorder, unspecified: Secondary | ICD-10-CM

## 2022-04-23 ENCOUNTER — Encounter: Payer: Self-pay | Admitting: Family Medicine

## 2022-04-23 NOTE — Telephone Encounter (Signed)
Requesting: alprazolam 0.'25mg'$   Contract: 09/16/20 UDS: 04/16/21 Last Visit: 12/29/21 Next Visit:  None Last Refill: 03/22/22 #20 and 0RF   Please Advise

## 2022-04-24 MED ORDER — ALPRAZOLAM 0.25 MG PO TABS
0.2500 mg | ORAL_TABLET | Freq: Two times a day (BID) | ORAL | 0 refills | Status: DC | PRN
  Filled 2022-04-24: qty 20, 10d supply, fill #0

## 2022-04-26 ENCOUNTER — Other Ambulatory Visit: Payer: Self-pay

## 2022-05-07 ENCOUNTER — Other Ambulatory Visit (HOSPITAL_COMMUNITY): Payer: Self-pay

## 2022-05-07 ENCOUNTER — Other Ambulatory Visit: Payer: Self-pay | Admitting: Family

## 2022-05-07 DIAGNOSIS — R41840 Attention and concentration deficit: Secondary | ICD-10-CM

## 2022-05-08 ENCOUNTER — Other Ambulatory Visit (HOSPITAL_COMMUNITY): Payer: Self-pay

## 2022-05-10 ENCOUNTER — Other Ambulatory Visit: Payer: Self-pay

## 2022-05-10 ENCOUNTER — Other Ambulatory Visit (HOSPITAL_COMMUNITY): Payer: Self-pay

## 2022-05-11 ENCOUNTER — Encounter: Payer: Self-pay | Admitting: Family Medicine

## 2022-05-11 ENCOUNTER — Other Ambulatory Visit (HOSPITAL_COMMUNITY): Payer: Self-pay

## 2022-05-11 DIAGNOSIS — R41840 Attention and concentration deficit: Secondary | ICD-10-CM

## 2022-05-11 MED ORDER — AMPHETAMINE-DEXTROAMPHET ER 30 MG PO CP24
30.0000 mg | ORAL_CAPSULE | ORAL | 0 refills | Status: DC
  Filled 2022-05-11: qty 30, 30d supply, fill #0

## 2022-05-11 NOTE — Telephone Encounter (Signed)
Requesting: Adderall XR Contract: 04/16/2021 UDS: 04/16/2021 Last OV: 12/29/2021 Next OV: N/A Last Refill: 04/09/2022, #30--0 RF Database:   Please advise

## 2022-05-21 ENCOUNTER — Other Ambulatory Visit (HOSPITAL_COMMUNITY): Payer: Self-pay

## 2022-05-21 ENCOUNTER — Other Ambulatory Visit: Payer: Self-pay | Admitting: Family Medicine

## 2022-05-21 ENCOUNTER — Other Ambulatory Visit: Payer: Self-pay

## 2022-05-21 DIAGNOSIS — F419 Anxiety disorder, unspecified: Secondary | ICD-10-CM

## 2022-05-21 MED ORDER — WEGOVY 2.4 MG/0.75ML ~~LOC~~ SOAJ
2.4000 mg | SUBCUTANEOUS | 0 refills | Status: DC
  Filled 2022-05-21: qty 3, 28d supply, fill #0

## 2022-05-21 MED ORDER — ALPRAZOLAM 0.25 MG PO TABS
0.2500 mg | ORAL_TABLET | Freq: Two times a day (BID) | ORAL | 0 refills | Status: DC | PRN
  Filled 2022-05-21: qty 20, 10d supply, fill #0

## 2022-05-21 NOTE — Telephone Encounter (Signed)
Requesting: alprazolam 0.25mg   Contract:09/16/20 UDS: 04/16/21 Last Visit: 12/29/21 Next Visit: None Last Refill: 04/24/22 #20 and 0RF   Please Advise

## 2022-05-24 ENCOUNTER — Other Ambulatory Visit: Payer: Self-pay | Admitting: Family Medicine

## 2022-05-24 ENCOUNTER — Other Ambulatory Visit (HOSPITAL_COMMUNITY): Payer: Self-pay

## 2022-05-24 DIAGNOSIS — L819 Disorder of pigmentation, unspecified: Secondary | ICD-10-CM

## 2022-05-25 ENCOUNTER — Other Ambulatory Visit: Payer: Self-pay

## 2022-05-25 ENCOUNTER — Other Ambulatory Visit (HOSPITAL_COMMUNITY): Payer: Self-pay

## 2022-05-25 MED ORDER — TRETINOIN 0.025 % EX CREA
TOPICAL_CREAM | Freq: Every day | CUTANEOUS | 0 refills | Status: DC
  Filled 2022-05-25: qty 45, 20d supply, fill #0

## 2022-06-07 ENCOUNTER — Other Ambulatory Visit: Payer: Self-pay | Admitting: Family Medicine

## 2022-06-07 ENCOUNTER — Other Ambulatory Visit (HOSPITAL_COMMUNITY): Payer: Self-pay

## 2022-06-07 DIAGNOSIS — R41840 Attention and concentration deficit: Secondary | ICD-10-CM

## 2022-06-07 MED ORDER — AMPHETAMINE-DEXTROAMPHET ER 30 MG PO CP24
30.0000 mg | ORAL_CAPSULE | ORAL | 0 refills | Status: DC
  Filled 2022-06-07 – 2022-06-10 (×2): qty 30, 30d supply, fill #0

## 2022-06-07 NOTE — Telephone Encounter (Signed)
Requesting: Adderall XR 30mg   Contract: 09/22/20 UDS: 04/16/21 Last Visit: 12/29/21 Next Visit: 06/11/22 Last Refill: 05/11/22 #30 and 0RF (several days early)  Please Advise

## 2022-06-10 ENCOUNTER — Other Ambulatory Visit (HOSPITAL_COMMUNITY): Payer: Self-pay

## 2022-06-11 ENCOUNTER — Encounter: Payer: 59 | Admitting: Family Medicine

## 2022-06-11 ENCOUNTER — Other Ambulatory Visit (HOSPITAL_COMMUNITY): Payer: Self-pay

## 2022-06-15 ENCOUNTER — Other Ambulatory Visit (HOSPITAL_COMMUNITY): Payer: Self-pay

## 2022-06-17 ENCOUNTER — Other Ambulatory Visit: Payer: Self-pay | Admitting: Family Medicine

## 2022-06-17 ENCOUNTER — Other Ambulatory Visit (HOSPITAL_COMMUNITY): Payer: Self-pay

## 2022-06-17 DIAGNOSIS — R87810 Cervical high risk human papillomavirus (HPV) DNA test positive: Secondary | ICD-10-CM | POA: Diagnosis not present

## 2022-06-17 DIAGNOSIS — Z114 Encounter for screening for human immunodeficiency virus [HIV]: Secondary | ICD-10-CM | POA: Diagnosis not present

## 2022-06-17 DIAGNOSIS — Z1329 Encounter for screening for other suspected endocrine disorder: Secondary | ICD-10-CM | POA: Diagnosis not present

## 2022-06-17 DIAGNOSIS — Z113 Encounter for screening for infections with a predominantly sexual mode of transmission: Secondary | ICD-10-CM | POA: Diagnosis not present

## 2022-06-17 DIAGNOSIS — Z118 Encounter for screening for other infectious and parasitic diseases: Secondary | ICD-10-CM | POA: Diagnosis not present

## 2022-06-17 DIAGNOSIS — Z13220 Encounter for screening for lipoid disorders: Secondary | ICD-10-CM | POA: Diagnosis not present

## 2022-06-17 DIAGNOSIS — Z131 Encounter for screening for diabetes mellitus: Secondary | ICD-10-CM | POA: Diagnosis not present

## 2022-06-17 DIAGNOSIS — Z01419 Encounter for gynecological examination (general) (routine) without abnormal findings: Secondary | ICD-10-CM | POA: Diagnosis not present

## 2022-06-17 DIAGNOSIS — Z1159 Encounter for screening for other viral diseases: Secondary | ICD-10-CM | POA: Diagnosis not present

## 2022-06-17 DIAGNOSIS — Z1231 Encounter for screening mammogram for malignant neoplasm of breast: Secondary | ICD-10-CM | POA: Diagnosis not present

## 2022-06-17 DIAGNOSIS — Z0000 Encounter for general adult medical examination without abnormal findings: Secondary | ICD-10-CM | POA: Diagnosis not present

## 2022-06-17 LAB — COMPREHENSIVE METABOLIC PANEL: EGFR: 72

## 2022-06-17 LAB — HEMOGLOBIN A1C: A1c: 5.2

## 2022-06-17 LAB — HM MAMMOGRAPHY

## 2022-06-17 MED ORDER — WEGOVY 2.4 MG/0.75ML ~~LOC~~ SOAJ
2.4000 mg | SUBCUTANEOUS | 0 refills | Status: DC
  Filled 2022-06-17: qty 3, 28d supply, fill #0

## 2022-06-17 MED ORDER — FOLIC ACID 1 MG PO TABS
1.0000 mg | ORAL_TABLET | Freq: Every day | ORAL | 3 refills | Status: DC
  Filled 2022-06-17: qty 90, 90d supply, fill #0
  Filled 2023-01-10: qty 90, 90d supply, fill #1

## 2022-06-28 ENCOUNTER — Other Ambulatory Visit (HOSPITAL_COMMUNITY): Payer: Self-pay

## 2022-06-28 ENCOUNTER — Ambulatory Visit (INDEPENDENT_AMBULATORY_CARE_PROVIDER_SITE_OTHER): Payer: 59 | Admitting: Family Medicine

## 2022-06-28 ENCOUNTER — Encounter: Payer: Self-pay | Admitting: Family Medicine

## 2022-06-28 ENCOUNTER — Ambulatory Visit (HOSPITAL_BASED_OUTPATIENT_CLINIC_OR_DEPARTMENT_OTHER)
Admission: RE | Admit: 2022-06-28 | Discharge: 2022-06-28 | Disposition: A | Discharge status: Home / Self Care | Payer: 59 | Source: Ambulatory Visit | Attending: Family Medicine | Admitting: Family Medicine

## 2022-06-28 VITALS — BP 118/80 | HR 83 | Temp 98.9°F | Resp 18 | Ht 67.0 in | Wt 235.8 lb

## 2022-06-28 DIAGNOSIS — R590 Localized enlarged lymph nodes: Secondary | ICD-10-CM

## 2022-06-28 DIAGNOSIS — Z0000 Encounter for general adult medical examination without abnormal findings: Secondary | ICD-10-CM

## 2022-06-28 DIAGNOSIS — L719 Rosacea, unspecified: Secondary | ICD-10-CM | POA: Diagnosis not present

## 2022-06-28 DIAGNOSIS — R41840 Attention and concentration deficit: Secondary | ICD-10-CM | POA: Diagnosis not present

## 2022-06-28 DIAGNOSIS — Z8249 Family history of ischemic heart disease and other diseases of the circulatory system: Secondary | ICD-10-CM

## 2022-06-28 MED ORDER — AMPHETAMINE-DEXTROAMPHET ER 20 MG PO CP24
40.0000 mg | ORAL_CAPSULE | Freq: Every day | ORAL | 0 refills | Status: DC
  Filled 2022-06-28: qty 60, 30d supply, fill #0

## 2022-06-28 MED ORDER — METRONIDAZOLE 0.75 % EX LOTN
TOPICAL_LOTION | CUTANEOUS | 5 refills | Status: DC
  Filled 2022-06-28: qty 59, 20d supply, fill #0

## 2022-06-28 MED ORDER — WEGOVY 2.4 MG/0.75ML ~~LOC~~ SOAJ
2.4000 mg | SUBCUTANEOUS | 0 refills | Status: DC
  Filled 2022-06-28 – 2022-11-23 (×4): qty 3, 28d supply, fill #0

## 2022-06-28 NOTE — Progress Notes (Signed)
Subjective:   By signing my name below, I, Marisa Humphrey, attest that this documentation has been prepared under the direction and in the presence of Marisa Schultz, DO 06/28/22   Patient ID: Marisa Humphrey, female    DOB: [birth year], 42 y.o.   MRN: [id number]  Chief Complaint  Patient presents with   Annual Exam    Pt states not fasting. Pt brings in labs from GYN     HPI Patient is in today for a comprehensive physical exam. She is overall well.   She has been taking Wegovy and reports she has lost weight while on it. She states she has started stretching it out due to insurance no longer covering the cost soon. Since stretching it out her appetite has increased significantly and she is often hungry. She has not been exercising regularly.  She is compliant with 30 mg Adderall XR every day. She states she has been struggling to focus at work and is interested in increasing her dosage.   She brought in her lab work from 4/18 at [facility]. Her creatinine was elevated at 1.01. Labs were otherwise within normal limits.   She endorses rosacea on her cheeks. She initially believed it was due to her facial products.   She has an inflamed right lymph node. She notes that it has been inflamed for years and has never gone away.   She also reports occasional right knee pain. She is receptive to getting shoes with better support.   Last mammogram: 06/18/2022. Results were normal.   She is not UTD on routine vision care. She is UTD on routine dental care every 6 months.   Past Medical History:  Diagnosis Date   ADD (attention deficit disorder)    Anxiety    Depressed    Depression    Edema of both lower legs    Foot pain    GERD (gastroesophageal reflux disease)    Headache    History of condyloma acuminatum    Joint pain    Knee pain    Sleep apnea     Past Surgical History:  Procedure Laterality Date   EXCISION BONE CYST Left 06/05/2019   Procedure: EXCISION  BONE CYST;  Surgeon: Betha Loa, MD;  Location: [facility];  Service: Orthopedics;  Laterality: Left;   LAPAROSCOPIC GASTRIC BANDING  11/09/2005   Dr.Martin   MOLE REMOVAL     OPEN REDUCTION INTERNAL FIXATION (ORIF) FINGER WITH RADIAL BONE GRAFT Left 06/05/2019   Procedure: OPEN REDUCTION PIN FIXATION, CURRETAGE BONE LESION WITH CANCELLOUS CHIP BONE GRAFTING;  Surgeon: Betha Loa, MD;  Location:  SURGERY CENTER;  Service: Orthopedics;  Laterality: Left;    Family History  Problem Relation Age of Onset   Heart attack Father 30   Heart disease Father 17       MI   High Cholesterol Father    Sudden death Father    Alcoholism Father    Diabetes Mother    Hypertension Mother    Hyperlipidemia Mother    Depression Mother    Anxiety disorder Mother    Bipolar disorder Mother    Alcoholism Mother    Cancer Other        breast   Diabetes Maternal Grandmother    Multiple sclerosis Brother     Social History   Socioeconomic History   Marital status: Legally Separated    Spouse name: Marisa Humphrey   Number of children: 1  Years of education: Not on file   Highest education level: Not on file  Occupational History   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  Other Topics Concern   Not on file  Social History Narrative   Exercising --no   Social Determinants of Health   Financial Resource Strain: Not on file  Food Insecurity: Not on file  Transportation Needs: Not on file  Physical Activity: Not on file  Stress: Not on file  Social Connections: Not on file  Intimate Partner Violence: Not on file    Outpatient  Medications Prior to Visit  Medication Sig Dispense Refill   acetaminophen (TYLENOL) 500 MG tablet Take 1,000 mg by mouth every 8 (eight) hours as needed.     ALPRAZolam (XANAX) 0.25 MG tablet Take 1 tablet (0.25 mg total) by mouth 2 (two) times daily as needed for anxiety. 20 tablet 0   buPROPion (WELLBUTRIN XL) 150 MG 24 hr tablet Take 3 tablets (450 mg total) by mouth daily. 270 tablet 3   buPROPion (WELLBUTRIN XL) 300 MG 24 hr tablet Take 1 tablet (300 mg total) by mouth daily. 90 tablet 0   cyanocobalamin (VITAMIN B12) 1000 MCG/ML injection Inject 1 mL (1,000 mcg total) into the muscle every 14 (fourteen) days. 10 mL 0   folic acid (FOLVITE) 1 MG tablet Take 1 tablet (1 mg total) by mouth daily. 30 tablet 11   folic acid (FOLVITE) 1 MG tablet Take 1 tablet (1 mg total) by mouth daily as directed 90 tablet 3   ibuprofen (ADVIL) 800 MG tablet Take 800 mg by mouth every 8 (eight) hours as needed.     levonorgestrel (MIRENA) 20 MCG/24HR IUD 1 each by Intrauterine route once.     tretinoin (RETIN-A) 0.025 % cream Apply topically at bedtime. 45 g 0   TUBERCULIN SYR 1CC/27GX1/2" (B-D TB SYRINGE 1CC/27GX1/2") 27G X 1/2" 1 ML MISC use as directed to inject B-12 50 each 0   Vitamin D, Ergocalciferol, (DRISDOL) 1.25 MG (50000 UNIT) CAPS capsule Take 1 capsule (50,000 Units total) by mouth every 7 (seven) days. 12 capsule 1   amphetamine-dextroamphetamine (ADDERALL XR) 30 MG 24 hr capsule Take 1 capsule (30 mg total) by mouth every morning. 30 capsule 0   Semaglutide-Weight Management (WEGOVY) 2.4 MG/0.75ML SOAJ Inject 2.4 mg into the skin once a week. 3 mL 0   ondansetron (ZOFRAN-ODT) 4 MG disintegrating tablet Take 1 tablet (4 mg total) by mouth every 8 (eight) hours as needed. 20 tablet 0   sertraline (ZOLOFT) 100 MG tablet Take 1 tablet (100 mg total) by mouth daily. 90 tablet 3   No facility-administered medications prior to visit.    Allergies  Allergen Reactions   Penicillins Rash    Amoxil  has been taken w/o reaction    Review of Systems  Constitutional:  Negative for fever and malaise/fatigue.  HENT:  Negative for congestion.   Eyes:  Negative for blurred vision.  Respiratory:  Negative for shortness of breath.   Cardiovascular:  Negative for chest pain, palpitations and leg  swelling.  Gastrointestinal:  Negative for abdominal pain, blood in stool and nausea.  Genitourinary:  Negative for dysuria and frequency.  Musculoskeletal:  Positive for joint pain (occasional right knee pain). Negative for falls.  Skin:  Negative for rash.  Neurological:  Negative for dizziness, loss of consciousness and headaches.  Endo/Heme/Allergies:  Negative for environmental allergies.  Psychiatric/Behavioral:  Negative for depression. The patient is not nervous/anxious.        Objective:    Physical Exam Vitals and nursing note reviewed.  Constitutional:      General: She is not in acute distress.    Appearance: She is well-developed. She is not diaphoretic.  HENT:     Head: Normocephalic and atraumatic.     Right Ear: External ear normal.     Left Ear: External ear normal.     Nose: Nose normal.  Eyes:     General:        Right eye: No discharge.        Left eye: No discharge.     Conjunctiva/sclera: Conjunctivae normal.     Pupils: Pupils are equal, round, and reactive to light.  Neck:     Thyroid: No thyromegaly.     Vascular: No carotid bruit or JVD.  Cardiovascular:     Rate and Rhythm: Normal rate and regular rhythm.     Heart sounds: Normal heart sounds. No murmur heard. Pulmonary:     Effort: Pulmonary effort is normal. No respiratory distress.     Breath sounds: Normal breath sounds. No wheezing or rales.  Chest:     Chest wall: No tenderness.  Abdominal:     General: Bowel sounds are normal. There is no distension.     Palpations: Abdomen is soft. There is no mass.     Tenderness: There is no abdominal tenderness. There is no guarding or rebound.   Genitourinary:    Vagina: Normal. No vaginal discharge.     Rectum: Guaiac result negative.  Musculoskeletal:        General: No tenderness. Normal range of motion.     Cervical back: Normal range of motion and neck supple.  Lymphadenopathy:     Cervical: Cervical adenopathy present.     Right cervical: Superficial cervical adenopathy present.  Skin:    General: Skin is warm and dry.     Findings: Acne and erythema present. No rash.     Comments: Papules and redness both cheeksCheck labs.  Adjust meds prn  Neurological:     Mental Status: She is alert and oriented to person, place, and time.     Cranial Nerves: No cranial nerve deficit.     Deep Tendon Reflexes: Reflexes are normal and symmetric.  Psychiatric:        Behavior: Behavior normal.        Thought Content: Thought content normal.        Judgment: Judgment normal.     BP 118/80 (BP Location: Left Arm, Patient Position: Sitting, Cuff Size: Normal)   Pulse 83   Temp 98.9 F (37.2 C) (Oral)   Resp 18   Ht 5\' 7"  (1.702 m)   Wt 235 lb 12.8 oz (107 kg)   SpO2 100%   BMI 36.93 kg/m  Wt Readings from Last 3 Encounters:  06/28/22 235 lb 12.8 oz (107 kg)  12/29/21 260 lb 3.2 oz (118 kg)  04/16/21 280 lb (127 kg)       Assessment & Plan:  Family history of heart  disease -     [id number]; Inject 2.4 mg into the skin once a week.  Dispense: 3 mL; Refill: 0  Attention deficit -     Amphetamine-Dextroamphet ER; Take 2 capsules (40 mg total) by mouth daily.  Dispense: 60 capsule; Refill: 0  Morbid obesity (HCC) -     VHQION; Inject 2.4 mg into the skin once a week.  Dispense: 3 mL; Refill: 0  Preventative health care Assessment & Plan: Ghm utd Health Maintenance  Topic Date Due   Hepatitis C Screening  Never done   COVID-19 Vaccine (3 - Pfizer risk series) 02/01/2020   INFLUENZA VACCINE  09/30/2022   DTaP/Tdap/Td (3 - Td or Tdap) 07/18/2023   PAP SMEAR-Modifier  06/16/2025   HIV Screening  Completed   HPV  VACCINES  Aged Out   See AVS    Rosacea Assessment & Plan: Metronidazole cream bid  Orders: -     metroNIDAZOLE; Apply to affected area twice a day  Dispense: 59 mL; Refill: 5  Lymphadenopathy, anterior cervical Assessment & Plan: It has bothered her for over a year   Orders: -     US SOFT TISSUE HEAD & NECK (NON-THYROID); Future     I,Rachel Rivera,acting as a scribe for Marisa Schultz, DO.,have documented all relevant documentation on the behalf of Marisa Schultz, DO,as directed by  Marisa Schultz, DO while in the presence of Marisa Schultz, DO.   I, Marisa Schultz, DO, personally preformed the services described in this documentation.  All medical record entries made by the scribe were at my direction and in my presence.  I have reviewed the chart and discharge instructions (if applicable) and agree that the record reflects my personal performance and is accurate and complete. 06/28/22   Marisa Schultz, DO

## 2022-06-28 NOTE — Assessment & Plan Note (Signed)
Metronidazole cream bid

## 2022-06-28 NOTE — Assessment & Plan Note (Signed)
It has bothered her for over a year

## 2022-06-28 NOTE — Assessment & Plan Note (Signed)
Ghm utd Health Maintenance  Topic Date Due   Hepatitis C Screening  Never done   COVID-19 Vaccine (3 - Pfizer risk series) 02/01/2020   INFLUENZA VACCINE  09/30/2022   DTaP/Tdap/Td (3 - Td or Tdap) 07/18/2023   PAP SMEAR-Modifier  06/16/2025   HIV Screening  Completed   HPV VACCINES  Aged Out   See AVS

## 2022-06-29 ENCOUNTER — Encounter: Payer: Self-pay | Admitting: Family Medicine

## 2022-06-29 ENCOUNTER — Other Ambulatory Visit (HOSPITAL_COMMUNITY): Payer: Self-pay

## 2022-06-29 ENCOUNTER — Other Ambulatory Visit: Payer: Self-pay | Admitting: Family Medicine

## 2022-06-29 DIAGNOSIS — F419 Anxiety disorder, unspecified: Secondary | ICD-10-CM

## 2022-06-29 MED ORDER — ALPRAZOLAM 0.25 MG PO TABS
0.2500 mg | ORAL_TABLET | Freq: Two times a day (BID) | ORAL | 0 refills | Status: DC | PRN
  Filled 2022-06-29: qty 20, 10d supply, fill #0

## 2022-06-29 NOTE — Telephone Encounter (Signed)
Requesting: alprazolam 0.25mg   Contract: 09/16/20 UDS: 04/16/21 Last Visit: 06/28/22 Next Visit: None Last Refill: 05/21/22 #20 and 0RF  Please Advise

## 2022-07-01 ENCOUNTER — Other Ambulatory Visit: Payer: Self-pay | Admitting: Family Medicine

## 2022-07-01 ENCOUNTER — Other Ambulatory Visit (HOSPITAL_COMMUNITY): Payer: Self-pay

## 2022-07-01 DIAGNOSIS — R591 Generalized enlarged lymph nodes: Secondary | ICD-10-CM

## 2022-07-01 MED ORDER — DOXYCYCLINE HYCLATE 100 MG PO TABS
100.0000 mg | ORAL_TABLET | Freq: Two times a day (BID) | ORAL | 0 refills | Status: DC
  Filled 2022-07-01: qty 20, 10d supply, fill #0

## 2022-07-13 ENCOUNTER — Other Ambulatory Visit (HOSPITAL_COMMUNITY): Payer: Self-pay

## 2022-07-13 DIAGNOSIS — N898 Other specified noninflammatory disorders of vagina: Secondary | ICD-10-CM | POA: Diagnosis not present

## 2022-07-13 DIAGNOSIS — R87810 Cervical high risk human papillomavirus (HPV) DNA test positive: Secondary | ICD-10-CM | POA: Diagnosis not present

## 2022-07-13 DIAGNOSIS — L293 Anogenital pruritus, unspecified: Secondary | ICD-10-CM | POA: Diagnosis not present

## 2022-07-13 DIAGNOSIS — Z3201 Encounter for pregnancy test, result positive: Secondary | ICD-10-CM | POA: Diagnosis not present

## 2022-07-13 MED ORDER — NYSTATIN-TRIAMCINOLONE 100000-0.1 UNIT/GM-% EX OINT
TOPICAL_OINTMENT | Freq: Two times a day (BID) | CUTANEOUS | 0 refills | Status: DC
  Filled 2022-07-13: qty 15, 7d supply, fill #0

## 2022-07-22 ENCOUNTER — Other Ambulatory Visit (HOSPITAL_COMMUNITY): Payer: Self-pay

## 2022-07-22 ENCOUNTER — Other Ambulatory Visit: Payer: Self-pay | Admitting: Family Medicine

## 2022-07-22 ENCOUNTER — Other Ambulatory Visit: Payer: Self-pay

## 2022-07-22 DIAGNOSIS — F419 Anxiety disorder, unspecified: Secondary | ICD-10-CM

## 2022-07-22 MED ORDER — ALPRAZOLAM 0.25 MG PO TABS
0.2500 mg | ORAL_TABLET | Freq: Two times a day (BID) | ORAL | 0 refills | Status: DC | PRN
  Filled 2022-07-22: qty 20, 10d supply, fill #0

## 2022-07-22 NOTE — Telephone Encounter (Signed)
Requesting: Xanax Contract: 04/16/2021 UDS: 04/16/2021 Last OV: 06/28/2022 Next OV: N/A Last Refill: 06/29/22, #20--0 RF Database:   Please advise

## 2022-07-29 ENCOUNTER — Other Ambulatory Visit (HOSPITAL_COMMUNITY): Payer: Self-pay

## 2022-08-04 ENCOUNTER — Other Ambulatory Visit: Payer: Self-pay | Admitting: Family Medicine

## 2022-08-04 DIAGNOSIS — R41840 Attention and concentration deficit: Secondary | ICD-10-CM

## 2022-08-05 ENCOUNTER — Other Ambulatory Visit (HOSPITAL_COMMUNITY): Payer: Self-pay

## 2022-08-05 MED ORDER — AMPHETAMINE-DEXTROAMPHET ER 20 MG PO CP24
40.0000 mg | ORAL_CAPSULE | Freq: Every day | ORAL | 0 refills | Status: DC
  Filled 2022-08-05 – 2022-08-09 (×2): qty 60, 30d supply, fill #0

## 2022-08-05 NOTE — Telephone Encounter (Signed)
Requesting: Adderall XR 20mg   Contract: 09/16/20 UDS: 04/16/21 Last Visit: 06/28/22 Next Visit: None Last Refill: 06/28/22 #60 and 0RF   Please Advise

## 2022-08-06 ENCOUNTER — Other Ambulatory Visit (HOSPITAL_COMMUNITY): Payer: Self-pay

## 2022-08-06 ENCOUNTER — Other Ambulatory Visit: Payer: Self-pay | Admitting: Family Medicine

## 2022-08-06 DIAGNOSIS — F419 Anxiety disorder, unspecified: Secondary | ICD-10-CM

## 2022-08-06 MED ORDER — ALPRAZOLAM 0.25 MG PO TABS
0.2500 mg | ORAL_TABLET | Freq: Two times a day (BID) | ORAL | 0 refills | Status: DC | PRN
  Filled 2022-08-06: qty 20, 10d supply, fill #0

## 2022-08-06 NOTE — Telephone Encounter (Signed)
Requesting: Xanax Contract: 04/16/2021 UDS: 04/16/2021 Last OV: 06/28/2022 Next OV: N/A Last Refill: 07/22/22 #20 and 0RF Database:   Do we need to move the quantity to a full 30 day supply since she requests refills q2 weeks?    Please advise

## 2022-08-09 ENCOUNTER — Other Ambulatory Visit (HOSPITAL_COMMUNITY): Payer: Self-pay

## 2022-08-09 ENCOUNTER — Other Ambulatory Visit: Payer: Self-pay

## 2022-09-06 ENCOUNTER — Other Ambulatory Visit: Payer: Self-pay | Admitting: Family Medicine

## 2022-09-06 DIAGNOSIS — R41840 Attention and concentration deficit: Secondary | ICD-10-CM

## 2022-09-06 DIAGNOSIS — F419 Anxiety disorder, unspecified: Secondary | ICD-10-CM

## 2022-09-07 ENCOUNTER — Other Ambulatory Visit (HOSPITAL_COMMUNITY): Payer: Self-pay

## 2022-09-07 MED ORDER — ALPRAZOLAM 0.25 MG PO TABS
0.2500 mg | ORAL_TABLET | Freq: Two times a day (BID) | ORAL | 0 refills | Status: DC | PRN
  Filled 2022-09-07: qty 20, 10d supply, fill #0

## 2022-09-07 MED ORDER — AMPHETAMINE-DEXTROAMPHET ER 20 MG PO CP24
40.0000 mg | ORAL_CAPSULE | Freq: Every day | ORAL | 0 refills | Status: DC
  Filled 2022-09-07 – 2022-09-08 (×2): qty 60, 30d supply, fill #0

## 2022-09-07 NOTE — Telephone Encounter (Signed)
Requesting: alprazolam 0.25mg  and adderall xr 20mg   Contract:09/16/20 UDS: 04/16/21 Last Visit: 06/28/22 Next Visit: None Last Refill on alprazolam: 08/06/22 #20 and 0RF Last Refill on adderall 08/05/22 #60 and 0RF  Please Advise

## 2022-09-08 ENCOUNTER — Other Ambulatory Visit: Payer: Self-pay

## 2022-09-08 ENCOUNTER — Other Ambulatory Visit (HOSPITAL_COMMUNITY): Payer: Self-pay

## 2022-09-29 ENCOUNTER — Other Ambulatory Visit: Payer: Self-pay | Admitting: Family Medicine

## 2022-09-29 DIAGNOSIS — F419 Anxiety disorder, unspecified: Secondary | ICD-10-CM

## 2022-09-29 NOTE — Telephone Encounter (Signed)
Requesting: Xanax Contract: 04/16/21 UDS: 04/16/21 Last OV: 06/28/22 Next OV: N/A Last Refill: 09/07/22, #20--0 RF Database:   Please advise

## 2022-09-30 ENCOUNTER — Other Ambulatory Visit (HOSPITAL_COMMUNITY): Payer: Self-pay

## 2022-09-30 MED ORDER — ALPRAZOLAM 0.25 MG PO TABS
0.2500 mg | ORAL_TABLET | Freq: Two times a day (BID) | ORAL | 0 refills | Status: DC | PRN
  Filled 2022-09-30: qty 20, 10d supply, fill #0

## 2022-10-07 ENCOUNTER — Encounter (HOSPITAL_COMMUNITY): Payer: Self-pay

## 2022-10-07 ENCOUNTER — Other Ambulatory Visit: Payer: Self-pay

## 2022-10-07 ENCOUNTER — Other Ambulatory Visit (HOSPITAL_COMMUNITY): Payer: Self-pay

## 2022-10-07 ENCOUNTER — Other Ambulatory Visit: Payer: Self-pay | Admitting: Family Medicine

## 2022-10-07 DIAGNOSIS — E538 Deficiency of other specified B group vitamins: Secondary | ICD-10-CM

## 2022-10-07 DIAGNOSIS — R41840 Attention and concentration deficit: Secondary | ICD-10-CM

## 2022-10-07 MED ORDER — CYANOCOBALAMIN 1000 MCG/ML IJ SOLN
1000.0000 ug | INTRAMUSCULAR | 0 refills | Status: DC
  Filled 2022-10-07 – 2022-10-08 (×2): qty 6, 84d supply, fill #0

## 2022-10-07 MED ORDER — AMPHETAMINE-DEXTROAMPHET ER 20 MG PO CP24
40.0000 mg | ORAL_CAPSULE | Freq: Every day | ORAL | 0 refills | Status: DC
  Filled 2022-10-07: qty 60, 30d supply, fill #0

## 2022-10-07 NOTE — Telephone Encounter (Signed)
Requesting: adderall Contract:09/06/20 UDS:04/16/21 Last Visit:06/28/22 Next Visit:n/a Last Refill:09/07/22  Please Advise

## 2022-10-08 ENCOUNTER — Other Ambulatory Visit (HOSPITAL_COMMUNITY): Payer: Self-pay

## 2022-10-08 ENCOUNTER — Encounter: Payer: Self-pay | Admitting: Family Medicine

## 2022-10-08 ENCOUNTER — Other Ambulatory Visit: Payer: Self-pay | Admitting: Family Medicine

## 2022-10-08 DIAGNOSIS — R41840 Attention and concentration deficit: Secondary | ICD-10-CM

## 2022-10-08 MED ORDER — AMPHETAMINE-DEXTROAMPHETAMINE 30 MG PO TABS
30.0000 mg | ORAL_TABLET | Freq: Two times a day (BID) | ORAL | 0 refills | Status: DC
  Filled 2022-10-08: qty 60, 30d supply, fill #0

## 2022-10-11 ENCOUNTER — Other Ambulatory Visit (HOSPITAL_COMMUNITY): Payer: Self-pay

## 2022-10-26 ENCOUNTER — Other Ambulatory Visit: Payer: Self-pay | Admitting: Family Medicine

## 2022-10-26 DIAGNOSIS — F419 Anxiety disorder, unspecified: Secondary | ICD-10-CM

## 2022-10-26 NOTE — Telephone Encounter (Signed)
Requesting: alprazolam 0.25mg   Contract: 09/22/20 UDS: 04/16/21 Last Visit: 06/28/22 Next Visit: None Last Refill: 09/30/22 #20 and 0RF    Please Advise

## 2022-10-27 ENCOUNTER — Other Ambulatory Visit (HOSPITAL_COMMUNITY): Payer: Self-pay

## 2022-10-27 MED ORDER — ALPRAZOLAM 0.25 MG PO TABS
0.2500 mg | ORAL_TABLET | Freq: Two times a day (BID) | ORAL | 0 refills | Status: DC | PRN
  Filled 2022-10-27: qty 20, 10d supply, fill #0

## 2022-10-29 ENCOUNTER — Other Ambulatory Visit (HOSPITAL_COMMUNITY): Payer: Self-pay

## 2022-11-05 ENCOUNTER — Other Ambulatory Visit (HOSPITAL_COMMUNITY): Payer: Self-pay

## 2022-11-05 ENCOUNTER — Other Ambulatory Visit: Payer: Self-pay | Admitting: Family Medicine

## 2022-11-05 DIAGNOSIS — R41840 Attention and concentration deficit: Secondary | ICD-10-CM

## 2022-11-05 MED ORDER — AMPHETAMINE-DEXTROAMPHETAMINE 30 MG PO TABS
30.0000 mg | ORAL_TABLET | Freq: Two times a day (BID) | ORAL | 0 refills | Status: DC
  Filled 2022-11-05: qty 60, 30d supply, fill #0

## 2022-11-05 NOTE — Telephone Encounter (Signed)
Requesting: adderall Contract:09/06/20 UDS:04/16/21 Last Visit:06/28/22 Next Visit:n/a Last Refill: 10/08/2022 #60 with no refills   Please Advise

## 2022-11-16 ENCOUNTER — Other Ambulatory Visit (HOSPITAL_COMMUNITY): Payer: Self-pay

## 2022-11-18 ENCOUNTER — Other Ambulatory Visit (HOSPITAL_COMMUNITY): Payer: Self-pay

## 2022-11-22 ENCOUNTER — Encounter (HOSPITAL_COMMUNITY): Payer: Self-pay

## 2022-11-23 ENCOUNTER — Other Ambulatory Visit (HOSPITAL_COMMUNITY): Payer: Self-pay

## 2022-11-25 ENCOUNTER — Other Ambulatory Visit: Payer: Self-pay | Admitting: Family Medicine

## 2022-11-25 ENCOUNTER — Other Ambulatory Visit (HOSPITAL_COMMUNITY): Payer: Self-pay

## 2022-11-25 DIAGNOSIS — F419 Anxiety disorder, unspecified: Secondary | ICD-10-CM

## 2022-11-25 MED ORDER — ALPRAZOLAM 0.25 MG PO TABS
0.2500 mg | ORAL_TABLET | Freq: Two times a day (BID) | ORAL | 0 refills | Status: DC | PRN
  Filled 2022-11-25: qty 20, 10d supply, fill #0

## 2022-11-25 NOTE — Telephone Encounter (Signed)
Requesting: Xanax Contract: 04/16/2021 UDS: 04/16/21 Last OV: 06/28/22 Next OV: n/a Last Refill: 10/27/22, #20--0 RF Database:   Please advise

## 2022-11-26 ENCOUNTER — Other Ambulatory Visit (HOSPITAL_COMMUNITY): Payer: Self-pay

## 2022-11-30 ENCOUNTER — Other Ambulatory Visit (HOSPITAL_COMMUNITY): Payer: Self-pay

## 2022-12-02 ENCOUNTER — Other Ambulatory Visit (HOSPITAL_COMMUNITY): Payer: Self-pay

## 2022-12-02 ENCOUNTER — Telehealth: Payer: Self-pay

## 2022-12-02 NOTE — Telephone Encounter (Signed)
*  Primary  Pharmacy Patient Advocate Encounter   Received notification from CoverMyMeds that prior authorization for [facility] 2.4MG /0.75ML auto-injectors  is required/requested.   Insurance verification completed.   The patient is insured through [facility] .   Per test claim:  Medication is excluded from coverage-no longer covered

## 2022-12-06 ENCOUNTER — Other Ambulatory Visit: Payer: Self-pay | Admitting: Family Medicine

## 2022-12-06 DIAGNOSIS — R41840 Attention and concentration deficit: Secondary | ICD-10-CM

## 2022-12-07 ENCOUNTER — Other Ambulatory Visit (HOSPITAL_COMMUNITY): Payer: Self-pay

## 2022-12-07 MED ORDER — AMPHETAMINE-DEXTROAMPHETAMINE 30 MG PO TABS
30.0000 mg | ORAL_TABLET | Freq: Two times a day (BID) | ORAL | 0 refills | Status: DC
  Filled 2022-12-07: qty 60, 30d supply, fill #0

## 2022-12-07 NOTE — Telephone Encounter (Signed)
Requesting: Adderall 30mg   Contract: 09/22/20 UDS: 04/16/21 Last Visit: 06/28/22 Next Visit: None Last Refill: 11/05/22 #60 and 0RF   Please Advise

## 2022-12-08 ENCOUNTER — Other Ambulatory Visit (HOSPITAL_COMMUNITY): Payer: Self-pay

## 2022-12-08 ENCOUNTER — Ambulatory Visit
Admission: EM | Admit: 2022-12-08 | Discharge: 2022-12-08 | Disposition: A | Discharge status: Home / Self Care | Payer: 59 | Attending: Internal Medicine | Admitting: Internal Medicine

## 2022-12-08 DIAGNOSIS — N898 Other specified noninflammatory disorders of vagina: Secondary | ICD-10-CM | POA: Insufficient documentation

## 2022-12-08 DIAGNOSIS — Z113 Encounter for screening for infections with a predominantly sexual mode of transmission: Secondary | ICD-10-CM | POA: Diagnosis not present

## 2022-12-08 MED ORDER — FLUCONAZOLE 150 MG PO TABS
150.0000 mg | ORAL_TABLET | Freq: Every day | ORAL | 0 refills | Status: AC
Start: 2022-12-08 — End: 2022-12-13
  Filled 2022-12-08: qty 2, 3d supply, fill #0

## 2022-12-08 NOTE — ED Triage Notes (Signed)
Pt presents for STD testing, denies sxs and exposure.

## 2022-12-08 NOTE — ED Provider Notes (Signed)
[facility]    CSN: [id number] Arrival date & time: 12/08/22  1256      History   Chief Complaint Chief Complaint  Patient presents with   SEXUALLY TRANSMITTED DISEASE    HPI Marisa Humphrey is a 42 y.o. female for vaginal itching and STD screening.  Patient reports a couple days of vaginal itching without discharge, dysuria, fevers, nausea/vomiting, flank pain.  No STD exposure but would like routine screening.  Reports her symptoms feel similar to previous yeast infection symptoms.  No other concerns at this time. HPI  Past Medical History:  Diagnosis Date   ADD (attention deficit disorder)    Anxiety    Depressed    Depression    Edema of both lower legs    Foot pain    GERD (gastroesophageal reflux disease)    Headache    History of condyloma acuminatum    Joint pain    Knee pain    Sleep apnea     Patient Active Problem List   Diagnosis Date Noted   Preventative health care 06/28/2022   Rosacea 06/28/2022   Lymphadenopathy, anterior cervical 06/28/2022   Attention deficit 09/16/2020   Vitamin D deficiency 09/16/2020   Depression with anxiety 04/19/2019   Attention deficit disorder 04/01/2018   Right knee pain 08/22/2017   Depression, major, single episode, moderate (HCC) 04/25/2017   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   Viral gastroenteritis 11/19/2014   Severe obesity (BMI >= 40) (HCC) 01/15/2014   Indication for care or intervention related to labor and delivery 09/23/2013   Postpartum care following vaginal delivery (7/26) 09/23/2013   SVD (spontaneous vaginal delivery) 09/23/2013   Morbid obesity (HCC) 11/30/2012   BACK PAIN 10/31/2009   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   BACK STRAIN 12/27/2007   BENIGN NEOPLASM OF EAR&EXTERNAL AUDITORY CANAL 06/06/2007   GERD 06/06/2007   Depression, major, single episode, complete remission (HCC) 11/14/2006    Past Surgical History:  Procedure Laterality Date    EXCISION BONE CYST Left 06/05/2019   Procedure: EXCISION BONE CYST;  Surgeon: Betha Loa, MD;  Location: [facility];  Service: Orthopedics;  Laterality: Left;   LAPAROSCOPIC GASTRIC BANDING  11/09/2005   Dr.Martin   MOLE REMOVAL     OPEN REDUCTION INTERNAL FIXATION (ORIF) FINGER WITH RADIAL BONE GRAFT Left 06/05/2019   Procedure: OPEN REDUCTION PIN FIXATION, CURRETAGE BONE LESION WITH CANCELLOUS CHIP BONE GRAFTING;  Surgeon: Betha Loa, MD;  Location: [facility];  Service: Orthopedics;  Laterality: Left;    OB History     Gravida  3   Para  2   Term  2   Preterm      AB  1   Living  2      SAB  1   IAB      Ectopic      Multiple      Live Births  2            Home Medications    Prior to Admission medications   Medication Sig Start Date End Date Taking? Authorizing Provider  fluconazole (DIFLUCAN) 150 MG tablet Take 1 tablet (150 mg total) by mouth daily for 1 day. Take 1 dose today and may repeat in 3 days if symptoms persist 12/08/22 12/09/22 Yes Radford Pax, NP  acetaminophen (TYLENOL) 500 MG tablet Take 1,000 mg by mouth every 8 (eight) hours as needed.    [provider]  ALPRAZolam (  XANAX) 0.25 MG tablet Take 1 tablet (0.25 mg total) by mouth 2 (two) times daily as needed for anxiety. 11/25/22   Eulis Foster, FNP  amphetamine-dextroamphetamine (ADDERALL XR) 20 MG 24 hr capsule Take 2 capsules (40 mg total) by mouth daily. 10/07/22   Seabron Spates R, DO  amphetamine-dextroamphetamine (ADDERALL) 30 MG tablet Take 1 tablet by mouth 2 (two) times daily. 12/07/22   [name], DO  buPROPion (WELLBUTRIN XL) 150 MG 24 hr tablet Take 3 tablets (450 mg total) by mouth daily. 12/29/21   [name], DO  buPROPion (WELLBUTRIN XL) 300 MG 24 hr tablet Take 1 tablet (300 mg total) by mouth daily. 07/03/20   Helane Rima, DO  cyanocobalamin (VITAMIN B12) 1000 MCG/ML injection Inject 1 mL (1,000 mcg total) into  the muscle every 14 (fourteen) days. 10/07/22   [name], DO  doxycycline (VIBRA-TABS) 100 MG tablet Take 1 tablet (100 mg total) by mouth 2 (two) times daily. 07/01/22   [name], DO  folic acid (FOLVITE) 1 MG tablet Take 1 tablet (1 mg total) by mouth daily. 04/29/21     folic acid (FOLVITE) 1 MG tablet Take 1 tablet (1 mg total) by mouth daily as directed 06/17/22     ibuprofen (ADVIL) 800 MG tablet Take 800 mg by mouth every 8 (eight) hours as needed.    [provider]  levonorgestrel (MIRENA) 20 MCG/24HR IUD 1 each by Intrauterine route once.    [provider]  METRONIDAZOLE, TOPICAL, 0.75 % LOTN Apply to affected area twice a day 06/28/22   Zola Button, Grayling Congress, DO  nystatin-triamcinolone ointment [facility]) Apply topically to affected area 2 (two) times daily. 07/13/22     Semaglutide-Weight Management (WEGOVY) 2.4 MG/0.75ML SOAJ Inject 2.4 mg into the skin once a week. 06/28/22   [name], DO  tretinoin (RETIN-A) 0.025 % cream Apply topically at bedtime. 05/25/22   [name], DO  TUBERCULIN SYR 1CC/27GX1/2" (B-D TB SYRINGE 1CC/27GX1/2") 27G X 1/2" 1 ML MISC use as directed to inject B-12 12/29/21   Zola Button, Grayling Congress, DO  Vitamin D, Ergocalciferol, (DRISDOL) 1.25 MG (50000 UNIT) CAPS capsule Take 1 capsule (50,000 Units total) by mouth every 7 (seven) days. 01/01/22   [name], DO    Family History Family History  Problem Relation Age of Onset   Heart attack Father 4   Heart disease Father 56       MI   High Cholesterol Father    Sudden death Father    Alcoholism Father    Diabetes Mother    Hypertension Mother    Hyperlipidemia Mother    Depression Mother    Anxiety disorder Mother    Bipolar disorder Mother    Alcoholism Mother    Cancer Other        breast   Diabetes Maternal Grandmother    Multiple sclerosis Brother     Social History Social History   Tobacco Use   Smoking status: Some Days     Types: Cigarettes   Smokeless tobacco: Never  Vaping Use   Vaping status: Never Used  Substance Use Topics   Alcohol use: Yes    Comment: occ   Drug use: No     Allergies   Penicillins   Review of Systems Review of Systems  Genitourinary:        Vaginal itching  Physical Exam Triage Vital Signs ED Triage Vitals [12/08/22 1348]  Encounter Vitals Group     BP 120/86     Systolic BP Percentile      Diastolic BP Percentile      Pulse Rate 87     Resp 17     Temp      Temp src      SpO2 97 %     Weight      Height      Head Circumference      Peak Flow      Pain Score 0     Pain Loc      Pain Education      Exclude from Growth Chart    No data found.  Updated Vital Signs BP 120/86 (BP Location: Right Arm)   Pulse 87   Resp 17   SpO2 97%   Visual Acuity Right Eye Distance:   Left Eye Distance:   Bilateral Distance:    Right Eye Near:   Left Eye Near:    Bilateral Near:     Physical Exam Vitals and nursing note reviewed.  Constitutional:      Appearance: Normal appearance.  HENT:     Head: Normocephalic and atraumatic.  Eyes:     Pupils: Pupils are equal, round, and reactive to light.  Cardiovascular:     Rate and Rhythm: Normal rate.  Pulmonary:     Effort: Pulmonary effort is normal.  Abdominal:     Tenderness: There is no right CVA tenderness or left CVA tenderness.  Skin:    General: Skin is warm and dry.  Neurological:     General: No focal deficit present.     Mental Status: She is alert and oriented to person, place, and time.  Psychiatric:        Mood and Affect: Mood normal.        Behavior: Behavior normal.      UC Treatments / Results  Labs (all labs ordered are listed, but only abnormal results are displayed) Labs Reviewed  RPR  HIV ANTIBODY (ROUTINE TESTING W REFLEX)  CERVICOVAGINAL ANCILLARY ONLY    EKG   Radiology No results found.  Procedures Procedures (including critical care time)  Medications  Ordered in UC Medications - No data to display  Initial Impression / Assessment and Plan / UC Course  I have reviewed the triage vital signs and the nursing notes.  Pertinent labs & imaging results that were available during my care of the patient were reviewed by me and considered in my medical decision making (see chart for details).     STD testing is ordered and will contact for any positive results.  Will start Diflucan for yeast infection symptoms.  Patient to follow-up with PCP as needed. Final Clinical Impressions(s) / UC Diagnoses   Final diagnoses:  Vaginal itching  Screening examination for STD (sexually transmitted disease)     Discharge Instructions      Start Diflucan as prescribed.  The clinic will contact you with results of the testing done today if positive.  Please follow-up with your PCP as needed.    ED Prescriptions     Medication Sig Dispense Auth. Provider   fluconazole (DIFLUCAN) 150 MG tablet Take 1 tablet (150 mg total) by mouth daily for 1 day. Take 1 dose today and may repeat in 3 days if symptoms persist 2 tablet Radford Pax, NP      PDMP not reviewed this  encounter.   Radford Pax, NP 12/08/22 [phone]

## 2022-12-08 NOTE — Discharge Instructions (Addendum)
Start Diflucan as prescribed.  The clinic will contact you with results of the testing done today if positive.  Please follow-up with your PCP as needed.

## 2022-12-09 LAB — CERVICOVAGINAL ANCILLARY ONLY
Bacterial Vaginitis (gardnerella): POSITIVE — AB
Candida Glabrata: NEGATIVE
Candida Vaginitis: NEGATIVE
Chlamydia: NEGATIVE
Comment: NEGATIVE
Comment: NEGATIVE
Comment: NEGATIVE
Comment: NEGATIVE
Comment: NEGATIVE
Comment: NORMAL
Neisseria Gonorrhea: NEGATIVE
Trichomonas: NEGATIVE

## 2022-12-09 LAB — RPR: RPR Ser Ql: NONREACTIVE

## 2022-12-09 LAB — HIV ANTIBODY (ROUTINE TESTING W REFLEX): HIV Screen 4th Generation wRfx: NONREACTIVE

## 2022-12-09 MED ORDER — METRONIDAZOLE 500 MG PO TABS: 500.0000 mg | ORAL_TABLET | Freq: Two times a day (BID) | ORAL | 0 refills | Status: DC

## 2022-12-10 ENCOUNTER — Telehealth: Payer: Self-pay | Admitting: Emergency Medicine

## 2022-12-10 ENCOUNTER — Other Ambulatory Visit (HOSPITAL_COMMUNITY): Payer: Self-pay

## 2022-12-10 NOTE — Telephone Encounter (Signed)
Upon chart review, Metronidazole for positive BV was sent by Langston Masker

## 2022-12-16 ENCOUNTER — Other Ambulatory Visit: Payer: Self-pay | Admitting: Family

## 2022-12-16 DIAGNOSIS — F419 Anxiety disorder, unspecified: Secondary | ICD-10-CM

## 2022-12-22 ENCOUNTER — Encounter (HOSPITAL_COMMUNITY): Payer: Self-pay

## 2022-12-22 ENCOUNTER — Other Ambulatory Visit: Payer: Self-pay | Admitting: Family Medicine

## 2022-12-22 ENCOUNTER — Other Ambulatory Visit (HOSPITAL_COMMUNITY): Payer: Self-pay

## 2022-12-22 DIAGNOSIS — F419 Anxiety disorder, unspecified: Secondary | ICD-10-CM

## 2022-12-22 MED ORDER — ALPRAZOLAM 0.25 MG PO TABS
0.2500 mg | ORAL_TABLET | Freq: Two times a day (BID) | ORAL | 0 refills | Status: DC | PRN
Start: 2022-12-22 — End: 2023-01-10
  Filled 2022-12-22: qty 20, 10d supply, fill #0

## 2022-12-22 NOTE — Telephone Encounter (Signed)
Requesting: alprazolam 0.25mg   Contract: 09/22/20 UDS:04/16/21 Last Visit: 06/28/22 Next Visit: None Last Refill: 11/25/22 #20 and 0RF   Please Advise

## 2023-01-10 ENCOUNTER — Other Ambulatory Visit (HOSPITAL_COMMUNITY): Payer: Self-pay

## 2023-01-10 ENCOUNTER — Other Ambulatory Visit: Payer: Self-pay

## 2023-01-10 ENCOUNTER — Other Ambulatory Visit: Payer: Self-pay | Admitting: Family Medicine

## 2023-01-10 DIAGNOSIS — R41840 Attention and concentration deficit: Secondary | ICD-10-CM

## 2023-01-10 DIAGNOSIS — F419 Anxiety disorder, unspecified: Secondary | ICD-10-CM

## 2023-01-10 DIAGNOSIS — E538 Deficiency of other specified B group vitamins: Secondary | ICD-10-CM

## 2023-01-10 MED ORDER — "BD TB SYRINGE 27G X 1/2"" 1 ML MISC"
0 refills | Status: AC
  Filled 2023-01-10: qty 50, 50d supply, fill #0

## 2023-01-10 MED ORDER — ALPRAZOLAM 0.25 MG PO TABS
0.2500 mg | ORAL_TABLET | Freq: Two times a day (BID) | ORAL | 0 refills | Status: DC | PRN
Start: 2023-01-10 — End: 2023-02-10
  Filled 2023-01-10: qty 20, 10d supply, fill #0

## 2023-01-10 MED ORDER — AMPHETAMINE-DEXTROAMPHETAMINE 30 MG PO TABS
30.0000 mg | ORAL_TABLET | Freq: Two times a day (BID) | ORAL | 0 refills | Status: DC
Start: 2023-01-10 — End: 2023-02-10
  Filled 2023-01-10: qty 60, 30d supply, fill #0

## 2023-01-10 NOTE — Telephone Encounter (Signed)
Requesting: Adderall 30mg  and alprazolam 0.25mg   Contract: 09/22/20 UDS: 04/16/21 Last Visit: 06/28/22 Next Visit: 06/24/23 Last Refill on Adderall: 12/07/22 #60 and 0RF Last Refill on alprazolam: 12/22/22 #20 and 0RF   Please Advise

## 2023-01-11 ENCOUNTER — Other Ambulatory Visit (HOSPITAL_COMMUNITY): Payer: Self-pay

## 2023-01-12 ENCOUNTER — Other Ambulatory Visit (HOSPITAL_COMMUNITY): Payer: Self-pay

## 2023-01-20 ENCOUNTER — Ambulatory Visit
Admission: RE | Admit: 2023-01-20 | Discharge: 2023-01-20 | Disposition: A | Discharge status: Home / Self Care | Payer: 59 | Source: Ambulatory Visit | Attending: Family Medicine | Admitting: Family Medicine

## 2023-01-20 ENCOUNTER — Other Ambulatory Visit (HOSPITAL_COMMUNITY): Payer: Self-pay

## 2023-01-20 VITALS — BP 145/92 | HR 77 | Temp 98.4°F | Resp 18 | Ht 67.0 in | Wt 230.0 lb

## 2023-01-20 DIAGNOSIS — Z202 Contact with and (suspected) exposure to infections with a predominantly sexual mode of transmission: Secondary | ICD-10-CM | POA: Diagnosis not present

## 2023-01-20 DIAGNOSIS — K219 Gastro-esophageal reflux disease without esophagitis: Secondary | ICD-10-CM | POA: Insufficient documentation

## 2023-01-20 DIAGNOSIS — E669 Obesity, unspecified: Secondary | ICD-10-CM | POA: Insufficient documentation

## 2023-01-20 DIAGNOSIS — Z79899 Other long term (current) drug therapy: Secondary | ICD-10-CM | POA: Diagnosis not present

## 2023-01-20 DIAGNOSIS — L292 Pruritus vulvae: Secondary | ICD-10-CM | POA: Insufficient documentation

## 2023-01-20 DIAGNOSIS — Z6836 Body mass index (BMI) 36.0-36.9, adult: Secondary | ICD-10-CM | POA: Insufficient documentation

## 2023-01-20 DIAGNOSIS — N898 Other specified noninflammatory disorders of vagina: Secondary | ICD-10-CM | POA: Diagnosis not present

## 2023-01-20 DIAGNOSIS — F988 Other specified behavioral and emotional disorders with onset usually occurring in childhood and adolescence: Secondary | ICD-10-CM | POA: Diagnosis not present

## 2023-01-20 DIAGNOSIS — Z113 Encounter for screening for infections with a predominantly sexual mode of transmission: Secondary | ICD-10-CM | POA: Insufficient documentation

## 2023-01-20 MED ORDER — FLUCONAZOLE 200 MG PO TABS
200.0000 mg | ORAL_TABLET | ORAL | 0 refills | Status: DC
  Filled 2023-01-20: qty 7, 21d supply, fill #0

## 2023-01-20 NOTE — Discharge Instructions (Addendum)
Advised patient take medication as directed.  Encouraged to increase daily water intake to 64 ounces per day while taking this medication.  Advised we will follow-up with Aptima swab results once received.  Vies if symptoms worsen and/or unresolved please follow-up with PCP, GYN, or here for further evaluation.

## 2023-01-20 NOTE — ED Triage Notes (Signed)
Patient c/o vaginal itching x 1 week.  Denies any vaginal discharge or odor.  Patient was diagnosed with BV x 1 month ago and unsure if it did not completely clear up.  Would like STI testing, no bloodwork.

## 2023-01-20 NOTE — ED Provider Notes (Signed)
[facility]    CSN: [id number] Arrival date & time: 01/20/23  0840      History   Chief Complaint Chief Complaint  Patient presents with   SEXUALLY TRANSMITTED DISEASE    Entered by patient    HPI Marisa Humphrey is a 42 y.o. female.   HPI 42 year old female presents with vaginal itching for 1 week.  Patient is unsure bacterial vaginosis from 1 month ago has cleared and would like to be checked.  Patient also request STD testing without serological tests.  PMH significant for obesity, GERD and ADD.  Past Medical History:  Diagnosis Date   ADD (attention deficit disorder)    Anxiety    Depressed    Depression    Edema of both lower legs    Foot pain    GERD (gastroesophageal reflux disease)    Headache    History of condyloma acuminatum    Joint pain    Knee pain    Sleep apnea     Patient Active Problem List   Diagnosis Date Noted   Preventative health care 06/28/2022   Rosacea 06/28/2022   Lymphadenopathy, anterior cervical 06/28/2022   Attention deficit 09/16/2020   Vitamin D deficiency 09/16/2020   Depression with anxiety 04/19/2019   Attention deficit disorder 04/01/2018   Right knee pain 08/22/2017   Depression, major, single episode, moderate (HCC) 04/25/2017   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   Viral gastroenteritis 11/19/2014   Severe obesity (BMI >= 40) (HCC) 01/15/2014   Indication for care or intervention related to labor and delivery 09/23/2013   Postpartum care following vaginal delivery (7/26) 09/23/2013   SVD (spontaneous vaginal delivery) 09/23/2013   Morbid obesity (HCC) 11/30/2012   BACK PAIN 10/31/2009   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   BACK STRAIN 12/27/2007   BENIGN NEOPLASM OF EAR&EXTERNAL AUDITORY CANAL 06/06/2007   GERD 06/06/2007   Depression, major, single episode, complete remission (HCC) 11/14/2006    Past Surgical History:  Procedure Laterality Date   EXCISION BONE  CYST Left 06/05/2019   Procedure: EXCISION BONE CYST;  Surgeon: Betha Loa, MD;  Location: [facility];  Service: Orthopedics;  Laterality: Left;   LAPAROSCOPIC GASTRIC BANDING  11/09/2005   Dr.Martin   MOLE REMOVAL     OPEN REDUCTION INTERNAL FIXATION (ORIF) FINGER WITH RADIAL BONE GRAFT Left 06/05/2019   Procedure: OPEN REDUCTION PIN FIXATION, CURRETAGE BONE LESION WITH CANCELLOUS CHIP BONE GRAFTING;  Surgeon: Betha Loa, MD;  Location: [facility];  Service: Orthopedics;  Laterality: Left;    OB History     Gravida  3   Para  2   Term  2   Preterm      AB  1   Living  2      SAB  1   IAB      Ectopic      Multiple      Live Births  2            Home Medications    Prior to Admission medications   Medication Sig Start Date End Date Taking? Authorizing Provider  acetaminophen (TYLENOL) 500 MG tablet Take 1,000 mg by mouth every 8 (eight) hours as needed.   Yes [provider]  ALPRAZolam (XANAX) 0.25 MG tablet Take 1 tablet (0.25 mg total) by mouth 2 (two) times daily as needed for anxiety. 01/10/23  Yes Seabron Spates R, DO  amphetamine-dextroamphetamine (ADDERALL XR) 20 MG 24  hr capsule Take 2 capsules (40 mg total) by mouth daily. 10/07/22  Yes Seabron Spates R, DO  amphetamine-dextroamphetamine (ADDERALL) 30 MG tablet Take 1 tablet by mouth 2 (two) times daily. 01/10/23  Yes Seabron Spates R, DO  buPROPion (WELLBUTRIN XL) 150 MG 24 hr tablet Take 3 tablets (450 mg total) by mouth daily. 12/29/21  Yes Seabron Spates R, DO  buPROPion (WELLBUTRIN XL) 300 MG 24 hr tablet Take 1 tablet (300 mg total) by mouth daily. 07/03/20  Yes Helane Rima, DO  cyanocobalamin (VITAMIN B12) 1000 MCG/ML injection Inject 1 mL (1,000 mcg total) into the muscle every 14 (fourteen) days. 10/07/22  Yes [name], DO  doxycycline (VIBRA-TABS) 100 MG tablet Take 1 tablet (100 mg total) by mouth 2 (two) times daily. 07/01/22   Yes Seabron Spates R, DO  fluconazole (DIFLUCAN) 200 MG tablet Take 1 tablet (200 mg total) by mouth now, may repeat in 3 days if needed if symptoms are not resolved. 01/20/23  Yes Trevor Iha, FNP  folic acid (FOLVITE) 1 MG tablet Take 1 tablet (1 mg total) by mouth daily. 04/29/21  Yes   folic acid (FOLVITE) 1 MG tablet Take 1 tablet (1 mg total) by mouth daily as directed 06/17/22  Yes   ibuprofen (ADVIL) 800 MG tablet Take 800 mg by mouth every 8 (eight) hours as needed.   Yes [provider]  levonorgestrel (MIRENA) 20 MCG/24HR IUD 1 each by Intrauterine route once.   Yes [provider]  metroNIDAZOLE (FLAGYL) 500 MG tablet Take 1 tablet (500 mg total) by mouth 2 (two) times daily. 12/09/22  Yes Cheron Schaumann K, PA-C  METRONIDAZOLE, TOPICAL, 0.75 % LOTN Apply to affected area twice a day 06/28/22  Yes [name], DO  nystatin-triamcinolone ointment [facility]) Apply topically to affected area 2 (two) times daily. 07/13/22  Yes   Semaglutide-Weight Management (WEGOVY) 2.4 MG/0.75ML SOAJ Inject 2.4 mg into the skin once a week. 06/28/22  Yes Seabron Spates R, DO  tretinoin (RETIN-A) 0.025 % cream Apply topically at bedtime. 05/25/22  Yes Seabron Spates R, DO  TUBERCULIN SYR 1CC/27GX1/2" (B-D TB SYRINGE 1CC/27GX1/2") 27G X 1/2" 1 ML MISC use as directed to inject B-12 01/10/23  Yes Seabron Spates R, DO  Vitamin D, Ergocalciferol, (DRISDOL) 1.25 MG (50000 UNIT) CAPS capsule Take 1 capsule (50,000 Units total) by mouth every 7 (seven) days. 01/01/22  Yes [name], DO    Family History Family History  Problem Relation Age of Onset   Heart attack Father 53   Heart disease Father 13       MI   High Cholesterol Father    Sudden death Father    Alcoholism Father    Diabetes Mother    Hypertension Mother    Hyperlipidemia Mother    Depression Mother    Anxiety disorder Mother    Bipolar disorder Mother    Alcoholism Mother    Cancer  Other        breast   Diabetes Maternal Grandmother    Multiple sclerosis Brother     Social History Social History   Tobacco Use   Smoking status: Some Days    Types: Cigarettes   Smokeless tobacco: Never  Vaping Use   Vaping status: Never Used  Substance Use Topics   Alcohol use: Yes    Comment: occ   Drug use: No     Allergies  Penicillins   Review of Systems Review of Systems  Genitourinary:        Vaginal itching x 1 week  All other systems reviewed and are negative.    Physical Exam Triage Vital Signs ED Triage Vitals  Encounter Vitals Group     BP 01/20/23 0942 (!) 145/92     Systolic BP Percentile --      Diastolic BP Percentile --      Pulse Rate 01/20/23 0942 77     Resp 01/20/23 0942 18     Temp 01/20/23 0942 98.4 F (36.9 C)     Temp Source 01/20/23 0942 Oral     SpO2 01/20/23 0942 100 %     Weight 01/20/23 0944 230 lb (104.3 kg)     Height 01/20/23 0944 5\' 7"  (1.702 m)     Head Circumference --      Peak Flow --      Pain Score 01/20/23 0943 0     Pain Loc --      Pain Education --      Exclude from Growth Chart --    No data found.  Updated Vital Signs BP (!) 145/92 (BP Location: Left Arm)   Pulse 77   Temp 98.4 F (36.9 C) (Oral)   Resp 18   Ht 5\' 7"  (1.702 m)   Wt 230 lb (104.3 kg)   SpO2 100%   BMI 36.02 kg/m    Physical Exam Vitals and nursing note reviewed.  Constitutional:      Appearance: Normal appearance. She is normal weight.  HENT:     Head: Normocephalic and atraumatic.     Mouth/Throat:     Mouth: Mucous membranes are moist.     Pharynx: Oropharynx is clear.  Eyes:     Extraocular Movements: Extraocular movements intact.     Conjunctiva/sclera: Conjunctivae normal.     Pupils: Pupils are equal, round, and reactive to light.  Cardiovascular:     Rate and Rhythm: Normal rate and regular rhythm.     Pulses: Normal pulses.     Heart sounds: Normal heart sounds.  Pulmonary:     Effort: Pulmonary effort is  normal.     Breath sounds: Normal breath sounds. No wheezing, rhonchi or rales.  Musculoskeletal:        General: Normal range of motion.     Cervical back: Normal range of motion and neck supple.  Skin:    General: Skin is warm and dry.  Neurological:     General: No focal deficit present.     Mental Status: She is alert and oriented to person, place, and time. Mental status is at baseline.  Psychiatric:        Mood and Affect: Mood normal.        Behavior: Behavior normal.      UC Treatments / Results  Labs (all labs ordered are listed, but only abnormal results are displayed) Labs Reviewed  CERVICOVAGINAL ANCILLARY ONLY    EKG   Radiology No results found.  Procedures Procedures (including critical care time)  Medications Ordered in UC Medications - No data to display  Initial Impression / Assessment and Plan / UC Course  I have reviewed the triage vital signs and the nursing notes.  Pertinent labs & imaging results that were available during my care of the patient were reviewed by me and considered in my medical decision making (see chart for details).     MDM: 1.  Vaginal  itching-Rx'd Diflucan 200 mg tablet: Take 1 tablet by mouth now may repeat in 3 days if needed; 2.  Potential exposure to STD-Aptima swab ordered. Advised patient take medication as directed.  Encouraged to increase daily water intake to 64 ounces per day while taking this medication.  Advised we will follow-up with Aptima swab results once received.  Vies if symptoms worsen and/or unresolved please follow-up with PCP, GYN, or here for further evaluation.  Patient discharged home, hemodynamically stable. Final Clinical Impressions(s) / UC Diagnoses   Final diagnoses:  Vaginal itching  Potential exposure to STD     Discharge Instructions      Advised patient take medication as directed.  Encouraged to increase daily water intake to 64 ounces per day while taking this medication.  Advised we  will follow-up with Aptima swab results once received.  Vies if symptoms worsen and/or unresolved please follow-up with PCP, GYN, or here for further evaluation.     ED Prescriptions     Medication Sig Dispense Auth. Provider   fluconazole (DIFLUCAN) 200 MG tablet Take 1 tablet (200 mg total) by mouth now, may repeat in 3 days if needed if symptoms are not resolved. 7 tablet Trevor Iha, FNP      PDMP not reviewed this encounter.   Trevor Iha, FNP 01/20/23 1105

## 2023-01-21 LAB — CERVICOVAGINAL ANCILLARY ONLY
Bacterial Vaginitis (gardnerella): NEGATIVE
Candida Glabrata: NEGATIVE
Candida Vaginitis: POSITIVE — AB
Chlamydia: NEGATIVE
Comment: NEGATIVE
Comment: NEGATIVE
Comment: NEGATIVE
Comment: NEGATIVE
Comment: NEGATIVE
Comment: NORMAL
Neisseria Gonorrhea: NEGATIVE
Trichomonas: NEGATIVE

## 2023-02-10 ENCOUNTER — Other Ambulatory Visit: Payer: Self-pay | Admitting: Family Medicine

## 2023-02-10 DIAGNOSIS — R41840 Attention and concentration deficit: Secondary | ICD-10-CM

## 2023-02-10 DIAGNOSIS — F419 Anxiety disorder, unspecified: Secondary | ICD-10-CM

## 2023-02-11 ENCOUNTER — Other Ambulatory Visit (HOSPITAL_COMMUNITY): Payer: Self-pay

## 2023-02-11 MED ORDER — AMPHETAMINE-DEXTROAMPHETAMINE 30 MG PO TABS
30.0000 mg | ORAL_TABLET | Freq: Two times a day (BID) | ORAL | 0 refills | Status: DC
Start: 2023-02-11 — End: 2023-03-16
  Filled 2023-02-11: qty 20, 10d supply, fill #0
  Filled 2023-02-11: qty 40, 20d supply, fill #0

## 2023-02-11 MED ORDER — ALPRAZOLAM 0.25 MG PO TABS
0.2500 mg | ORAL_TABLET | Freq: Two times a day (BID) | ORAL | 0 refills | Status: DC | PRN
Start: 2023-02-11 — End: 2023-03-10
  Filled 2023-02-11: qty 20, 10d supply, fill #0

## 2023-03-10 ENCOUNTER — Other Ambulatory Visit: Payer: Self-pay | Admitting: Family Medicine

## 2023-03-10 DIAGNOSIS — F419 Anxiety disorder, unspecified: Secondary | ICD-10-CM

## 2023-03-11 ENCOUNTER — Other Ambulatory Visit (HOSPITAL_COMMUNITY): Payer: Self-pay

## 2023-03-11 MED ORDER — ALPRAZOLAM 0.25 MG PO TABS
0.2500 mg | ORAL_TABLET | Freq: Two times a day (BID) | ORAL | 0 refills | Status: DC | PRN
Start: 2023-03-11 — End: 2023-03-31
  Filled 2023-03-11: qty 20, 10d supply, fill #0

## 2023-03-11 NOTE — Telephone Encounter (Signed)
 Requesting: alprazolam 0.25mg   Contract: 09/22/20 UDS: 04/16/21 Last Visit: 06/28/22 Next Visit: 06/24/23 Last Refill: 02/11/23 #20 and 0RF   Please Advise

## 2023-03-16 ENCOUNTER — Other Ambulatory Visit: Payer: Self-pay | Admitting: Family Medicine

## 2023-03-16 ENCOUNTER — Other Ambulatory Visit (HOSPITAL_COMMUNITY): Payer: Self-pay

## 2023-03-16 DIAGNOSIS — R41840 Attention and concentration deficit: Secondary | ICD-10-CM

## 2023-03-16 MED ORDER — AMPHETAMINE-DEXTROAMPHETAMINE 30 MG PO TABS
30.0000 mg | ORAL_TABLET | Freq: Two times a day (BID) | ORAL | 0 refills | Status: DC
Start: 2023-03-16 — End: 2023-04-20
  Filled 2023-03-16: qty 60, 30d supply, fill #0

## 2023-03-16 NOTE — Telephone Encounter (Signed)
 Requesting: Adderall  30mg   Contract: 09/22/20 UDS: 04/16/21 Last Visit: 06/28/22 Next Visit: 06/24/23 Last Refill: 02/11/23 #60 and 0RF   Please Advise

## 2023-03-31 ENCOUNTER — Other Ambulatory Visit: Payer: Self-pay | Admitting: Family Medicine

## 2023-03-31 DIAGNOSIS — F419 Anxiety disorder, unspecified: Secondary | ICD-10-CM

## 2023-03-31 MED ORDER — ALPRAZOLAM 0.25 MG PO TABS
0.2500 mg | ORAL_TABLET | Freq: Two times a day (BID) | ORAL | 0 refills | Status: DC | PRN
Start: 2023-03-31 — End: 2023-04-27
  Filled 2023-03-31: qty 20, 10d supply, fill #0

## 2023-03-31 NOTE — Telephone Encounter (Signed)
Requesting: alprazolam 0.25mg   Contract: 09/22/20 UDS: 04/16/21 Last Visit: 06/28/22 Next Visit: 06/24/23 Last Refill: 03/11/2023 #20 and 0RF    Please Advise

## 2023-04-01 ENCOUNTER — Other Ambulatory Visit (HOSPITAL_COMMUNITY): Payer: Self-pay

## 2023-04-20 ENCOUNTER — Other Ambulatory Visit: Payer: Self-pay | Admitting: Family Medicine

## 2023-04-20 ENCOUNTER — Telehealth: Payer: Commercial Managed Care - PPO | Admitting: Physician Assistant

## 2023-04-20 DIAGNOSIS — B9689 Other specified bacterial agents as the cause of diseases classified elsewhere: Secondary | ICD-10-CM

## 2023-04-20 DIAGNOSIS — N760 Acute vaginitis: Secondary | ICD-10-CM | POA: Diagnosis not present

## 2023-04-20 DIAGNOSIS — R41840 Attention and concentration deficit: Secondary | ICD-10-CM

## 2023-04-20 MED ORDER — METRONIDAZOLE 500 MG PO TABS
500.0000 mg | ORAL_TABLET | Freq: Two times a day (BID) | ORAL | 0 refills | Status: AC
Start: 2023-04-20 — End: 2023-04-27

## 2023-04-20 MED ORDER — AMPHETAMINE-DEXTROAMPHETAMINE 30 MG PO TABS
30.0000 mg | ORAL_TABLET | Freq: Two times a day (BID) | ORAL | 0 refills | Status: DC
Start: 2023-04-20 — End: 2023-05-25
  Filled 2023-04-20: qty 60, 30d supply, fill #0

## 2023-04-20 NOTE — Progress Notes (Signed)

## 2023-04-20 NOTE — Telephone Encounter (Signed)
 Requesting: Adderall 30mg   Contract: 09/22/20 UDS: 04/16/21 Last Visit: 06/28/22 Next Visit: 06/24/23 Last Refill: 03/16/2023 #60 and 0RF    Please Advise

## 2023-04-21 ENCOUNTER — Other Ambulatory Visit (HOSPITAL_COMMUNITY): Payer: Self-pay

## 2023-04-27 ENCOUNTER — Other Ambulatory Visit: Payer: Self-pay | Admitting: Family Medicine

## 2023-04-27 DIAGNOSIS — F419 Anxiety disorder, unspecified: Secondary | ICD-10-CM

## 2023-04-28 ENCOUNTER — Other Ambulatory Visit: Payer: Self-pay

## 2023-04-28 ENCOUNTER — Other Ambulatory Visit (HOSPITAL_COMMUNITY): Payer: Self-pay

## 2023-04-28 MED ORDER — ALPRAZOLAM 0.25 MG PO TABS
0.2500 mg | ORAL_TABLET | Freq: Two times a day (BID) | ORAL | 0 refills | Status: DC | PRN
Start: 2023-04-28 — End: 2023-05-25
  Filled 2023-04-28: qty 20, 10d supply, fill #0

## 2023-04-28 NOTE — Telephone Encounter (Signed)
 Requesting: Xanax Contract: 04/2021 UDS: 04/2021 Last OV: 06/28/22 Next OV: 06/24/23 Last Refill: 03/31/2023, #20--0 RF Database:   Please advise

## 2023-04-30 ENCOUNTER — Telehealth: Admitting: Family Medicine

## 2023-04-30 DIAGNOSIS — B9689 Other specified bacterial agents as the cause of diseases classified elsewhere: Secondary | ICD-10-CM | POA: Diagnosis not present

## 2023-04-30 DIAGNOSIS — J029 Acute pharyngitis, unspecified: Secondary | ICD-10-CM | POA: Diagnosis not present

## 2023-04-30 DIAGNOSIS — R591 Generalized enlarged lymph nodes: Secondary | ICD-10-CM

## 2023-04-30 DIAGNOSIS — J019 Acute sinusitis, unspecified: Secondary | ICD-10-CM | POA: Diagnosis not present

## 2023-04-30 MED ORDER — DOXYCYCLINE HYCLATE 100 MG PO TABS: 100.0000 mg | ORAL_TABLET | Freq: Two times a day (BID) | ORAL | 0 refills | Status: AC

## 2023-04-30 NOTE — Progress Notes (Signed)

## 2023-05-25 ENCOUNTER — Other Ambulatory Visit: Payer: Self-pay | Admitting: Family Medicine

## 2023-05-25 DIAGNOSIS — F419 Anxiety disorder, unspecified: Secondary | ICD-10-CM

## 2023-05-25 DIAGNOSIS — R41840 Attention and concentration deficit: Secondary | ICD-10-CM

## 2023-05-26 ENCOUNTER — Other Ambulatory Visit (HOSPITAL_COMMUNITY): Payer: Self-pay

## 2023-05-26 MED ORDER — AMPHETAMINE-DEXTROAMPHETAMINE 30 MG PO TABS
30.0000 mg | ORAL_TABLET | Freq: Two times a day (BID) | ORAL | 0 refills | Status: DC
  Filled 2023-05-26: qty 60, 30d supply, fill #0

## 2023-05-26 MED ORDER — ALPRAZOLAM 0.25 MG PO TABS
0.2500 mg | ORAL_TABLET | Freq: Two times a day (BID) | ORAL | 0 refills | Status: DC | PRN
  Filled 2023-05-26: qty 20, 10d supply, fill #0

## 2023-06-23 ENCOUNTER — Other Ambulatory Visit: Payer: Self-pay | Admitting: Family

## 2023-06-23 DIAGNOSIS — F419 Anxiety disorder, unspecified: Secondary | ICD-10-CM

## 2023-06-23 DIAGNOSIS — R41840 Attention and concentration deficit: Secondary | ICD-10-CM

## 2023-06-24 ENCOUNTER — Encounter: Payer: Self-pay | Admitting: Family Medicine

## 2023-06-24 ENCOUNTER — Other Ambulatory Visit: Payer: Self-pay

## 2023-06-24 ENCOUNTER — Ambulatory Visit (INDEPENDENT_AMBULATORY_CARE_PROVIDER_SITE_OTHER): Payer: 59 | Admitting: Family Medicine

## 2023-06-24 ENCOUNTER — Other Ambulatory Visit (HOSPITAL_COMMUNITY): Payer: Self-pay

## 2023-06-24 VITALS — BP 134/92 | HR 81 | Temp 98.5°F | Resp 18 | Ht 67.0 in | Wt 248.8 lb

## 2023-06-24 DIAGNOSIS — F419 Anxiety disorder, unspecified: Secondary | ICD-10-CM | POA: Diagnosis not present

## 2023-06-24 DIAGNOSIS — Z79899 Other long term (current) drug therapy: Secondary | ICD-10-CM

## 2023-06-24 DIAGNOSIS — Z720 Tobacco use: Secondary | ICD-10-CM | POA: Diagnosis not present

## 2023-06-24 DIAGNOSIS — L719 Rosacea, unspecified: Secondary | ICD-10-CM | POA: Diagnosis not present

## 2023-06-24 DIAGNOSIS — Z0000 Encounter for general adult medical examination without abnormal findings: Secondary | ICD-10-CM | POA: Diagnosis not present

## 2023-06-24 DIAGNOSIS — R41840 Attention and concentration deficit: Secondary | ICD-10-CM

## 2023-06-24 LAB — LIPID PANEL
Cholesterol: 100 mg/dL (ref 0–200)
HDL: 49.7 mg/dL (ref >39.00)
LDL Cholesterol: 42 mg/dL (ref 0–99)
NonHDL: 50.78
Total CHOL/HDL Ratio: 2
Triglycerides: 42 mg/dL (ref 0.0–149.0)
VLDL: 8.4 mg/dL (ref 0.0–40.0)

## 2023-06-24 LAB — COMPREHENSIVE METABOLIC PANEL WITH GFR
ALT: 11 U/L (ref 0–35)
AST: 10 U/L (ref 0–37)
Albumin: 4 g/dL (ref 3.5–5.2)
Alkaline Phosphatase: 52 U/L (ref 39–117)
BUN: 11 mg/dL (ref 6–23)
CO2: 26 meq/L (ref 19–32)
Calcium: 8.6 mg/dL (ref 8.4–10.5)
Chloride: 104 meq/L (ref 96–112)
Creatinine, Ser: 0.85 mg/dL (ref 0.40–1.20)
GFR: 85.24 mL/min (ref >60.00)
Glucose, Bld: 88 mg/dL (ref 70–99)
Potassium: 4.2 meq/L (ref 3.5–5.1)
Sodium: 138 meq/L (ref 135–145)
Total Bilirubin: 0.4 mg/dL (ref 0.2–1.2)
Total Protein: 6.2 g/dL (ref 6.0–8.3)

## 2023-06-24 LAB — CBC WITH DIFFERENTIAL/PLATELET
Basophils Absolute: 0 10*3/uL (ref 0.0–0.1)
Basophils Relative: 0.4 % (ref 0.0–3.0)
Eosinophils Absolute: 0.1 10*3/uL (ref 0.0–0.7)
Eosinophils Relative: 1.3 % (ref 0.0–5.0)
HCT: 43.6 % (ref 36.0–46.0)
Hemoglobin: 14.5 g/dL (ref 12.0–15.0)
Lymphocytes Relative: 19.9 % (ref 12.0–46.0)
Lymphs Abs: 1.7 10*3/uL (ref 0.7–4.0)
MCHC: 33.2 g/dL (ref 30.0–36.0)
MCV: 96.6 fl (ref 78.0–100.0)
Monocytes Absolute: 0.5 10*3/uL (ref 0.1–1.0)
Monocytes Relative: 6 % (ref 3.0–12.0)
Neutro Abs: 6.4 10*3/uL (ref 1.4–7.7)
Neutrophils Relative %: 72.4 % (ref 43.0–77.0)
Platelets: 349 10*3/uL (ref 150.0–400.0)
RBC: 4.52 Mil/uL (ref 3.87–5.11)
RDW: 13.1 % (ref 11.5–15.5)
WBC: 8.8 10*3/uL (ref 4.0–10.5)

## 2023-06-24 LAB — TSH: TSH: 1.29 u[IU]/mL (ref 0.35–5.50)

## 2023-06-24 MED ORDER — ALPRAZOLAM 0.25 MG PO TABS
0.2500 mg | ORAL_TABLET | Freq: Two times a day (BID) | ORAL | 0 refills | Status: DC | PRN
  Filled 2023-06-24 (×2): qty 20, 10d supply, fill #0

## 2023-06-24 MED ORDER — VARENICLINE TARTRATE (STARTER) 0.5 MG X 11 & 1 MG X 42 PO TBPK
ORAL_TABLET | ORAL | 0 refills | Status: DC
  Filled 2023-06-24: qty 53, 28d supply, fill #0

## 2023-06-24 MED ORDER — METRONIDAZOLE 0.75 % EX LOTN
TOPICAL_LOTION | CUTANEOUS | 5 refills | Status: DC
Start: 2023-06-24 — End: 2023-11-17
  Filled 2023-06-24: qty 59, 20d supply, fill #0

## 2023-06-24 MED ORDER — AMPHETAMINE-DEXTROAMPHETAMINE 30 MG PO TABS
30.0000 mg | ORAL_TABLET | Freq: Two times a day (BID) | ORAL | 0 refills | Status: DC
  Filled 2023-06-24: qty 60, 30d supply, fill #0

## 2023-06-24 MED ORDER — SERTRALINE HCL 50 MG PO TABS
50.0000 mg | ORAL_TABLET | Freq: Every day | ORAL | 3 refills | Status: DC
  Filled 2023-06-24: qty 30, 30d supply, fill #0
  Filled 2023-08-15: qty 30, 30d supply, fill #1

## 2023-06-24 NOTE — Patient Instructions (Signed)
 Living With Attention Deficit Hyperactivity Disorder If you have been diagnosed with attention deficit hyperactivity disorder (ADHD), you may be relieved that you now know why you have felt or behaved a certain way. Still, you may feel overwhelmed about the treatment ahead. You may also wonder how to get the support you need and how to deal with the condition day-to-day. With treatment and support, you can live with ADHD and manage your symptoms. How to manage lifestyle changes Managing lifestyle changes can be challenging. Seeking support from your healthcare provider, therapist, family, and friends can be helpful. How to recognize changes in your condition The following signs may mean that your treatment is working well and your condition is improving: Consistently being on time for appointments. Being more organized at home and work. Other people noticing improvements in your behavior. Achieving goals that you set for yourself. Thinking more clearly. The following signs may mean that your treatment is not working very well: Feeling impatience or more confusion. Missing, forgetting, or being late for appointments. An increasing sense of disorganization and messiness. More difficulty in reaching goals that you set for yourself. Loved ones becoming angry or frustrated with you. Follow these instructions at home: Medicines Take over-the-counter and prescription medicines only as told by your health care provider. Check with your health care provider before taking any new medicines. General instructions Create structure and an organized atmosphere at home. For example: Make a list of tasks, then rank them from most important to least important. Work on one task at a time until your listed tasks are done. Make a daily schedule and follow it consistently every day. Use an appointment calendar, and check it 2-3 times a day to keep on track. Keep it with you when you leave the house. Create  spaces where you keep certain things, and always put things back in their places after you use them. Keep all follow-up visits. Your health care provider will need to monitor your condition and adjust your treatment over time. Where to find support Talking to others  Keep emotion out of important discussions and speak in a calm, logical way. Listen closely and patiently to your loved ones. Try to understand their point of view, and try to avoid getting defensive. Take responsibility for the consequences of your actions. Ask that others do not take your behaviors personally. Aim to solve problems as they come up, and express your feelings instead of bottling them up. Talk openly about what you need from your loved ones and how they can support you. Consider going to family therapy sessions or having your family meet with a specialist who deals with ADHD-related behavior problems. Finances Not all insurance plans cover mental health care, so it is important to check with your insurance carrier. If paying for co-pays or counseling services is a problem, search for a local or county mental health care center. Public mental health care services may be offered there at a low cost or no cost when you are not able to see a private health care provider. If you are taking medicine for ADHD, you may be able to get the generic form, which may be less expensive than brand-name medicine. Some makers of prescription medicines also offer help to patients who cannot afford the medicines that they need. Therapy and support groups Talking with a mental health care provider and participating in support groups can help to improve your quality of life, daily functioning, and overall symptoms. Questions to ask your health  care provider: What are the risks and benefits of taking medicines? Would I benefit from therapy? How often should I follow up with a health care provider? Where to find more information Learn more  about ADHD from: Children and Adults with Attention Deficit Hyperactivity Disorder: [employer] of Mental Health: BloggerCourse.com Centers for Disease Control and Prevention: TonerPromos.no Contact a health care provider if: You have side effects from your medicines, such as: Repeated muscle twitches, coughing, or speech outbursts. Sleep problems. Loss of appetite. Dizziness. Unusually fast heartbeat. Stomach pains. Headaches. You have new or worsening behavior problems. You are struggling with anxiety, depression, or substance abuse. Get help right away if: You have a severe reaction to a medicine. These symptoms may be an emergency. Get help right away. Call 911. Do not wait to see if the symptoms will go away. Do not drive yourself to the hospital. Take one of these steps if you feel like you may hurt yourself or others, or have thoughts about taking your own life: Go to your nearest emergency room. Call 911. Call the National Suicide Prevention Lifeline at (989) 115-0802 or 988. This is open 24 hours a day. Text the Crisis Text Line at 802-008-3988. Summary With treatment and support, you can live with ADHD and manage your symptoms. Consider taking part in family therapy or self-help groups with family members or friends. When you talk with friends and family about your ADHD, be patient and communicate openly. Keep all follow-up visits. Your health care provider will need to monitor your condition and adjust your treatment over time. This information is not intended to replace advice given to you by your health care provider. Make sure you discuss any questions you have with your health care provider. Document Revised: 06/05/2021 Document Reviewed: 06/05/2021 Elsevier Patient Education  2024 [employer].

## 2023-06-24 NOTE — Progress Notes (Signed)
 Established Patient Office Visit  Subjective   Patient ID: Marisa Humphrey, female    DOB: 1980-05-25  Age: 43 y.o. MRN: [id number]  Chief Complaint  Patient presents with   Annual Exam    Pt states not fasting    HPI Discussed the use of AI scribe software for clinical note transcription with the patient, who gave verbal consent to proceed.  History of Present Illness Marisa Humphrey is a 43 year old female who presents for an annual physical exam.  She feels tired and achy, which she attributes to her recent weight gain. She has gained approximately 15 pounds since discontinuing weight loss injections last April, after previously losing 50 pounds with the injections. She is interested in weight loss surgery but her insurance only covers one weight loss surgery per lifetime, and she has already undergone a lap band procedure.  She continues to smoke and is considering restarting Wellbutrin  to aid in smoking cessation, as it has been helpful in the past. She is also contemplating the use of Chantix  but is concerned about potential side effects.  She has a history of anxiety and has stopped taking Zoloft , believing it was not effective. However, she acknowledges her anxiety is severe and is considering restarting the medication.  Her knee is swollen but not painful, which she attributes to her weight. She mentions a previous suggestion to consult an orthopedic specialist.  Her 36 year old daughter is struggling with weight issues and has a referral to [facility] for evaluation. Her daughter is also experiencing symptoms suggestive of PCOS, such as dark hair growth and early puberty.   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   Viral gastroenteritis 11/19/2014   Severe obesity (BMI >= 40) (HCC) 01/15/2014   Indication for care or intervention related to labor and delivery 09/23/2013   Postpartum care following vaginal delivery (7/26) 09/23/2013   SVD (spontaneous vaginal delivery) 09/23/2013   Morbid obesity (HCC) 11/30/2012   BACK PAIN 10/31/2009   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   BACK STRAIN 12/27/2007   BENIGN NEOPLASM OF EAR&EXTERNAL AUDITORY CANAL 06/06/2007   GERD 06/06/2007   Depression, major, single episode, complete remission (HCC) 11/14/2006   Past Medical History:  Diagnosis Date   ADD (attention deficit disorder)    Anxiety    Depressed    Depression    Edema of both lower legs    Foot pain    GERD (gastroesophageal reflux disease)    Headache    History of condyloma acuminatum    Joint pain    Knee pain    Sleep apnea    Past Surgical History:  Procedure Laterality Date   EXCISION BONE CYST Left 06/05/2019   Procedure: EXCISION BONE CYST;  Surgeon: Brunilda Capra, MD;  Location: [facility];  Service: Orthopedics;  Laterality: Left;   LAPAROSCOPIC GASTRIC BANDING  11/09/2005   Dr.Martin   MOLE REMOVAL     OPEN REDUCTION INTERNAL FIXATION (ORIF) FINGER WITH RADIAL BONE GRAFT Left 06/05/2019   Procedure: OPEN REDUCTION PIN FIXATION, CURRETAGE BONE LESION WITH CANCELLOUS CHIP BONE GRAFTING;  Surgeon:  Brunilda Capra, MD;  Location: [facility];  Service: Orthopedics;  Laterality: Left;   Social History   Tobacco Use   Smoking status: Some Days    Types: Cigarettes   Smokeless tobacco: Never  Vaping Use   Vaping status: Never Used  Substance Use Topics   Alcohol use: Yes    Comment: occ   Drug use: No   Social History   Socioeconomic History   Marital status: Legally Separated     Spouse name: Larinda Plover   Number of children: 1   Years of education: Not on file   Highest education level: Not on file  Occupational History   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  Other Topics Concern   Not on file  Social History Narrative   Exercising --no   Social Drivers of Corporate investment banker Strain: Not on file  Food Insecurity: Not on file  Transportation Needs: Not on file  Physical Activity: Not on file  Stress: Not on file  Social Connections: Not on file  Intimate Partner Violence: Not on file   Family Status  Relation Name Status   Mother  Alive   Father  Deceased   Brother  Alive   MGM  Alive   Other mothers aunt Alive   Pat Aunt  Alive  No partnership data on file   Family History  Problem Relation Age of Onset   Diabetes Mother    Hypertension Mother    Hyperlipidemia Mother    Depression Mother    Anxiety disorder Mother    Bipolar disorder Mother    Alcoholism Mother    Heart attack Father 66   Heart disease Father 30       MI   High Cholesterol Father    Sudden death Father    Alcoholism Father    Multiple sclerosis Brother    Diabetes Maternal Grandmother    Cancer Other        breast   Breast cancer Paternal Aunt    Allergies  Allergen Reactions   Penicillins Rash    Amoxil  has been taken w/o reaction      Review of Systems  Constitutional:  Negative for chills, fever and malaise/fatigue.  HENT:  Negative for congestion and hearing loss.   Eyes:  Negative for blurred vision and discharge.  Respiratory:  Negative for cough, sputum production and shortness of breath.    Cardiovascular:  Negative for chest pain, palpitations and leg swelling.  Gastrointestinal:  Negative for abdominal pain, blood in stool, constipation, diarrhea, heartburn, nausea and vomiting.  Genitourinary:  Negative for dysuria, frequency, hematuria and urgency.  Musculoskeletal:  Negative for back pain, falls and myalgias.  Skin:  Negative for rash.  Neurological:  Negative for dizziness, sensory change, loss of consciousness, weakness and headaches.  Endo/Heme/Allergies:  Negative for environmental allergies. Does not bruise/bleed easily.  Psychiatric/Behavioral:  Negative for depression and suicidal ideas. The patient is not nervous/anxious and does not have insomnia.       Objective:  BP (!) 134/92 (BP Location: Right Arm, Patient Position: Sitting, Cuff Size: Normal)   Pulse 81   Temp 98.5 F (36.9 C) (Oral)   Resp 18   Ht 5\' 7"  (1.702 m)   Wt 248 lb 12.8 oz (112.9 kg)   SpO2 99%   BMI 38.97 kg/m  BP Readings from Last 3 Encounters:  06/24/23 (!) 134/92  01/20/23 (!) 145/92  12/08/22 120/86   Wt Readings from Last 3 Encounters:  06/24/23 248 lb 12.8 oz (112.9 kg)  01/20/23 230 lb (104.3 kg)  06/28/22 235 lb 12.8 oz (107 kg)   SpO2 Readings from Last 3 Encounters:  06/24/23 99%  01/20/23 100%  12/08/22 97%      Physical Exam Vitals and nursing note reviewed.  Constitutional:      General: She is not in acute distress.    Appearance: Normal appearance. She is well-developed.  HENT:     Head: Normocephalic and atraumatic.     Right Ear: Tympanic membrane, ear canal and external ear normal. There is no impacted cerumen.     Left Ear: Tympanic membrane, ear canal and external ear normal. There is no impacted cerumen.     Nose: Nose normal.     Mouth/Throat:     Mouth: Mucous membranes are moist.     Pharynx: Oropharynx is clear. No oropharyngeal exudate or posterior oropharyngeal erythema.  Eyes:     General: No scleral icterus.       Right eye: No  discharge.        Left eye: No discharge.     Conjunctiva/sclera: Conjunctivae normal.     Pupils: Pupils are equal, round, and reactive to light.  Neck:     Thyroid : No thyromegaly or thyroid  tenderness.     Vascular: No JVD.  Cardiovascular:     Rate and Rhythm: Normal rate and regular rhythm.     Heart sounds: Normal heart sounds. No murmur heard. Pulmonary:     Effort: Pulmonary effort is normal. No respiratory distress.     Breath sounds: Normal breath sounds.  Abdominal:     General: Bowel sounds are normal. There is no distension.     Palpations: Abdomen is soft. There is no mass.     Tenderness: There is no abdominal tenderness. There is no guarding or rebound.  Genitourinary:    Vagina: Normal.  Musculoskeletal:        General: Normal range of motion.     Cervical back: Normal range of motion and neck supple.     Right lower leg: No edema.     Left lower leg: No edema.  Lymphadenopathy:     Cervical: No cervical adenopathy.  Skin:    General: Skin is warm and dry.     Findings: No erythema or rash.  Neurological:     Mental Status: She is alert and oriented to person, place, and time.     Cranial Nerves: No cranial nerve deficit.     Deep Tendon Reflexes: Reflexes are normal and symmetric.  Psychiatric:        Mood and Affect: Mood normal.        Behavior: Behavior normal.        Thought Content: Thought content normal.        Judgment: Judgment normal.      No results found for any visits on 06/24/23.  Last CBC Lab Results  Component Value Date   WBC 10.5 12/29/2021   HGB 13.8 12/29/2021  HCT 41.7 12/29/2021   MCV 95.1 12/29/2021   MCH 30.4 03/20/2020   RDW 14.1 12/29/2021   PLT 390.0 12/29/2021   Last metabolic panel Lab Results  Component Value Date   GLUCOSE 70 12/29/2021   NA 138 12/29/2021   K 3.7 12/29/2021   CL 103 12/29/2021   CO2 28 12/29/2021   BUN 12 12/29/2021   CREATININE 1.17 12/29/2021   EGFR 72.0 06/17/2022   CALCIUM 9.0  12/29/2021   PROT 6.3 12/29/2021   ALBUMIN 3.9 12/29/2021   LABGLOB 2.3 03/20/2020   AGRATIO 2.0 03/20/2020   BILITOT 0.3 12/29/2021   ALKPHOS 61 12/29/2021   AST 9 12/29/2021   ALT 11 12/29/2021   ANIONGAP 7 01/20/2015   Last lipids Lab Results  Component Value Date   CHOL 102 12/29/2021   HDL 38.00 (L) 12/29/2021   LDLCALC 37 12/29/2021   TRIG 136.0 12/29/2021   CHOLHDL 3 12/29/2021   Last hemoglobin A1c Lab Results  Component Value Date   HGBA1C 5.1 12/29/2021   Last thyroid  functions Lab Results  Component Value Date   TSH 1.45 12/29/2021   Last vitamin D  Lab Results  Component Value Date   VD25OH 28.63 (L) 12/29/2021   Last vitamin B12 and Folate Lab Results  Component Value Date   VITAMINB12 359 12/29/2021   FOLATE 8.0 01/09/2008      The ASCVD Risk score (Arnett DK, et al., 2019) failed to calculate for the following reasons:   The valid total cholesterol range is 130 to 320 mg/dL    Assessment & Plan:   Problem List Items Addressed This Visit       Unprioritized   Attention deficit   Relevant Medications   amphetamine -dextroamphetamine  (ADDERALL ) 30 MG tablet   Rosacea   Relevant Medications   METRONIDAZOLE , TOPICAL, 0.75 % LOTN   Preventative health care - Primary   Relevant Orders   CBC with Differential/Platelet   Comprehensive metabolic panel with GFR   Lipid panel   TSH   Other Visit Diagnoses       Anxiety       Relevant Medications   ALPRAZolam  (XANAX ) 0.25 MG tablet   sertraline  (ZOLOFT ) 50 MG tablet     High risk medication use       Relevant Orders   Drug Monitoring Panel [phone] , Urine     Tobacco abuse       Relevant Medications   Varenicline  Tartrate, Starter, (CHANTIX  STARTING MONTH PAK) 0.5 MG X 11 & 1 MG X 42 TBPK     Assessment and Plan Assessment & Plan Obesity   She experiences obesity with cycles of weight loss and regain. She has previously used weight loss medications and is interested in gastric bypass  surgery, but faces challenges with insurance coverage for treatments and surgery. Rho was explored for weight loss medications. A potential revision of her lap band surgery is limited by [facility]'s policy of one weight loss surgery per lifetime. She will consult with a specialist for weight management options, explore Rho for medication, and schedule a follow-up for evaluation.  Knee pain   She has chronic knee pain with swelling, likely related to her weight. A referral to orthopedics is planned for further evaluation and management.  Nicotine dependence   She has nicotine dependence and previously used Wellbutrin  for smoking cessation. Chantix  was discussed, including side effects like vivid dreams. Smoking cessation strategies were reviewed, including setting a quit date and gradual reduction while on  Chantix , which can be started with continued smoking for gradual cessation. Chantix  will be prescribed, and she is advised to set a quit date and discuss side effects.  Anxiety disorder   She has an anxiety disorder and previously used Zoloft , which was discontinued due to perceived lack of efficacy. Current symptoms are related to life stressors. Restarting Zoloft  for management was discussed, and it will be prescribed.  Wellness Visit   During her routine wellness visit, family history updates were noted: a paternal aunt with breast cancer and her mother's high platelet count under oncology follow-up. Current medications and lifestyle factors were reviewed. Routine lab tests will be ordered, and prescriptions for alprazolam , Adderall , and metronidazole  will be refilled. Family history updates were discussed.    No follow-ups on file.    Akashdeep Chuba R Lowne Chase, DO

## 2023-06-25 ENCOUNTER — Other Ambulatory Visit: Payer: Self-pay

## 2023-06-26 ENCOUNTER — Telehealth: Admitting: Physician Assistant

## 2023-06-26 ENCOUNTER — Encounter: Payer: Self-pay | Admitting: Physician Assistant

## 2023-06-26 DIAGNOSIS — Z7251 High risk heterosexual behavior: Secondary | ICD-10-CM

## 2023-06-26 NOTE — Progress Notes (Signed)
 Good afternoon Marisa Humphrey,  Plan B is offered over the counter.  It is not a prescription.    You will not be charged for this evisit.    Please let us  know if there is anything else we can do for you.  I have spent 5 minutes in review of e-visit questionnaire, review and updating patient chart, medical decision making and response to patient.   Etter Hermann Mayers, PA-C

## 2023-06-27 ENCOUNTER — Other Ambulatory Visit (HOSPITAL_COMMUNITY): Payer: Self-pay

## 2023-06-27 LAB — DM TEMPLATE

## 2023-06-28 LAB — DRUG MONITORING PANEL 376104, URINE
Barbiturates: NEGATIVE ng/mL (ref <300)
Benzodiazepines: NEGATIVE ng/mL (ref <100)
Cocaine Metabolite: NEGATIVE ng/mL (ref <150)
Desmethyltramadol: NEGATIVE ng/mL (ref <100)
Methamphetamine: NEGATIVE ng/mL (ref <250)
Opiates: NEGATIVE ng/mL (ref <100)
Oxycodone: NEGATIVE ng/mL (ref <100)
Tramadol Comments: 1464 ng/mL — ABNORMAL HIGH (ref <250)
Tramadol: NEGATIVE ng/mL (ref <100)
Tramadol: POSITIVE ng/mL — AB (ref <500)
medMATCH Summary: NEGATIVE ng/mL (ref <100)

## 2023-06-28 LAB — DM TEMPLATE

## 2023-07-07 ENCOUNTER — Other Ambulatory Visit (HOSPITAL_COMMUNITY): Payer: Self-pay

## 2023-07-07 DIAGNOSIS — Z716 Tobacco abuse counseling: Secondary | ICD-10-CM | POA: Diagnosis not present

## 2023-07-07 DIAGNOSIS — Z1331 Encounter for screening for depression: Secondary | ICD-10-CM | POA: Diagnosis not present

## 2023-07-07 DIAGNOSIS — Z01419 Encounter for gynecological examination (general) (routine) without abnormal findings: Secondary | ICD-10-CM | POA: Diagnosis not present

## 2023-07-07 DIAGNOSIS — Z114 Encounter for screening for human immunodeficiency virus [HIV]: Secondary | ICD-10-CM | POA: Diagnosis not present

## 2023-07-07 DIAGNOSIS — Z1159 Encounter for screening for other viral diseases: Secondary | ICD-10-CM | POA: Diagnosis not present

## 2023-07-07 DIAGNOSIS — Z113 Encounter for screening for infections with a predominantly sexual mode of transmission: Secondary | ICD-10-CM | POA: Diagnosis not present

## 2023-07-07 DIAGNOSIS — Z118 Encounter for screening for other infectious and parasitic diseases: Secondary | ICD-10-CM | POA: Diagnosis not present

## 2023-07-07 DIAGNOSIS — Z124 Encounter for screening for malignant neoplasm of cervix: Secondary | ICD-10-CM | POA: Diagnosis not present

## 2023-07-07 MED ORDER — FOLIC ACID 1 MG PO TABS
1.0000 mg | ORAL_TABLET | Freq: Every day | ORAL | 3 refills | Status: AC
  Filled 2023-07-07: qty 90, 90d supply, fill #0

## 2023-07-11 ENCOUNTER — Other Ambulatory Visit: Payer: Self-pay | Admitting: Obstetrics

## 2023-07-11 DIAGNOSIS — Z9189 Other specified personal risk factors, not elsewhere classified: Secondary | ICD-10-CM

## 2023-07-12 LAB — HM PAP SMEAR: HPV, high-risk: NEGATIVE

## 2023-07-15 ENCOUNTER — Other Ambulatory Visit (HOSPITAL_COMMUNITY): Payer: Self-pay

## 2023-07-20 ENCOUNTER — Other Ambulatory Visit: Payer: Self-pay | Admitting: Family Medicine

## 2023-07-20 DIAGNOSIS — F419 Anxiety disorder, unspecified: Secondary | ICD-10-CM

## 2023-07-20 DIAGNOSIS — R41840 Attention and concentration deficit: Secondary | ICD-10-CM

## 2023-07-20 NOTE — Telephone Encounter (Signed)
 Requesting: Xanax  & Adderall  Contract: 06/24/23 UDS: 06/24/23 Last OV: 06/24/23 Next OV: n/a Last Refill: 06/24/23, #20--0RF and #60--0 RF Database:   Please advise

## 2023-07-21 ENCOUNTER — Ambulatory Visit
Admission: RE | Admit: 2023-07-21 | Discharge: 2023-07-21 | Disposition: A | Discharge status: Home / Self Care | Source: Ambulatory Visit | Attending: Obstetrics | Admitting: Obstetrics

## 2023-07-21 ENCOUNTER — Other Ambulatory Visit (HOSPITAL_COMMUNITY): Payer: Self-pay

## 2023-07-21 DIAGNOSIS — Z9189 Other specified personal risk factors, not elsewhere classified: Secondary | ICD-10-CM

## 2023-07-21 MED ORDER — AMPHETAMINE-DEXTROAMPHETAMINE 30 MG PO TABS
30.0000 mg | ORAL_TABLET | Freq: Two times a day (BID) | ORAL | 0 refills | Status: DC
Start: 2023-07-21 — End: 2023-08-29
  Filled 2023-07-21 – 2023-07-26 (×2): qty 60, 30d supply, fill #0

## 2023-07-21 MED ORDER — ALPRAZOLAM 0.25 MG PO TABS
0.2500 mg | ORAL_TABLET | Freq: Two times a day (BID) | ORAL | 0 refills | Status: DC | PRN
Start: 2023-07-21 — End: 2023-08-15
  Filled 2023-07-21: qty 20, 10d supply, fill #0

## 2023-07-26 ENCOUNTER — Other Ambulatory Visit (HOSPITAL_COMMUNITY): Payer: Self-pay

## 2023-08-08 ENCOUNTER — Other Ambulatory Visit (HOSPITAL_COMMUNITY): Payer: Self-pay

## 2023-08-08 ENCOUNTER — Telehealth: Admitting: Family Medicine

## 2023-08-08 DIAGNOSIS — H60331 Swimmer's ear, right ear: Secondary | ICD-10-CM

## 2023-08-08 MED ORDER — CIPROFLOXACIN-DEXAMETHASONE 0.3-0.1 % OT SUSP
4.0000 [drp] | Freq: Two times a day (BID) | OTIC | 0 refills | Status: DC
Start: 2023-08-08 — End: 2023-11-17
  Filled 2023-08-08: qty 7.5, 15d supply, fill #0

## 2023-08-08 NOTE — Progress Notes (Signed)
E Visit for Ear Pain - Swimmer's Ear  We are sorry that you are not feeling well. Here is how we plan to help!  Based on what you have shared with me it looks like you have Swimmer's Ear.  Swimmer's ear is a redness or swelling, irritation, or infection of your outer ear canal. These symptoms usually occur within a few days of swimming. Your ear canal is a tube that goes from the opening of the ear to the eardrum.  When water stays in your ear canal, germs can grow.  This is a painful condition that often happens to children and swimmers of all ages.  It is not contagious and oral antibiotics are not required to treat uncomplicated swimmer's ear.  The usual symptoms include:    Itchiness inside the ear  Redness or a sense of swelling in the ear  Pain when the ear is tugged on when pressure is placed on the ear  Pus draining from the infected ear   I have prescribed: Ciprofloxin 0.2% and hydrocortisone 1% otic suspension 3 drops in affected ears twice daily for 7 days  In certain cases, swimmer's ear may progress to a more serious bacterial infection of the middle or inner ear.  If you have a fever 102 and up and significantly worsening symptoms, this could indicate a more serious infection moving to the middle/inner and needs face to face evaluation in an office by a provider.  Your symptoms should improve over the next 3 days and should resolve in about 7 days.  Be sure to complete ALL of your prescription.  HOME CARE: Wash your hands frequently. If you are prescribed an ear drop, do not place the tip of the bottle on your ear or touch it with your fingers. You can take Acetaminophen 650 mg every 4-6 hours as needed for pain.  If pain is severe or moderate, you can apply a heating pad (set on low) or hot water bottle (wrapped in a towel) to outer ear for 20 minutes.  This will also increase drainage. Avoid ear plugs Do not go swimming until the symptoms are gone Do not use Q-tips After  showers, help the water run out by tilting your head to one side.   GET HELP RIGHT AWAY IF: Fever is over 102.2 degrees. You develop progressive ear pain or hearing loss. Ear symptoms persist longer than 3 days after treatment.  MAKE SURE YOU: Understand these instructions. Will watch your condition. Will get help right away if you are not doing well or get worse.  TO PREVENT SWIMMER'S EAR: Use a bathing cap or custom fitted swim molds to keep your ears dry. Towel off after swimming to dry your ears. Tilt your head or pull your earlobes to allow the water to escape your ear canal. If there is still water in your ears, consider using a hairdryer on the lowest setting.  Thank you for choosing an e-visit.  Your e-visit answers were reviewed by a board certified advanced clinical practitioner to complete your personal care plan. Depending upon the condition, your plan could have included both over the counter or prescription medications.  Please review your pharmacy choice. Make sure the pharmacy is open so you can pick up the prescription now. If there is a problem, you may contact your provider through [employer] and have the prescription routed to another pharmacy.  Your safety is important to [place]. If you have drug allergies check your prescription carefully.  For the next 24 hours you can use MyChart to ask questions about today's visit, request a non-urgent call back, or ask for a work or school excuse. You will get an email with a survey after your eVisit asking about your experience. We would appreciate your feedback. I hope that your e-visit has been valuable and will aid in your recovery.  I provided 5 minutes of non face-to-face time during this encounter for chart review, medication and order placement, as well as and documentation.

## 2023-08-15 ENCOUNTER — Other Ambulatory Visit: Payer: Self-pay | Admitting: Family Medicine

## 2023-08-15 DIAGNOSIS — F419 Anxiety disorder, unspecified: Secondary | ICD-10-CM

## 2023-08-16 ENCOUNTER — Other Ambulatory Visit: Payer: Self-pay

## 2023-08-16 NOTE — Telephone Encounter (Signed)
 Requesting: alprazolam   Contract: 06/24/23 UDS: 06/24/23 Last Visit:  06/24/23 Next Visit: None Last Refill: 07/21/23 #20 and 0RF   Please Advise

## 2023-08-18 ENCOUNTER — Other Ambulatory Visit (HOSPITAL_COMMUNITY): Payer: Self-pay

## 2023-08-18 MED ORDER — ALPRAZOLAM 0.25 MG PO TABS
0.2500 mg | ORAL_TABLET | Freq: Two times a day (BID) | ORAL | 0 refills | Status: DC | PRN
Start: 2023-08-18 — End: 2023-09-13
  Filled 2023-08-18: qty 20, 10d supply, fill #0

## 2023-08-29 ENCOUNTER — Other Ambulatory Visit: Payer: Self-pay | Admitting: Family Medicine

## 2023-08-29 DIAGNOSIS — R41840 Attention and concentration deficit: Secondary | ICD-10-CM

## 2023-08-29 NOTE — Telephone Encounter (Signed)
 Requesting: Adderall  30MG   Contract: 06/24/23 UDS: 06/24/23 Last Visit: 06/24/23 Next Visit: None Last Refill: 07/21/23 #60 and 0RF   Please Advise

## 2023-08-30 ENCOUNTER — Other Ambulatory Visit (HOSPITAL_COMMUNITY): Payer: Self-pay | Admitting: Surgery

## 2023-08-30 ENCOUNTER — Encounter (HOSPITAL_COMMUNITY): Payer: Self-pay

## 2023-08-30 ENCOUNTER — Other Ambulatory Visit (HOSPITAL_COMMUNITY): Payer: Self-pay

## 2023-08-30 DIAGNOSIS — K219 Gastro-esophageal reflux disease without esophagitis: Secondary | ICD-10-CM | POA: Diagnosis not present

## 2023-08-30 DIAGNOSIS — Z9884 Bariatric surgery status: Secondary | ICD-10-CM | POA: Diagnosis not present

## 2023-08-30 MED ORDER — AMPHETAMINE-DEXTROAMPHETAMINE 30 MG PO TABS
30.0000 mg | ORAL_TABLET | Freq: Two times a day (BID) | ORAL | 0 refills | Status: DC
Start: 2023-08-30 — End: 2023-09-29
  Filled 2023-08-30: qty 1, 1d supply, fill #0
  Filled 2023-08-30: qty 59, 29d supply, fill #0

## 2023-08-31 ENCOUNTER — Other Ambulatory Visit (HOSPITAL_COMMUNITY): Payer: Self-pay

## 2023-09-09 ENCOUNTER — Ambulatory Visit (HOSPITAL_COMMUNITY)
Admission: RE | Admit: 2023-09-09 | Discharge: 2023-09-09 | Disposition: A | Discharge status: Home / Self Care | Source: Ambulatory Visit | Attending: Surgery | Admitting: Surgery

## 2023-09-09 DIAGNOSIS — K219 Gastro-esophageal reflux disease without esophagitis: Secondary | ICD-10-CM | POA: Diagnosis not present

## 2023-09-09 DIAGNOSIS — Z4682 Encounter for fitting and adjustment of non-vascular catheter: Secondary | ICD-10-CM | POA: Diagnosis not present

## 2023-09-13 ENCOUNTER — Other Ambulatory Visit (HOSPITAL_COMMUNITY): Payer: Self-pay

## 2023-09-13 ENCOUNTER — Other Ambulatory Visit: Payer: Self-pay | Admitting: Family Medicine

## 2023-09-13 DIAGNOSIS — F419 Anxiety disorder, unspecified: Secondary | ICD-10-CM

## 2023-09-13 MED ORDER — ALPRAZOLAM 0.25 MG PO TABS
0.2500 mg | ORAL_TABLET | Freq: Two times a day (BID) | ORAL | 0 refills | Status: DC | PRN
Start: 2023-09-13 — End: 2023-10-27
  Filled 2023-09-13: qty 20, 10d supply, fill #0

## 2023-09-13 NOTE — Telephone Encounter (Signed)
 Requesting: alprazolam   Contract: 06/24/23 UDS: 06/24/23 Last Visit: 06/24/23 Next Visit: None Last Refill: 08/18/23 #20 and 0RF   Please Advise

## 2023-09-28 ENCOUNTER — Ambulatory Visit: Payer: Self-pay | Admitting: Family

## 2023-09-29 ENCOUNTER — Other Ambulatory Visit: Payer: Self-pay | Admitting: Family Medicine

## 2023-09-29 ENCOUNTER — Other Ambulatory Visit (HOSPITAL_COMMUNITY): Payer: Self-pay

## 2023-09-29 DIAGNOSIS — R41840 Attention and concentration deficit: Secondary | ICD-10-CM

## 2023-09-29 MED ORDER — AMPHETAMINE-DEXTROAMPHETAMINE 30 MG PO TABS
30.0000 mg | ORAL_TABLET | Freq: Two times a day (BID) | ORAL | 0 refills | Status: DC
  Filled 2023-09-29: qty 60, 30d supply, fill #0

## 2023-09-29 NOTE — Telephone Encounter (Signed)
 Name of Medication: Adderall  30mg  Name of [facility]  Last Fill or Written Date and Quantity: 08/30/23 60tab caretha Last Office Visit and Type: 06/24/23 Physical  Next Office Visit and Type:  Last Controlled Substance Agreement Date: 09/16/20 Last UDS: 06/24/23

## 2023-10-12 ENCOUNTER — Encounter: Payer: Self-pay | Admitting: Family Medicine

## 2023-10-13 ENCOUNTER — Other Ambulatory Visit: Payer: Self-pay | Admitting: Family

## 2023-10-13 ENCOUNTER — Encounter: Payer: Self-pay | Admitting: Gastroenterology

## 2023-10-13 DIAGNOSIS — F419 Anxiety disorder, unspecified: Secondary | ICD-10-CM

## 2023-10-14 ENCOUNTER — Other Ambulatory Visit (HOSPITAL_COMMUNITY): Payer: Self-pay

## 2023-10-20 ENCOUNTER — Other Ambulatory Visit: Payer: Self-pay | Admitting: Family

## 2023-10-20 ENCOUNTER — Other Ambulatory Visit (HOSPITAL_COMMUNITY): Payer: Self-pay

## 2023-10-20 DIAGNOSIS — F419 Anxiety disorder, unspecified: Secondary | ICD-10-CM

## 2023-10-21 ENCOUNTER — Encounter (HOSPITAL_COMMUNITY): Payer: Self-pay

## 2023-10-21 ENCOUNTER — Other Ambulatory Visit: Payer: Self-pay | Admitting: Family Medicine

## 2023-10-21 ENCOUNTER — Other Ambulatory Visit (HOSPITAL_COMMUNITY): Payer: Self-pay

## 2023-10-21 DIAGNOSIS — F419 Anxiety disorder, unspecified: Secondary | ICD-10-CM

## 2023-10-21 NOTE — Telephone Encounter (Signed)
 Requesting: alprazlam 0.25mg   Contract: 06/24/23 UDS: 06/24/23 Last Visit: 06/24/23 Next Visit: None Last Refill: 09/13/23 #20 and 0RF   Please Advise

## 2023-10-24 ENCOUNTER — Other Ambulatory Visit: Payer: Self-pay | Admitting: Family

## 2023-10-24 ENCOUNTER — Other Ambulatory Visit (HOSPITAL_COMMUNITY): Payer: Self-pay

## 2023-10-24 DIAGNOSIS — F419 Anxiety disorder, unspecified: Secondary | ICD-10-CM

## 2023-10-26 ENCOUNTER — Other Ambulatory Visit: Payer: Self-pay | Admitting: Family

## 2023-10-26 ENCOUNTER — Encounter: Payer: Self-pay | Admitting: Family Medicine

## 2023-10-26 DIAGNOSIS — R41840 Attention and concentration deficit: Secondary | ICD-10-CM

## 2023-10-26 DIAGNOSIS — F419 Anxiety disorder, unspecified: Secondary | ICD-10-CM

## 2023-10-27 ENCOUNTER — Other Ambulatory Visit: Payer: Self-pay | Admitting: Family Medicine

## 2023-10-27 ENCOUNTER — Other Ambulatory Visit (HOSPITAL_COMMUNITY): Payer: Self-pay

## 2023-10-27 ENCOUNTER — Encounter (HOSPITAL_COMMUNITY): Payer: Self-pay

## 2023-10-27 DIAGNOSIS — F419 Anxiety disorder, unspecified: Secondary | ICD-10-CM

## 2023-10-27 DIAGNOSIS — R41840 Attention and concentration deficit: Secondary | ICD-10-CM

## 2023-10-27 MED ORDER — ALPRAZOLAM 0.25 MG PO TABS
0.2500 mg | ORAL_TABLET | Freq: Two times a day (BID) | ORAL | 0 refills | Status: DC | PRN
Start: 2023-10-27 — End: 2023-11-23
  Filled 2023-10-27: qty 20, 10d supply, fill #0

## 2023-10-27 MED ORDER — AMPHETAMINE-DEXTROAMPHETAMINE 30 MG PO TABS
30.0000 mg | ORAL_TABLET | Freq: Two times a day (BID) | ORAL | 0 refills | Status: DC
  Filled 2023-10-27: qty 60, 30d supply, fill #0

## 2023-10-27 NOTE — Telephone Encounter (Signed)
 Please refill medication

## 2023-10-27 NOTE — Telephone Encounter (Signed)
 Requesting: Xanax  & Adderall  XR Contract:  06/24/2023 UDS: 06/24/2023 Last OV: 06/24/2023 Next OV: n/a Last Refill: 09/13/2023, #20--0 RF, 09/29/2023, #60--0 RF Database:   Please advise

## 2023-11-17 ENCOUNTER — Ambulatory Visit
Admission: RE | Admit: 2023-11-17 | Discharge: 2023-11-17 | Disposition: A | Discharge status: Home / Self Care | Attending: Family Medicine | Admitting: Family Medicine

## 2023-11-17 ENCOUNTER — Other Ambulatory Visit: Payer: Self-pay

## 2023-11-17 VITALS — BP 159/109 | HR 93 | Temp 98.6°F | Resp 16

## 2023-11-17 DIAGNOSIS — Z202 Contact with and (suspected) exposure to infections with a predominantly sexual mode of transmission: Secondary | ICD-10-CM | POA: Insufficient documentation

## 2023-11-17 DIAGNOSIS — Z113 Encounter for screening for infections with a predominantly sexual mode of transmission: Secondary | ICD-10-CM | POA: Diagnosis not present

## 2023-11-17 NOTE — Discharge Instructions (Signed)
 Check MyChart for your test results On nurse will call you if any of your results are positive

## 2023-11-17 NOTE — ED Triage Notes (Signed)
 Wants std testing. No symptoms. Bloodwork also.

## 2023-11-17 NOTE — ED Provider Notes (Signed)
 [facility]    CSN: [id number] Arrival date & time: 11/17/23  1745      History   Chief Complaint Chief Complaint  Patient presents with   SEXUALLY TRANSMITTED DISEASE    HPI Marisa Humphrey is a 43 y.o. female.   Patient has no complaints.  She states she wants STD testing to be responsible.  Admits to unprotected sexual relations    Past Medical History:  Diagnosis Date   ADD (attention deficit disorder)    Anxiety    Depressed    Depression    Edema of both lower legs    Foot pain    GERD (gastroesophageal reflux disease)    Headache    History of condyloma acuminatum    Joint pain    Knee pain    Sleep apnea     Patient Active Problem List   Diagnosis Date Noted   Preventative health care 06/28/2022   Rosacea 06/28/2022   Lymphadenopathy, anterior cervical 06/28/2022   Attention deficit 09/16/2020   Vitamin D  deficiency 09/16/2020   Depression with anxiety 04/19/2019   Attention deficit disorder 04/01/2018   Right knee pain 08/22/2017   Depression, major, single episode, moderate (HCC) 04/25/2017   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   Viral gastroenteritis 11/19/2014   Severe obesity (BMI >= 40) (HCC) 01/15/2014   Indication for care or intervention related to labor and delivery 09/23/2013   Postpartum care following vaginal delivery (7/26) 09/23/2013   SVD (spontaneous vaginal delivery) 09/23/2013   Morbid obesity (HCC) 11/30/2012   BACK PAIN 10/31/2009   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   BACK STRAIN 12/27/2007   BENIGN NEOPLASM OF EAR&EXTERNAL AUDITORY CANAL 06/06/2007   GERD 06/06/2007   Depression, major, single episode, complete remission (HCC) 11/14/2006    Past Surgical History:  Procedure Laterality Date   EXCISION BONE CYST Left 06/05/2019   Procedure: EXCISION BONE CYST;  Surgeon: Murrell Drivers, MD;  Location: [facility];  Service: Orthopedics;  Laterality: Left;    LAPAROSCOPIC GASTRIC BANDING  11/09/2005   Dr.Martin   MOLE REMOVAL     OPEN REDUCTION INTERNAL FIXATION (ORIF) FINGER WITH RADIAL BONE GRAFT Left 06/05/2019   Procedure: OPEN REDUCTION PIN FIXATION, CURRETAGE BONE LESION WITH CANCELLOUS CHIP BONE GRAFTING;  Surgeon: Murrell Drivers, MD;  Location:  SURGERY CENTER;  Service: Orthopedics;  Laterality: Left;    OB History     Gravida  3   Para  2   Term  2   Preterm      AB  1   Living  2      SAB  1   IAB      Ectopic      Multiple      Live Births  2            Home Medications    Prior to Admission medications   Medication Sig Start Date End Date Taking? Authorizing Provider  acetaminophen  (TYLENOL ) 500 MG tablet Take 1,000 mg by mouth every 8 (eight) hours as needed.    [provider]  ALPRAZolam  (XANAX ) 0.25 MG tablet Take 1 tablet (0.25 mg total) by mouth 2 (two) times daily as needed for anxiety. 10/27/23   [name] Rockers R, DO  folic acid  (FOLVITE ) 1 MG tablet Take 1 tablet (1 mg total) by mouth daily. 07/07/23     ibuprofen  (ADVIL ) 800 MG tablet Take 800 mg by mouth every 8 (eight) hours as  needed.    [provider]  levonorgestrel (MIRENA) 20 MCG/24HR IUD 1 each by Intrauterine route once.    [provider]  sertraline  (ZOLOFT ) 50 MG tablet Take 1 tablet (50 mg total) by mouth daily. 06/24/23   [name] [name], DO  TUBERCULIN SYR 1CC/27GX1/2 (B-D TB SYRINGE 1CC/27GX1/2) 27G X 1/2 1 ML MISC use as directed to inject B-12 01/10/23   Lowne Chase, Dandrea Medders R, DO  Vitamin D , Ergocalciferol , (DRISDOL ) 1.25 MG (50000 UNIT) CAPS capsule Take 1 capsule (50,000 Units total) by mouth every 7 (seven) days. 01/01/22   [name] [name], DO    Family History Family History  Problem Relation Age of Onset   Diabetes Mother    Hypertension Mother    Hyperlipidemia Mother    Depression Mother    Anxiety disorder Mother    Bipolar disorder Mother    Alcoholism Mother     Heart attack Father 39   Heart disease Father 55       MI   High Cholesterol Father    Sudden death Father    Alcoholism Father    Multiple sclerosis Brother    Diabetes Maternal Grandmother    Cancer Other        breast   Breast cancer Paternal Aunt     Social History Social History   Tobacco Use   Smoking status: Some Days    Types: Cigarettes   Smokeless tobacco: Never  Vaping Use   Vaping status: Never Used  Substance Use Topics   Alcohol use: Yes    Comment: occ   Drug use: No     Allergies   Penicillins   Review of Systems Review of Systems See HPI  Physical Exam Triage Vital Signs ED Triage Vitals  Encounter Vitals Group     BP 11/17/23 1800 (!) 159/109     Girls Systolic BP Percentile --      Girls Diastolic BP Percentile --      Boys Systolic BP Percentile --      Boys Diastolic BP Percentile --      Pulse Rate 11/17/23 1800 93     Resp 11/17/23 1800 16     Temp 11/17/23 1800 98.6 F (37 C)     Temp src --      SpO2 11/17/23 1800 98 %     Weight --      Height --      Head Circumference --      Peak Flow --      Pain Score 11/17/23 1803 0     Pain Loc --      Pain Education --      Exclude from Growth Chart --    No data found.  Updated Vital Signs BP (!) 159/109   Pulse 93   Temp 98.6 F (37 C)   Resp 16   SpO2 98%       Physical Exam Constitutional:      General: She is not in acute distress.    Appearance: She is well-developed.  HENT:     Head: Normocephalic and atraumatic.  Eyes:     Conjunctiva/sclera: Conjunctivae normal.     Pupils: Pupils are equal, round, and reactive to light.  Cardiovascular:     Rate and Rhythm: Normal rate.  Pulmonary:     Effort: Pulmonary effort is normal. No respiratory distress.  Musculoskeletal:        General: Normal range of motion.  Cervical back: Normal range of motion.  Skin:    General: Skin is warm and dry.  Neurological:     Mental Status: She is alert.      UC  Treatments / Results  Labs (all labs ordered are listed, but only abnormal results are displayed) Labs Reviewed  RPR  HIV ANTIBODY (ROUTINE TESTING W REFLEX)  CERVICOVAGINAL ANCILLARY ONLY    EKG   Radiology No results found.  Procedures Procedures (including critical care time)  Medications Ordered in UC Medications - No data to display  Initial Impression / Assessment and Plan / UC Course  I have reviewed the triage vital signs and the nursing notes.  Pertinent labs & imaging results that were available during my care of the patient were reviewed by me and considered in my medical decision making (see chart for details).     Elevated blood pressure discussed with patient.  Recommend repeating when patient is not under stress Final Clinical Impressions(s) / UC Diagnoses   Final diagnoses:  STD exposure  Encounter for assessment of STD exposure     Discharge Instructions      Check MyChart for your test results On nurse will call you if any of your results are positive   ED Prescriptions   None    PDMP not reviewed this encounter.   Maranda Jamee Jacob, MD 11/17/23 [phone]

## 2023-11-18 LAB — RPR: RPR Ser Ql: NONREACTIVE

## 2023-11-18 LAB — HIV ANTIBODY (ROUTINE TESTING W REFLEX): HIV Screen 4th Generation wRfx: NONREACTIVE

## 2023-11-19 ENCOUNTER — Telehealth: Admitting: Nurse Practitioner

## 2023-11-19 DIAGNOSIS — R112 Nausea with vomiting, unspecified: Secondary | ICD-10-CM

## 2023-11-19 MED ORDER — ONDANSETRON HCL 4 MG PO TABS: 4.0000 mg | ORAL_TABLET | Freq: Three times a day (TID) | ORAL | 0 refills | Status: DC | PRN

## 2023-11-19 NOTE — Progress Notes (Signed)
E-Visit for Nausea and Vomiting   We are sorry that you are not feeling well. Here is how we plan to help!  Based on what you have shared with me it looks like you have a Virus that is irritating your GI tract.  Vomiting is the forceful emptying of a portion of the stomach's content through the mouth.  Although nausea and vomiting can make you feel miserable, it's important to remember that these are not diseases, but rather symptoms of an underlying illness.  When we treat short term symptoms, we always caution that any symptoms that persist should be fully evaluated in a medical office.  I have prescribed a medication that will help alleviate your symptoms and allow you to stay hydrated:  Zofran 4 mg 1 tablet every 8 hours as needed for nausea and vomiting  HOME CARE: Drink clear liquids.  This is very important! Dehydration (the lack of fluid) can lead to a serious complication.  Start off with 1 tablespoon every 5 minutes for 8 hours. You may begin eating bland foods after 8 hours without vomiting.  Start with saltine crackers, white bread, rice, mashed potatoes, applesauce. After 48 hours on a bland diet, you may resume a normal diet. Try to go to sleep.  Sleep often empties the stomach and relieves the need to vomit.  GET HELP RIGHT AWAY IF:  Your symptoms do not improve or worsen within 2 days after treatment. You have a fever for over 3 days. You cannot keep down fluids after trying the medication.  MAKE SURE YOU:  Understand these instructions. Will watch your condition. Will get help right away if you are not doing well or get worse.    Thank you for choosing an e-visit.  Your e-visit answers were reviewed by a board certified advanced clinical practitioner to complete your personal care plan. Depending upon the condition, your plan could have included both over the counter or prescription medications.  Please review your pharmacy choice. Make sure the pharmacy is open so  you can pick up prescription now. If there is a problem, you may contact your provider through MyChart messaging and have the prescription routed to another pharmacy.  Your safety is important to us. If you have drug allergies check your prescription carefully.   For the next 24 hours you can use MyChart to ask questions about today's visit, request a non-urgent call back, or ask for a work or school excuse. You will get an email in the next two days asking about your experience. I hope that your e-visit has been valuable and will speed your recovery.  

## 2023-11-19 NOTE — Progress Notes (Signed)
 I have spent 5 minutes in review of e-visit questionnaire, review and updating patient chart, medical decision making and response to patient.   Claiborne Rigg, NP

## 2023-11-21 ENCOUNTER — Ambulatory Visit (HOSPITAL_COMMUNITY): Payer: Self-pay

## 2023-11-21 LAB — CERVICOVAGINAL ANCILLARY ONLY
Bacterial Vaginitis (gardnerella): POSITIVE — AB
Candida Glabrata: NEGATIVE
Candida Vaginitis: NEGATIVE
Chlamydia: NEGATIVE
Comment: NEGATIVE
Comment: NEGATIVE
Comment: NEGATIVE
Comment: NEGATIVE
Comment: NEGATIVE
Comment: NORMAL
Neisseria Gonorrhea: NEGATIVE
Trichomonas: NEGATIVE

## 2023-11-21 MED ORDER — METRONIDAZOLE 500 MG PO TABS: 500.0000 mg | ORAL_TABLET | Freq: Two times a day (BID) | ORAL | 0 refills | Status: AC

## 2023-11-23 ENCOUNTER — Other Ambulatory Visit: Payer: Self-pay | Admitting: Family Medicine

## 2023-11-23 DIAGNOSIS — F419 Anxiety disorder, unspecified: Secondary | ICD-10-CM

## 2023-11-24 ENCOUNTER — Other Ambulatory Visit (HOSPITAL_COMMUNITY): Payer: Self-pay

## 2023-11-24 MED ORDER — ALPRAZOLAM 0.25 MG PO TABS
0.2500 mg | ORAL_TABLET | Freq: Two times a day (BID) | ORAL | 0 refills | Status: DC | PRN
  Filled 2023-11-24: qty 20, 10d supply, fill #0

## 2023-11-24 NOTE — Telephone Encounter (Signed)
 Requesting: alprazolam  0.25mg   Contract:  06/24/23 UDS: 06/24/23 Last Visit: 06/24/23 Next Visit: None Last Refill:  10/27/23 #20 and 0RF   Please Advise

## 2023-12-05 ENCOUNTER — Encounter: Payer: Self-pay | Admitting: Family Medicine

## 2023-12-05 ENCOUNTER — Ambulatory Visit: Admitting: Family Medicine

## 2023-12-05 ENCOUNTER — Other Ambulatory Visit (HOSPITAL_COMMUNITY)
Admission: RE | Admit: 2023-12-05 | Discharge: 2023-12-05 | Disposition: A | Discharge status: Home / Self Care | Source: Ambulatory Visit | Attending: Family Medicine | Admitting: Family Medicine

## 2023-12-05 ENCOUNTER — Other Ambulatory Visit (HOSPITAL_COMMUNITY): Payer: Self-pay

## 2023-12-05 VITALS — BP 136/90 | HR 84 | Temp 98.2°F | Resp 18 | Ht 67.0 in | Wt 236.2 lb

## 2023-12-05 DIAGNOSIS — N760 Acute vaginitis: Secondary | ICD-10-CM | POA: Insufficient documentation

## 2023-12-05 DIAGNOSIS — R0683 Snoring: Secondary | ICD-10-CM

## 2023-12-05 DIAGNOSIS — F419 Anxiety disorder, unspecified: Secondary | ICD-10-CM

## 2023-12-05 DIAGNOSIS — R41840 Attention and concentration deficit: Secondary | ICD-10-CM | POA: Diagnosis not present

## 2023-12-05 MED ORDER — FLUOXETINE HCL 20 MG PO CAPS
20.0000 mg | ORAL_CAPSULE | Freq: Every day | ORAL | 3 refills | Status: DC
  Filled 2023-12-05: qty 90, 90d supply, fill #0

## 2023-12-05 MED ORDER — AMPHETAMINE-DEXTROAMPHETAMINE 30 MG PO TABS: 30.0000 mg | ORAL_TABLET | Freq: Two times a day (BID) | ORAL | Status: DC

## 2023-12-05 MED ORDER — FLUCONAZOLE 150 MG PO TABS
150.0000 mg | ORAL_TABLET | Freq: Every day | ORAL | 0 refills | Status: DC
  Filled 2023-12-05: qty 2, 3d supply, fill #0

## 2023-12-05 NOTE — Progress Notes (Signed)
 Subjective:    Patient ID: Marisa Humphrey, female    DOB: [birth year], 43 y.o.   MRN: [id number]  Chief Complaint  Patient presents with   Anxiety   Follow-up    HPI Patient is in today for anxiey.  Discussed the use of AI scribe software for clinical note transcription with the patient, who gave verbal consent to proceed.  History of Present Illness Marisa Humphrey is a 43 year old female who presents for medication refills and evaluation of anxiety and possible yeast infection.  She experiences anxiety and is seeking an alternative to Zoloft  due to jaw clenching. She has previously tried Wellbutrin  and Zoloft  for anxiety. She is interested in trying Prozac. She is currently taking Adderall  20 mg twice a day and recently had her Xanax  prescription filled.  She suspects a yeast infection, describing symptoms of itching. She recently tested positive for bacterial vaginosis without symptoms and started Metrogel  today. She is willing to perform a self-swab for further evaluation. She reports itching and suspects a yeast infection.  She inquires about Wegovy  coverage, noting that her neighbor with Medicaid previously received it for free. She has a history of sleep apnea, which she suspects contributes to her fatigue. She had a sleep study 18 years ago and used a CPAP machine after a lap band surgery, which helped her lose weight and alleviate symptoms. Reports fatigue, possibly related to sleep apnea.  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    Past Medical History:  Diagnosis Date   ADD (attention deficit disorder)    Anxiety    Depressed    Depression    Edema of both lower legs    Foot pain    GERD (gastroesophageal reflux disease)    Headache    History of condyloma acuminatum    Joint pain    Knee pain    Sleep apnea     Past Surgical History:  Procedure Laterality Date   EXCISION BONE CYST Left  06/05/2019   Procedure: EXCISION BONE CYST;  Surgeon: Murrell Drivers, MD;  Location: [facility];  Service: Orthopedics;  Laterality: Left;   LAPAROSCOPIC GASTRIC BANDING  11/09/2005   Dr.Martin   MOLE REMOVAL     OPEN REDUCTION INTERNAL FIXATION (ORIF) FINGER WITH RADIAL BONE GRAFT Left 06/05/2019   Procedure: OPEN REDUCTION PIN FIXATION, CURRETAGE BONE LESION WITH CANCELLOUS CHIP BONE GRAFTING;  Surgeon: Murrell Drivers, MD;  Location: [facility];  Service: Orthopedics;  Laterality: Left;    Family History  Problem Relation Age of Onset   Diabetes Mother    Hypertension Mother    Hyperlipidemia Mother    Depression Mother    Anxiety disorder Mother    Bipolar disorder Mother    Alcoholism Mother    Heart attack Father 35   Heart disease Father 41       MI   High Cholesterol Father    Sudden death Father    Alcoholism Father    Multiple sclerosis Brother    Diabetes Maternal Grandmother    Cancer Other        breast   Breast cancer Paternal Aunt     Social History   Socioeconomic History   Marital status: Legally Separated    Spouse name: Medford   Number of children: 1   Years of education: Not on file   Highest education level: Not on file  Occupational History  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  Other Topics Concern   Not on file  Social History Narrative   Exercising --no   Social Drivers of Corporate investment banker Strain: Not on file  Food Insecurity: Not on file  Transportation Needs: Not on file  Physical Activity: Not on file  Stress: Not on file  Social Connections: Not on  file  Intimate Partner Violence: Not on file    Outpatient Medications Prior to Visit  Medication Sig Dispense Refill   acetaminophen  (TYLENOL ) 500 MG tablet Take 1,000 mg by mouth every 8 (eight) hours as needed.     ALPRAZolam  (XANAX ) 0.25 MG tablet Take 1 tablet (0.25 mg total) by mouth 2 (two) times daily as needed for anxiety. 20 tablet 0   folic acid  (FOLVITE ) 1 MG tablet Take 1 tablet (1 mg total) by mouth daily. 90 tablet 3   ibuprofen  (ADVIL ) 800 MG tablet Take 800 mg by mouth every 8 (eight) hours as needed.     levonorgestrel (MIRENA) 20 MCG/24HR IUD 1 each by Intrauterine route once.     TUBERCULIN SYR 1CC/27GX1/2 (B-D TB SYRINGE 1CC/27GX1/2) 27G X 1/2 1 ML MISC use as directed to inject B-12 50 each 0   Vitamin D , Ergocalciferol , (DRISDOL ) 1.25 MG (50000 UNIT) CAPS capsule Take 1 capsule (50,000 Units total) by mouth every 7 (seven) days. 12 capsule 1   ondansetron  (ZOFRAN ) 4 MG tablet Take 1 tablet (4 mg total) by mouth every 8 (eight) hours as needed for nausea or vomiting. 20 tablet 0   sertraline  (ZOLOFT ) 50 MG tablet Take 1 tablet (50 mg total) by mouth daily. 30 tablet 3   No facility-administered medications prior to visit.    Allergies  Allergen Reactions   Penicillins Rash    Amoxil  has been taken w/o reaction    Review of Systems  Constitutional:  Negative for fever and malaise/fatigue.  HENT:  Negative for congestion.   Eyes:  Negative for blurred vision.  Respiratory:  Negative for cough and shortness of breath.   Cardiovascular:  Negative for chest pain, palpitations and leg swelling.  Gastrointestinal:  Negative for vomiting.  Musculoskeletal:  Negative for back pain.  Skin:  Negative for rash.  Neurological:  Negative for loss of consciousness and headaches.  Psychiatric/Behavioral:  Negative for memory loss and suicidal ideas. The patient is nervous/anxious.        Objective:    Physical Exam  BP (!) 136/90 (BP Location: Right Arm, Patient  Position: Sitting, Cuff Size: Normal)   Pulse 84   Temp 98.2 F (36.8 C) (Oral)   Resp 18   Ht 5' 7 (1.702 m)   Wt 236 lb 3.2 oz (107.1 kg)   SpO2 97%   BMI 36.99 kg/m  Wt Readings from Last 3 Encounters:  12/05/23 236 lb 3.2 oz (107.1 kg)  06/24/23 248 lb 12.8 oz (112.9 kg)  01/20/23 230 lb (104.3 kg)    Diabetic Foot Exam - Simple   No data filed    Lab Results  Component  Value Date   WBC 8.8 06/24/2023   HGB 14.5 06/24/2023   HCT 43.6 06/24/2023   PLT 349.0 06/24/2023   GLUCOSE 88 06/24/2023   CHOL 100 06/24/2023   TRIG 42.0 06/24/2023   HDL 49.70 06/24/2023   LDLCALC 42 06/24/2023   ALT 11 06/24/2023   AST 10 06/24/2023   NA 138 06/24/2023   K 4.2 06/24/2023   CL 104 06/24/2023   CREATININE 0.85 06/24/2023   BUN 11 06/24/2023   CO2 26 06/24/2023   TSH 1.29 06/24/2023   HGBA1C 5.1 12/29/2021    Lab Results  Component Value Date   TSH 1.29 06/24/2023   Lab Results  Component Value Date   WBC 8.8 06/24/2023   HGB 14.5 06/24/2023   HCT 43.6 06/24/2023   MCV 96.6 06/24/2023   PLT 349.0 06/24/2023   Lab Results  Component Value Date   NA 138 06/24/2023   K 4.2 06/24/2023   CO2 26 06/24/2023   GLUCOSE 88 06/24/2023   BUN 11 06/24/2023   CREATININE 0.85 06/24/2023   BILITOT 0.4 06/24/2023   ALKPHOS 52 06/24/2023   AST 10 06/24/2023   ALT 11 06/24/2023   PROT 6.2 06/24/2023   ALBUMIN 4.0 06/24/2023   CALCIUM 8.6 06/24/2023   ANIONGAP 7 01/20/2015   EGFR 72.0 06/17/2022   GFR 85.24 06/24/2023   Lab Results  Component Value Date   CHOL 100 06/24/2023   Lab Results  Component Value Date   HDL 49.70 06/24/2023   Lab Results  Component Value Date   LDLCALC 42 06/24/2023   Lab Results  Component Value Date   TRIG 42.0 06/24/2023   Lab Results  Component Value Date   CHOLHDL 2 06/24/2023   Lab Results  Component Value Date   HGBA1C 5.1 12/29/2021       Assessment & Plan:  Anxiety -     FLUoxetine HCl; Take 1 capsule (20 mg  total) by mouth daily.  Dispense: 90 capsule; Refill: 3  Acute vaginitis -     Fluconazole ; Take 1 tablet (150 mg total) by mouth as a single dose. May repeat in 3 days if needed.  Dispense: 2 tablet; Refill: 0 -     Cervicovaginal ancillary only  Snoring -     Ambulatory referral to Neurology  Attention deficit -     Amphetamine -Dextroamphetamine ; Take 1 tablet by mouth 2 (two) times daily.   Assessment and Plan Assessment & Plan Vulvovaginal candidiasis   Symptoms align with a yeast infection, such as itching. She recently tested positive for bacterial vaginosis and started on Metrogel , with no current BV symptoms. Perform a self-swab for yeast infection and prescribe Diflucan  if confirmed.  Anxiety disorder   Previously managed with Zoloft , which was discontinued due to jaw clenching. Prescribe Prozac 20 mg daily and schedule a follow-up in one month to assess response.  Attention-deficit hyperactivity disorder (ADHD)   ADHD is managed with Adderall , currently prescribed at 20 mg twice daily. Reinstate this prescription, as it was removed from the medication list, likely due to an administrative error.  Obstructive sleep apnea   Diagnosed 18 years ago with a sleep study. Symptoms suggest recurrence, such as waking up holding breath. Order a sleep evaluation with a neurologist. Consider waiting for the surgeon to order a sleep study if seen before the neurologist appointment, in the context of potential gastric bypass surgery.   Itali Mckendry R Lowne Chase, DO

## 2023-12-06 ENCOUNTER — Ambulatory Visit: Admitting: Gastroenterology

## 2023-12-06 ENCOUNTER — Encounter: Payer: Self-pay | Admitting: Gastroenterology

## 2023-12-06 VITALS — BP 118/78 | HR 94 | Ht 67.0 in | Wt 233.5 lb

## 2023-12-06 DIAGNOSIS — K219 Gastro-esophageal reflux disease without esophagitis: Secondary | ICD-10-CM | POA: Diagnosis not present

## 2023-12-06 DIAGNOSIS — R1110 Vomiting, unspecified: Secondary | ICD-10-CM | POA: Diagnosis not present

## 2023-12-06 DIAGNOSIS — Z9884 Bariatric surgery status: Secondary | ICD-10-CM

## 2023-12-06 DIAGNOSIS — R1310 Dysphagia, unspecified: Secondary | ICD-10-CM | POA: Diagnosis not present

## 2023-12-06 DIAGNOSIS — R1319 Other dysphagia: Secondary | ICD-10-CM

## 2023-12-06 NOTE — Patient Instructions (Signed)
 You have been scheduled for an endoscopy. Please follow written instructions given to you at your visit today.  If you use inhalers (even only as needed), please bring them with you on the day of your procedure.  If you take any of the following medications, they will need to be adjusted prior to your procedure:   DO NOT TAKE 7 DAYS PRIOR TO TEST- Trulicity (dulaglutide) Ozempic , Wegovy  (semaglutide ) Mounjaro (tirzepatide) Bydureon Bcise (exanatide extended release)  DO NOT TAKE 1 DAY PRIOR TO YOUR TEST Rybelsus  (semaglutide ) Adlyxin (lixisenatide) Victoza (liraglutide) Byetta (exanatide) ___________________________________________________________________________  Thank you for trusting me with your gastrointestinal care!   Camie Furbish, PA-C  _______________________________________________________  If your blood pressure at your visit was 140/90 or greater, please contact your primary care physician to follow up on this.  _______________________________________________________  If you are age 59 or older, your body mass index should be between 23-30. Your Body mass index is 36.57 kg/m. If this is out of the aforementioned range listed, please consider follow up with your Primary Care Provider.  If you are age 24 or younger, your body mass index should be between 19-25. Your Body mass index is 36.57 kg/m. If this is out of the aformentioned range listed, please consider follow up with your Primary Care Provider.   ________________________________________________________  The [facility] providers would like to encourage you to use MYCHART to communicate with providers for non-urgent requests or questions.  Due to long hold times on the telephone, sending your provider a message by [facility] may be a faster and more efficient way to get a response.  Please allow 48 business hours for a response.  Please remember that this is for non-urgent requests.   _______________________________________________________  [facility] is using a team-based approach to care.  Your team is made up of your doctor and two to three APPS. Our APPS (Nurse Practitioners and Physician Assistants) work with your physician to ensure care continuity for you. They are fully qualified to address your health concerns and develop a treatment plan. They communicate directly with your gastroenterologist to care for you. Seeing the Advanced Practice Practitioners on your physician's team can help you by facilitating care more promptly, often allowing for earlier appointments, access to diagnostic testing, procedures, and other specialty referrals.

## 2023-12-06 NOTE — Progress Notes (Signed)
 Marisa Humphrey [id number] [birth year]   Chief Complaint: GERD, trouble swallowing, weight gain  Referring Provider: Stechschulte, Deward PARAS, MD Primary GI MD: Dr. Shila  HPI: Marisa Humphrey is a 43 y.o. female with past medical history of ADD, anxiety/depression, GERD, OSA, prior laparoscopic gastric banding 2007 who presents today for evaluation of GERD, dysphagia, and weight gain.    Referred by [facility] for morbid obesity and GERD.  History of Lap-Band surgery several years ago and since then has had persistent regurgitation and food feeling stuck in her chest.  No recent upper endoscopy.  Upper GI series with small bowel follow-through was ordered and referred to GI for further evaluation of obesity and GERD. There is consideration to discuss potential conversion to gastric bypass if regurgitation confirmed to be due to the Lap-Band.  Labs 06/24/2023: Normal CMP, lipid panel, CBC  Upper GI series 09/09/2023 showed prior gastric band surgery with Phi angle of 25 degrees (within normal limits), small volume GERD and otherwise unremarkable.   Patient states she had laparoscopic banding almost 20 years ago.  Initially after surgery was having a lot of vomiting and losing significant amount of weight, though felt she had improved energy.  Became pregnant with her first child and states that she stayed restricted during this pregnancy.  After delivery had a plateau regarding weight loss.  Had fluid removed while nursing and was then not restricted with her second child.  Started gaining weight back.  She has had ongoing issues following this surgery including frequent regurgitation which she describes as vomiting, though she is not nauseated.  Will have a significant amount of regurgitated stomach contents come up at random.  Usually occurs while eating though not with every meal.  No clear dietary triggers.  When this occurs she has to go to the bathroom to  vomit or pull over on the side of the road while driving.  Often feels that food is not going down.  Meats are particularly difficult.  Overall symptoms are worse when she is restricted by the lap band but symptoms remain regardless.  Has heartburn 1-2 times a week which is relieved with Pepto-Bismol more than Tums.  Denies being on any antireflux medications in the past.  Denies fever, chills, abdominal pain.  She has regular bowel movements daily and denies any issues with diarrhea, constipation, blood in her stool, or melena.  She denies prior colonoscopy.  Does not think that she has had an upper endoscopy either.  She has been on Wegovy  in the past and lost 50 pounds, but had to discontinue as it was no longer covered by insurance.  Has a hard time losing weight despite the presence of laparoscopic band and has had ongoing issues since it was placed.  She is hopeful to have possible gastric bypass.  Previous GI Procedures/Imaging   UGI series 09/09/2023 1. Prior gastric band surgery. Phi angle of 25 degrees (within normal limits). 2. Small-volume spontaneous gastroesophageal reflux observed to the level of the lower esophagus. 3. Otherwise unremarkable upper GI series, as described.  Past Medical History:  Diagnosis Date   ADD (attention deficit disorder)    Anxiety    Depressed    Depression    Edema of both lower legs    Foot pain    GERD (gastroesophageal reflux disease)    Headache    History of condyloma acuminatum    Joint pain    Knee pain    Sleep  apnea     Past Surgical History:  Procedure Laterality Date   EXCISION BONE CYST Left 06/05/2019   Procedure: EXCISION BONE CYST;  Surgeon: Murrell Drivers, MD;  Location: [facility];  Service: Orthopedics;  Laterality: Left;   LAPAROSCOPIC GASTRIC BANDING  11/09/2005   Dr.Martin   MOLE REMOVAL     OPEN REDUCTION INTERNAL FIXATION (ORIF) FINGER WITH RADIAL BONE GRAFT Left 06/05/2019   Procedure: OPEN REDUCTION PIN  FIXATION, CURRETAGE BONE LESION WITH CANCELLOUS CHIP BONE GRAFTING;  Surgeon: Murrell Drivers, MD;  Location: [facility];  Service: Orthopedics;  Laterality: Left;    Current Outpatient Medications  Medication Sig Dispense Refill   acetaminophen  (TYLENOL ) 500 MG tablet Take 1,000 mg by mouth every 8 (eight) hours as needed.     ALPRAZolam  (XANAX ) 0.25 MG tablet Take 1 tablet (0.25 mg total) by mouth 2 (two) times daily as needed for anxiety. 20 tablet 0   amphetamine -dextroamphetamine  (ADDERALL ) 30 MG tablet Take 1 tablet by mouth 2 (two) times daily.     fluconazole  (DIFLUCAN ) 150 MG tablet Take 1 tablet (150 mg total) by mouth as a single dose. May repeat in 3 days if needed. 2 tablet 0   FLUoxetine (PROZAC) 20 MG capsule Take 1 capsule (20 mg total) by mouth daily. 90 capsule 3   folic acid  (FOLVITE ) 1 MG tablet Take 1 tablet (1 mg total) by mouth daily. 90 tablet 3   ibuprofen  (ADVIL ) 800 MG tablet Take 800 mg by mouth every 8 (eight) hours as needed.     levonorgestrel (MIRENA) 20 MCG/24HR IUD 1 each by Intrauterine route once.     TUBERCULIN SYR 1CC/27GX1/2 (B-D TB SYRINGE 1CC/27GX1/2) 27G X 1/2 1 ML MISC use as directed to inject B-12 50 each 0   Vitamin D , Ergocalciferol , (DRISDOL ) 1.25 MG (50000 UNIT) CAPS capsule Take 1 capsule (50,000 Units total) by mouth every 7 (seven) days. 12 capsule 1   No current facility-administered medications for this visit.    Allergies as of 12/06/2023 - Review Complete 12/05/2023  Allergen Reaction Noted   Penicillins Rash     Family History  Problem Relation Age of Onset   Diabetes Mother    Hypertension Mother    Hyperlipidemia Mother    Depression Mother    Anxiety disorder Mother    Bipolar disorder Mother    Alcoholism Mother    Heart attack Father 50   Heart disease Father 86       MI   High Cholesterol Father    Sudden death Father    Alcoholism Father    Multiple sclerosis Brother    Diabetes Maternal Grandmother     Leukemia Maternal Grandmother    Breast cancer Paternal Aunt    Cancer Other        breast    Social History   Tobacco Use   Smoking status: Some Days    Types: Cigarettes   Smokeless tobacco: Never  Vaping Use   Vaping status: Never Used  Substance Use Topics   Alcohol use: Yes    Comment: occ   Drug use: No     Review of Systems:    Constitutional: No unintentional weight loss, fever, chills Cardiovascular: No chest pain Respiratory: No SOB Gastrointestinal: See HPI and otherwise negative Hematologic: No bleeding or bruising   Physical Exam:  Vital signs: BP 118/78   Pulse 94   Ht 5' 7 (1.702 m)   Wt 233 lb 8 oz (  105.9 kg)   SpO2 99%   BMI 36.57 kg/m   Constitutional: Pleasant, obese female in NAD, alert and cooperative Head:  Normocephalic and atraumatic.  Eyes: No scleral icterus.  Respiratory: Respirations even and unlabored. Lungs clear to auscultation bilaterally.  No wheezes, crackles, or rhonchi.  Cardiovascular:  Regular rate and rhythm. No murmurs. No peripheral edema. Gastrointestinal:  Soft, nondistended, nontender. No rebound or guarding. Normal bowel sounds. No appreciable masses or hepatomegaly. Rectal:  Not performed.  Neurologic:  Alert and oriented x4;  grossly normal neurologically.  Skin:   Dry and intact without significant lesions or rashes. Psychiatric: Oriented to person, place and time. Demonstrates good judgement and reason without abnormal affect or behaviors.   RELEVANT LABS AND IMAGING: CBC    Component Value Date/Time   WBC 8.8 06/24/2023 1351   RBC 4.52 06/24/2023 1351   HGB 14.5 06/24/2023 1351   HGB 15.2 03/20/2020 1105   HCT 43.6 06/24/2023 1351   HCT 46.2 03/20/2020 1105   PLT 349.0 06/24/2023 1351   PLT 354 03/20/2020 1105   MCV 96.6 06/24/2023 1351   MCV 92 03/20/2020 1105   MCH 30.4 03/20/2020 1105   MCH 30.5 11/29/2019 1535   MCHC 33.2 06/24/2023 1351   RDW 13.1 06/24/2023 1351   RDW 11.8 03/20/2020 1105    LYMPHSABS 1.7 06/24/2023 1351   LYMPHSABS 2.0 03/20/2020 1105   MONOABS 0.5 06/24/2023 1351   EOSABS 0.1 06/24/2023 1351   EOSABS 0.1 03/20/2020 1105   BASOSABS 0.0 06/24/2023 1351   BASOSABS 0.1 03/20/2020 1105    CMP     Component Value Date/Time   NA 138 06/24/2023 1351   NA 139 03/20/2020 1105   K 4.2 06/24/2023 1351   CL 104 06/24/2023 1351   CO2 26 06/24/2023 1351   GLUCOSE 88 06/24/2023 1351   GLUCOSE 94 02/17/2006 1116   BUN 11 06/24/2023 1351   BUN 9 03/20/2020 1105   CREATININE 0.85 06/24/2023 1351   CREATININE 0.94 11/29/2019 1535   CALCIUM 8.6 06/24/2023 1351   PROT 6.2 06/24/2023 1351   PROT 6.8 03/20/2020 1105   ALBUMIN 4.0 06/24/2023 1351   ALBUMIN 4.5 03/20/2020 1105   AST 10 06/24/2023 1351   ALT 11 06/24/2023 1351   ALKPHOS 52 06/24/2023 1351   BILITOT 0.4 06/24/2023 1351   BILITOT 0.3 03/20/2020 1105   GFRNONAA 85 03/20/2020 1105   GFRAA 98 03/20/2020 1105     Assessment/Plan:   GERD Esophageal dysphagia Regurgitation of stomach contents History of gastric banding Obesity Patient seen today having been referred by [facility] for morbid obesity and GERD.  Has history of laparoscopic banding almost 20 years ago and has had intermittent issues with regurgitation and trouble swallowing since.  Currently has episodes of vomiting about 4 times a week, often while eating.  Will have significant regurgitation of stomach contents.  Often feels like food does not go down and gets stuck in her chest.  Worse with meats but otherwise no clear dietary triggers. Upper GI series 09/09/2023 showed small volume GERD.  She is currently having symptoms of heartburn 1-2 times a week and is using Pepto-Bismol and Tums as needed for this. No prior EGD.  - Schedule EGD for further evaluation of symptoms, with possible dilation. I thoroughly discussed the procedure with the patient to include nature of the procedure, alternatives, benefits, and risks (including but not limited  to bleeding, infection, perforation, anesthesia/cardiac/pulmonary complications). Patient verbalized understanding and gave verbal consent to proceed  with procedure.  - If no findings to explain dysphagia on EGD consider follow up with surgery to discuss revision of lap band   Camie Furbish, PA-C [facility] 12/06/2023, 9:54 AM  Patient Care Team: Antonio Meth, Jamee SAUNDERS, DO as PCP - General Kandyce Sor, MD as Consulting Physician (Obstetrics and Gynecology)

## 2023-12-07 LAB — CERVICOVAGINAL ANCILLARY ONLY
Bacterial Vaginitis (gardnerella): NEGATIVE
Candida Glabrata: NEGATIVE
Candida Vaginitis: POSITIVE — AB
Chlamydia: NEGATIVE
Comment: NEGATIVE
Comment: NEGATIVE
Comment: NEGATIVE
Comment: NEGATIVE
Comment: NEGATIVE
Comment: NORMAL
Neisseria Gonorrhea: NEGATIVE
Trichomonas: NEGATIVE

## 2023-12-08 ENCOUNTER — Other Ambulatory Visit: Payer: Self-pay | Admitting: Family Medicine

## 2023-12-08 ENCOUNTER — Encounter: Payer: Self-pay | Admitting: Family Medicine

## 2023-12-08 ENCOUNTER — Other Ambulatory Visit (HOSPITAL_COMMUNITY): Payer: Self-pay

## 2023-12-08 ENCOUNTER — Ambulatory Visit: Payer: Self-pay | Admitting: Family Medicine

## 2023-12-08 DIAGNOSIS — R41840 Attention and concentration deficit: Secondary | ICD-10-CM

## 2023-12-08 MED ORDER — AMPHETAMINE-DEXTROAMPHETAMINE 30 MG PO TABS
30.0000 mg | ORAL_TABLET | Freq: Two times a day (BID) | ORAL | 0 refills | Status: DC
  Filled 2023-12-08: qty 30, 15d supply, fill #0

## 2023-12-08 NOTE — Telephone Encounter (Signed)
 Requesting: Adderall  Contract: 06/24/2023 UDS: 06/24/2023 Last OV: 12/05/2023 Next OV: 01/05/2024 Last Refill: 12/05/2023 Database:   Please advise

## 2023-12-16 ENCOUNTER — Other Ambulatory Visit (HOSPITAL_COMMUNITY): Payer: Self-pay

## 2023-12-16 ENCOUNTER — Encounter (HOSPITAL_COMMUNITY): Payer: Self-pay

## 2023-12-19 ENCOUNTER — Encounter: Payer: Self-pay | Admitting: Family Medicine

## 2023-12-19 ENCOUNTER — Other Ambulatory Visit (HOSPITAL_COMMUNITY): Payer: Self-pay

## 2023-12-19 ENCOUNTER — Other Ambulatory Visit: Payer: Self-pay | Admitting: Family Medicine

## 2023-12-19 ENCOUNTER — Other Ambulatory Visit: Payer: Self-pay

## 2023-12-19 DIAGNOSIS — R41840 Attention and concentration deficit: Secondary | ICD-10-CM

## 2023-12-19 DIAGNOSIS — F419 Anxiety disorder, unspecified: Secondary | ICD-10-CM

## 2023-12-19 MED ORDER — AMPHETAMINE-DEXTROAMPHETAMINE 30 MG PO TABS
30.0000 mg | ORAL_TABLET | Freq: Two times a day (BID) | ORAL | 0 refills | Status: DC
  Filled 2023-12-19 – 2023-12-22 (×2): qty 60, 30d supply, fill #0
  Filled ????-??-??: fill #0

## 2023-12-19 MED ORDER — ALPRAZOLAM 0.25 MG PO TABS
0.2500 mg | ORAL_TABLET | Freq: Two times a day (BID) | ORAL | 0 refills | Status: DC | PRN
  Filled 2023-12-19: qty 20, 10d supply, fill #0

## 2023-12-19 NOTE — Telephone Encounter (Signed)
 Requesting: alprazolam  0.25mg   Contract: 06/24/23 UDS: 06/24/23 Last Visit: 12/05/23 Next Visit: 01/05/24 Last Refill: 11/24/23 #20 and 0RF   Please Advise

## 2023-12-22 ENCOUNTER — Other Ambulatory Visit (HOSPITAL_COMMUNITY): Payer: Self-pay

## 2023-12-27 ENCOUNTER — Encounter: Payer: Self-pay | Admitting: Gastroenterology

## 2023-12-28 ENCOUNTER — Other Ambulatory Visit (HOSPITAL_COMMUNITY): Payer: Self-pay

## 2023-12-28 MED ORDER — COVID-19 MRNA VAC-TRIS(PFIZER) 30 MCG/0.3ML IM SUSY
0.3000 mL | PREFILLED_SYRINGE | Freq: Once | INTRAMUSCULAR | 0 refills | Status: AC
  Filled 2023-12-28: qty 0.3, 1d supply, fill #0

## 2023-12-28 MED ORDER — FLUZONE 0.5 ML IM SUSY
0.5000 mL | PREFILLED_SYRINGE | Freq: Once | INTRAMUSCULAR | 0 refills | Status: AC
  Filled 2023-12-28: qty 0.5, 1d supply, fill #0

## 2024-01-03 ENCOUNTER — Encounter: Payer: Self-pay | Admitting: Gastroenterology

## 2024-01-03 ENCOUNTER — Other Ambulatory Visit (HOSPITAL_COMMUNITY): Payer: Self-pay

## 2024-01-03 ENCOUNTER — Ambulatory Visit: Admitting: Gastroenterology

## 2024-01-03 VITALS — BP 160/99 | HR 70 | Temp 98.4°F | Resp 12 | Ht 67.0 in | Wt 233.0 lb

## 2024-01-03 DIAGNOSIS — K2970 Gastritis, unspecified, without bleeding: Secondary | ICD-10-CM

## 2024-01-03 DIAGNOSIS — G4730 Sleep apnea, unspecified: Secondary | ICD-10-CM | POA: Diagnosis not present

## 2024-01-03 DIAGNOSIS — F32A Depression, unspecified: Secondary | ICD-10-CM | POA: Diagnosis not present

## 2024-01-03 DIAGNOSIS — K2990 Gastroduodenitis, unspecified, without bleeding: Secondary | ICD-10-CM | POA: Diagnosis not present

## 2024-01-03 DIAGNOSIS — F419 Anxiety disorder, unspecified: Secondary | ICD-10-CM | POA: Diagnosis not present

## 2024-01-03 DIAGNOSIS — K3189 Other diseases of stomach and duodenum: Secondary | ICD-10-CM

## 2024-01-03 DIAGNOSIS — K2100 Gastro-esophageal reflux disease with esophagitis, without bleeding: Secondary | ICD-10-CM | POA: Diagnosis not present

## 2024-01-03 DIAGNOSIS — K219 Gastro-esophageal reflux disease without esophagitis: Secondary | ICD-10-CM

## 2024-01-03 DIAGNOSIS — F909 Attention-deficit hyperactivity disorder, unspecified type: Secondary | ICD-10-CM | POA: Diagnosis not present

## 2024-01-03 DIAGNOSIS — R1310 Dysphagia, unspecified: Secondary | ICD-10-CM | POA: Diagnosis not present

## 2024-01-03 DIAGNOSIS — K319 Disease of stomach and duodenum, unspecified: Secondary | ICD-10-CM | POA: Diagnosis not present

## 2024-01-03 DIAGNOSIS — Z9884 Bariatric surgery status: Secondary | ICD-10-CM

## 2024-01-03 MED ORDER — SODIUM CHLORIDE 0.9 % IV SOLN: 500.0000 mL | Freq: Once | INTRAVENOUS | Status: DC

## 2024-01-03 MED ORDER — PANTOPRAZOLE SODIUM 40 MG PO TBEC
40.0000 mg | DELAYED_RELEASE_TABLET | Freq: Two times a day (BID) | ORAL | 3 refills | Status: AC
  Filled 2024-01-03: qty 90, 45d supply, fill #0

## 2024-01-03 NOTE — Progress Notes (Signed)
 Pt's states no medical or surgical changes since previsit or office visit.

## 2024-01-03 NOTE — Progress Notes (Unsigned)
 Called to room to assist during endoscopic procedure.  Patient ID and intended procedure confirmed with present staff. Received instructions for my participation in the procedure from the performing physician.

## 2024-01-03 NOTE — Progress Notes (Unsigned)
 A/o x 3, VSS, good SR's, pleased with anesthesia, report to RN

## 2024-01-03 NOTE — Progress Notes (Unsigned)
 [facility] History and Physical   Primary Care Physician:  Antonio Meth, Jamee SAUNDERS, DO   Reason for Procedure:  Dysphagia, h/o gastric lap band  Plan:    EGD  with possible interventions as needed     HPI: Marisa Humphrey is a very pleasant 43 y.o. female here for EGD for Dysphagia, h/o gastric lap band.   The risks and benefits as well as alternatives of endoscopic procedure(s) have been discussed and reviewed.  The patient was provided an opportunity to ask questions and all were answered. The patient agreed with the plan and demonstrated an understanding of the instructions.   Past Medical History:  Diagnosis Date   ADD (attention deficit disorder)    Anxiety    Depressed    Depression    Edema of both lower legs    Foot pain    GERD (gastroesophageal reflux disease)    Headache    History of condyloma acuminatum    Joint pain    Knee pain    Sleep apnea     Past Surgical History:  Procedure Laterality Date   EXCISION BONE CYST Left 06/05/2019   Procedure: EXCISION BONE CYST;  Surgeon: Murrell Drivers, MD;  Location: [facility];  Service: Orthopedics;  Laterality: Left;   LAPAROSCOPIC GASTRIC BANDING  11/09/2005   Dr.Martin   MOLE REMOVAL     OPEN REDUCTION INTERNAL FIXATION (ORIF) FINGER WITH RADIAL BONE GRAFT Left 06/05/2019   Procedure: OPEN REDUCTION PIN FIXATION, CURRETAGE BONE LESION WITH CANCELLOUS CHIP BONE GRAFTING;  Surgeon: Murrell Drivers, MD;  Location:  SUR[facility]ERY [facility];  Service: Orthopedics;  Laterality: Left;    Prior to Admission medications   Medication Sig Start Date End Date Taking? Authorizing Provider  ALPRAZolam  (XANAX ) 0.25 MG tablet Take 1 tablet (0.25 mg total) by mouth 2 (two) times daily as needed for anxiety. 12/19/23  Yes Antonio Meth Jamee R, DO  amphetamine -dextroamphetamine  (ADDERALL ) 30 MG tablet Take 1 tablet by mouth 2 (two) times daily. 12/19/23  Yes Antonio Meth Jamee R, DO  FLUoxetine  (PROZAC) 20 MG capsule Take 1 capsule (20 mg total) by mouth daily. 12/05/23  Yes Antonio Meth Jamee R, DO  acetaminophen  (TYLENOL ) 500 MG tablet Take 1,000 mg by mouth every 8 (eight) hours as needed.    [provider]  fluconazole  (DIFLUCAN ) 150 MG tablet Take 1 tablet (150 mg total) by mouth as a single dose. May repeat in 3 days if needed. Patient not taking: Reported on 01/03/2024 12/05/23   Antonio Meth Jamee R, DO  folic acid  (FOLVITE ) 1 MG tablet Take 1 tablet (1 mg total) by mouth daily. 07/07/23     ibuprofen  (ADVIL ) 800 MG tablet Take 800 mg by mouth every 8 (eight) hours as needed.    [provider]  levonorgestrel (MIRENA) 20 MCG/24HR IUD 1 each by Intrauterine route once.    [provider]  TUBERCULIN SYR 1CC/27GX1/2 (B-D TB SYRINGE 1CC/27GX1/2) 27G X 1/2 1 ML MISC use as directed to inject B-12 01/10/23   Lowne Chase, Yvonne R, DO  Vitamin D , Ergocalciferol , (DRISDOL ) 1.25 MG (50000 UNIT) CAPS capsule Take 1 capsule (50,000 Units total) by mouth every 7 (seven) days. 01/01/22   Lowne Chase, Yvonne R, DO    Current Outpatient Medications  Medication Sig Dispense Refill   ALPRAZolam  (XANAX ) 0.25 MG tablet Take 1 tablet (0.25 mg total) by mouth 2 (two) times daily as needed for anxiety. 20 tablet 0  amphetamine -dextroamphetamine  (ADDERALL ) 30 MG tablet Take 1 tablet by mouth 2 (two) times daily. 60 tablet 0   FLUoxetine (PROZAC) 20 MG capsule Take 1 capsule (20 mg total) by mouth daily. 90 capsule 3   acetaminophen  (TYLENOL ) 500 MG tablet Take 1,000 mg by mouth every 8 (eight) hours as needed.     fluconazole  (DIFLUCAN ) 150 MG tablet Take 1 tablet (150 mg total) by mouth as a single dose. May repeat in 3 days if needed. (Patient not taking: Reported on 01/03/2024) 2 tablet 0   folic acid  (FOLVITE ) 1 MG tablet Take 1 tablet (1 mg total) by mouth daily. 90 tablet 3   ibuprofen  (ADVIL ) 800 MG tablet Take 800 mg by mouth every 8 (eight) hours as needed.      levonorgestrel (MIRENA) 20 MCG/24HR IUD 1 each by Intrauterine route once.     TUBERCULIN SYR 1CC/27GX1/2 (B-D TB SYRINGE 1CC/27GX1/2) 27G X 1/2 1 ML MISC use as directed to inject B-12 50 each 0   Vitamin D , Ergocalciferol , (DRISDOL ) 1.25 MG (50000 UNIT) CAPS capsule Take 1 capsule (50,000 Units total) by mouth every 7 (seven) days. 12 capsule 1   Current Facility-Administered Medications  Medication Dose Route Frequency Provider Last Rate Last Admin   0.9 %  sodium chloride infusion  500 mL Intravenous Once Rawley Harju V, MD        Allergies as of 01/03/2024 - Review Complete 01/03/2024  Allergen Reaction Noted   Penicillins Rash     Family History  Problem Relation Age of Onset   Diabetes Mother    Hypertension Mother    Hyperlipidemia Mother    Depression Mother    Anxiety disorder Mother    Bipolar disorder Mother    Alcoholism Mother    Heart attack Father 29   Heart disease Father 27       MI   High Cholesterol Father    Sudden death Father    Alcoholism Father    Multiple sclerosis Brother    Diabetes Maternal Grandmother    Leukemia Maternal Grandmother    Breast cancer Paternal Aunt    Cancer Other        breast    Social History   Socioeconomic History   Marital status: Legally Separated    Spouse name: Medford   Number of children: 2   Years of education: Not on file   Highest education level: Not on file  Occupational History   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  Other Topics Concern   Not on file  Social History Narrative   Exercising --no   Social Drivers of Research Scientist (physical Sciences) Strain: Not on file  Food Insecurity: Not on file  Transportation Needs: Not on file  Physical Activity: Not on file  Stress: Not on file  Social Connections: Not on file  Intimate Partner Violence: Not on file    Review of Systems:  All other review of systems negative except as mentioned in the HPI.  Physical Exam: Vital signs in last 24 hours:  BP (!) 161/98   Pulse 65   Temp 98.4 F (36.9 C)   Resp 18   Ht 5' 7 (1.702 m)   Wt 233 lb (105.7 kg)   SpO2 96%   BMI 36.49 kg/m  General:   Alert, NAD Lungs:  Clear .   Heart:  Regular rate and rhythm Abdomen:  Soft, nontender and nondistended. Neuro/Psych:  Alert and cooperative. Normal mood and affect. A and O x 3  Reviewed labs, radiology imaging, old records and pertinent past GI work up  Patient is appropriate for planned procedure(s) and anesthesia in an ambulatory setting   K. Veena Tamantha Saline , MD [phone]

## 2024-01-03 NOTE — Op Note (Signed)
 [facility] Patient Name: Marisa Humphrey Procedure Date: 01/03/2024 3:50 PM MRN: [id number] Endoscopist: Gustav ALONSO Mcgee , MD, [id number] Age: 43 Referring MD:  Date of Birth: August 08, 1980 Gender: Female Account #: [id number] Procedure:                Upper GI endoscopy Indications:              Dysphagia, history of gastric lap band Medicines:                Monitored Anesthesia Care Procedure:                Pre-Anesthesia Assessment:                           - Prior to the procedure, a History and Physical                            was performed, and patient medications and                            allergies were reviewed. The patient's tolerance of                            previous anesthesia was also reviewed. The risks                            and benefits of the procedure and the sedation                            options and risks were discussed with the patient.                            All questions were answered, and informed consent                            was obtained. Prior Anticoagulants: The patient has                            taken no anticoagulant or antiplatelet agents. ASA                            Grade Assessment: II - A patient with mild systemic                            disease. After reviewing the risks and benefits,                            the patient was deemed in satisfactory condition to                            undergo the procedure.                           After obtaining informed consent, the endoscope was  passed under direct vision. Throughout the                            procedure, the patient's blood pressure, pulse, and                            oxygen saturations were monitored continuously. The                            GIF [id number] #[id number] was introduced through the                            mouth, and advanced to the second part of duodenum.                             The upper GI endoscopy was accomplished without                            difficulty. The patient tolerated the procedure                            well. Scope In: Scope Out: Findings:                 The Z-line was regular and was found 36 cm from the                            incisors.                           LA Grade C (one or more mucosal breaks continuous                            between tops of 2 or more mucosal folds, less than                            75% circumference) esophagitis with no bleeding was                            found 34 to 36 cm from the incisors.                           Few linear and superficial esophageal ulcers with                            no bleeding and no stigmata of recent bleeding were                            found 34 to 36 cm from the incisors. The largest                            lesion was 4 mm in largest dimension.  Patchy mild inflammation characterized by                            congestion (edema), erythema and friability was                            found in the entire examined stomach. Biopsies were                            taken with a cold forceps for Helicobacter pylori                            testing.                           Evidence of a banded gastroplasty was found. A                            gastric pouch with a large size was found. Evidence                            of a Silastic band was seen and appeared loose,                            possible dislocation to mid gastric body/antrum?.                            This was traversed.                           Patchy mildly erythematous mucosa without active                            bleeding and with no stigmata of bleeding was found                            in the duodenal bulb.                           The second portion of the duodenum was normal. Complications:            No immediate complications. Estimated Blood  Loss:     Estimated blood loss was minimal. Impression:               - Z-line regular, 36 cm from the incisors.                           - LA Grade C reflux esophagitis with no bleeding.                           - Esophageal ulcers with no bleeding and no                            stigmata of recent bleeding.                           -  Gastritis. Biopsied.                           - Possible slipped banded gastroplasty with a                            large-sized pouch and band appears loose, slipped                            to mid gastric body.                           - Erythematous duodenopathy.                           - Normal second portion of the duodenum. Recommendation:           - Resume previous diet.                           - Continue present medications.                           - Await pathology results.                           - Follow an antireflux regimen.                           - Use Protonix (pantoprazole) 40 mg PO BID. Rx for                            90 days with 3 refills                           - CT Chest with oral contrast                           - Follow up with surgery for revision of gastric                            lap band Gustav ALONSO Mcgee, MD 01/03/2024 4:41:16 PM This report has been signed electronically.

## 2024-01-03 NOTE — Patient Instructions (Addendum)
 -Handout on antireflux and gastritis provided. -await pathology results. -Continue present medications. -Office will call to schedule CT can  YOU HAD AN ENDOSCOPIC PROCEDURE TODAY AT THE [facility]:   Refer to the procedure report that was given to you for any specific questions about what was found during the examination.  If the procedure report does not answer your questions, please call your gastroenterologist to clarify.  If you requested that your care partner not be given the details of your procedure findings, then the procedure report has been included in a sealed envelope for you to review at your convenience later.  YOU SHOULD EXPECT: Some feelings of bloating in the abdomen. Passage of more gas than usual.  Walking can help get rid of the air that was put into your GI tract during the procedure and reduce the bloating. If you had a lower endoscopy (such as a colonoscopy or flexible sigmoidoscopy) you may notice spotting of blood in your stool or on the toilet paper. If you underwent a bowel prep for your procedure, you may not have a normal bowel movement for a few days.  Please Note:  You might notice some irritation and congestion in your nose or some drainage.  This is from the oxygen used during your procedure.  There is no need for concern and it should clear up in a day or so.  SYMPTOMS TO REPORT IMMEDIATELY:  Following upper endoscopy (EGD)  Vomiting of blood or coffee ground material  New chest pain or pain under the shoulder blades  Painful or persistently difficult swallowing  New shortness of breath  Fever of 100F or higher  Black, tarry-looking stools  For urgent or emergent issues, a gastroenterologist can be reached at any hour by calling [phone]. Do not use MyChart messaging for urgent concerns.    DIET:  We do recommend a small meal at first, but then you may proceed to your regular diet.  Drink plenty of fluids but you should avoid alcoholic  beverages for 24 hours.  ACTIVITY:  You should plan to take it easy for the rest of today and you should NOT DRIVE or use heavy machinery until tomorrow (because of the sedation medicines used during the test).    FOLLOW UP: Our staff will call the number listed on your records the next business day following your procedure.  We will call around 7:15- 8:00 am to check on you and address any questions or concerns that you may have regarding the information given to you following your procedure. If we do not reach you, we will leave a message.     If any biopsies were taken you will be contacted by phone or by letter within the next 1-3 weeks.  Please call us  at [phone] if you have not heard about the biopsies in 3 weeks.    SIGNATURES/CONFIDENTIALITY: You and/or your care partner have signed paperwork which will be entered into your electronic medical record.  These signatures attest to the fact that that the information above on your After Visit Summary has been reviewed and is understood.  Full responsibility of the confidentiality of this discharge information lies with you and/or your care-partner.     Inflammation of the Stomach in Adults (Gastritis): What to Know Gastritis is when the lining of your stomach gets red and swollen (inflammation). There're two kinds of gastritis. There's gastritis that happens quickly. This is called acute gastritis. There's gastritis that happens over a long time.  This is called chronic gastritis. Gastritis must be treated. If not, you can have sores or bleeding in your stomach. What are the causes? Germs that get to your stomach and cause an infection. Drinking too much alcohol. Taking some medicines. Too much acid in the stomach. Disease of the stomach. Allergies. Cancer treatments (radiation). Smoking cigarettes or using products that contain nicotine or tobacco. What increases the risk? Having a disease of the intestines. Having an  autoimmune disease, such as Crohn's disease. This is when your immune system attacks other organs in the body. Using aspirin, ibuprofen , and other NSAIDs to treat other conditions. Stress. What are the signs or symptoms? Pain or burning in your belly. Feeling like you may throw up. Throwing up. Feeling too full after you eat. Losing weight. Bad breath. Blood in your throw-up or poop. In some cases, there are no symptoms. How is this treated? Gastritis is treated with medicines. The medicines that are used depend on what caused the condition. If the condition was caused by germs (bacteria), you'll be given antibiotics. If the condition was caused by too much acid in the stomach, you'll be given antacids, H2 blockers, or proton pump inhibitors. You may also be told to stop taking certain medicines, such as aspirin, ibuprofen , and other NSAIDs. Follow these instructions at home: Medicines Take your medicines only as told. Take your antibiotics as told. Do not stop taking them even if you start to feel better. Alcohol use Do not drink alcohol if: Your doctor tells you not to drink. You are pregnant, may be pregnant, or are planning to become pregnant. If you drink alcohol: Limit how much you have to: 0-1 drink a day if you're female. 0-2 drinks a day if you're female. Know how much alcohol is in your drink. In the [place] one drink is one 12 oz bottle of beer (355 mL), one 5 oz glass of wine (148 mL), or one 1 oz glass of hard liquor (44 mL). General instructions Eat small meals often, instead of large meals. Avoid foods and drinks that make your symptoms worse. Drink more fluid as told. Do not smoke, vape, or use nicotine or tobacco. Talk with your doctor about ways to manage stress. You may be told to: Get regular exercise. Do deep breathing. Do meditation or yoga. Contact a doctor if: Your symptoms get worse. The pain in your belly gets worse. Your symptoms go away and then come  back. You have a fever. Get help right away if: You throw up blood or a substance that looks like coffee grounds. You have black or dark red poop. You throw up every time you drink something. These symptoms may be an emergency. Call 911 right away. Do not wait to see if the symptoms will go away. Do not drive yourself to the hospital. This information is not intended to replace advice given to you by your health care provider. Make sure you discuss any questions you have with your health care provider. Document Revised: 04/19/2023 Document Reviewed: 04/19/2023 Elsevier Patient Education  2025 [employer].

## 2024-01-04 ENCOUNTER — Other Ambulatory Visit: Payer: Self-pay | Admitting: *Deleted

## 2024-01-04 ENCOUNTER — Telehealth: Payer: Self-pay

## 2024-01-04 DIAGNOSIS — K219 Gastro-esophageal reflux disease without esophagitis: Secondary | ICD-10-CM

## 2024-01-04 DIAGNOSIS — R1319 Other dysphagia: Secondary | ICD-10-CM

## 2024-01-04 DIAGNOSIS — Z9884 Bariatric surgery status: Secondary | ICD-10-CM

## 2024-01-04 DIAGNOSIS — R1110 Vomiting, unspecified: Secondary | ICD-10-CM

## 2024-01-04 NOTE — Telephone Encounter (Signed)
  Follow up Call-     01/03/2024    3:11 PM  Call back number  Post procedure Call Back phone  # [phone]  Permission to leave phone message Yes     Patient questions:  Do you have a fever, pain , or abdominal swelling? No. Pain Score  0 *  Have you tolerated food without any problems? Yes.    Have you been able to return to your normal activities? Yes.    Do you have any questions about your discharge instructions: Diet   No. Medications  No. Follow up visit  No.  Do you have questions or concerns about your Care? No.  Actions: * If pain score is 4 or above: No action needed, pain <4.

## 2024-01-05 ENCOUNTER — Other Ambulatory Visit (HOSPITAL_COMMUNITY): Payer: Self-pay

## 2024-01-05 ENCOUNTER — Telehealth (INDEPENDENT_AMBULATORY_CARE_PROVIDER_SITE_OTHER): Admitting: Family Medicine

## 2024-01-05 DIAGNOSIS — F419 Anxiety disorder, unspecified: Secondary | ICD-10-CM

## 2024-01-05 MED ORDER — FLUOXETINE HCL 40 MG PO CAPS
40.0000 mg | ORAL_CAPSULE | Freq: Every day | ORAL | 3 refills | Status: AC
  Filled 2024-01-05: qty 90, 90d supply, fill #0

## 2024-01-05 NOTE — Progress Notes (Signed)
 MyChart Video Visit    Virtual Visit via Video Note   This patient is at least at moderate risk for complications without adequate follow up. This format is felt to be most appropriate for this patient at this time. Physical exam was limited by quality of the video and audio technology used for the visit. [facility] was able to get the patient set up on a video visit.  Patient location: work in office,  Patient and provider in visit Provider location: Office  I discussed the limitations of evaluation and management by telemedicine and the availability of in person appointments. The patient expressed understanding and agreed to proceed.  Visit Date: 01/05/2024  Today's healthcare provider: [name] [name], DO     Subjective:    Patient ID: Marisa Humphrey, female    DOB: 09-08-1980, 43 y.o.   MRN: [id number]  Chief Complaint  Patient presents with   Anxiety   Follow-up    HPI Patient is in today for f/u anxiety.  Discussed the use of AI scribe software for clinical note transcription with the patient, who gave verbal consent to proceed.  History of Present Illness Marisa Humphrey is a 43 year old female who presents with lack of improvement in symptoms despite medication use.  She has been taking a low dose of Prozac for one month without noticeable improvement in her symptoms.  She underwent an endoscopy this week due to concerns that her gastric band may have slipped. She is scheduled for a chest CT to obtain another view of the area and is working towards a gastroenterology consultation.  She is managing well with her current Xanax  prescription.    Past Medical History:  Diagnosis Date   ADD (attention deficit disorder)    Anxiety    Depressed    Depression    Edema of both lower legs    Foot pain    GERD (gastroesophageal reflux disease)    Headache    History of condyloma acuminatum    Joint pain    Knee pain    Sleep apnea     Past  Surgical History:  Procedure Laterality Date   EXCISION BONE CYST Left 06/05/2019   Procedure: EXCISION BONE CYST;  Surgeon: Murrell Drivers, MD;  Location: [facility];  Service: Orthopedics;  Laterality: Left;   LAPAROSCOPIC GASTRIC BANDING  11/09/2005   Dr.Martin   MOLE REMOVAL     OPEN REDUCTION INTERNAL FIXATION (ORIF) FINGER WITH RADIAL BONE GRAFT Left 06/05/2019   Procedure: OPEN REDUCTION PIN FIXATION, CURRETAGE BONE LESION WITH CANCELLOUS CHIP BONE GRAFTING;  Surgeon: Murrell Drivers, MD;  Location: [facility];  Service: Orthopedics;  Laterality: Left;    Family History  Problem Relation Age of Onset   Diabetes Mother    Hypertension Mother    Hyperlipidemia Mother    Depression Mother    Anxiety disorder Mother    Bipolar disorder Mother    Alcoholism Mother    Heart attack Father 30   Heart disease Father 50       MI   High Cholesterol Father    Sudden death Father    Alcoholism Father    Multiple sclerosis Brother    Diabetes Maternal Grandmother    Leukemia Maternal Grandmother    Breast cancer Paternal Aunt    Cancer Other        breast    Social History   Socioeconomic History   Marital  status: Legally Separated    Spouse name: Medford   Number of children: 2   Years of education: Not on file   Highest education level: Not on file  Occupational History   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  Other Topics Concern   Not on file  Social History Narrative   Exercising --no   Social Drivers of Corporate Investment Banker Strain: Not on file  Food Insecurity: Not on file   Transportation Needs: Not on file  Physical Activity: Not on file  Stress: Not on file  Social Connections: Not on file  Intimate Partner Violence: Not on file    Outpatient Medications Prior to Visit  Medication Sig Dispense Refill   acetaminophen  (TYLENOL ) 500 MG tablet Take 1,000 mg by mouth every 8 (eight) hours as needed.     ALPRAZolam  (XANAX ) 0.25 MG tablet Take 1 tablet (0.25 mg total) by mouth 2 (two) times daily as needed for anxiety. 20 tablet 0   amphetamine -dextroamphetamine  (ADDERALL ) 30 MG tablet Take 1 tablet by mouth 2 (two) times daily. 60 tablet 0   folic acid  (FOLVITE ) 1 MG tablet Take 1 tablet (1 mg total) by mouth daily. 90 tablet 3   ibuprofen  (ADVIL ) 800 MG tablet Take 800 mg by mouth every 8 (eight) hours as needed.     levonorgestrel (MIRENA) 20 MCG/24HR IUD 1 each by Intrauterine route once.     pantoprazole (PROTONIX) 40 MG tablet Take 1 tablet (40 mg total) by mouth 2 (two) times daily. 90 tablet 3   TUBERCULIN SYR 1CC/27GX1/2 (B-D TB SYRINGE 1CC/27GX1/2) 27G X 1/2 1 ML MISC use as directed to inject B-12 50 each 0   Vitamin D , Ergocalciferol , (DRISDOL ) 1.25 MG (50000 UNIT) CAPS capsule Take 1 capsule (50,000 Units total) by mouth every 7 (seven) days. 12 capsule 1   FLUoxetine (PROZAC) 20 MG capsule Take 1 capsule (20 mg total) by mouth daily. 90 capsule 3   fluconazole  (DIFLUCAN ) 150 MG tablet Take 1 tablet (150 mg total) by mouth as a single dose. May repeat in 3 days if needed. (Patient not taking: Reported on 01/05/2024) 2 tablet 0   No facility-administered medications prior to visit.    Allergies  Allergen Reactions   Penicillins Rash    Amoxil  has been taken w/o reaction    Review of Systems  Constitutional:  Negative for fever and malaise/fatigue.  HENT:  Negative for congestion.   Eyes:  Negative for blurred vision.  Respiratory:  Negative for shortness of breath.   Cardiovascular:  Negative for chest pain, palpitations and leg swelling.   Gastrointestinal:  Negative for abdominal pain, blood in stool and nausea.  Genitourinary:  Negative for dysuria and frequency.  Musculoskeletal:  Negative for falls.  Skin:  Negative for rash.  Neurological:  Negative for dizziness, loss of consciousness and headaches.  Endo/Heme/Allergies:  Negative for environmental allergies.  Psychiatric/Behavioral:  Negative for depression. The patient is nervous/anxious.        Objective:    Physical Exam Vitals and nursing note reviewed.  Neurological:     General: No focal deficit present.     Mental Status: She is oriented to person, place, and time.  Psychiatric:        Attention and Perception: Attention and perception normal.        Mood and Affect: Mood is anxious. Mood is not depressed. Affect is not labile, flat or tearful.        Speech: Speech normal.        Behavior: Behavior normal.        Thought Content: Thought content normal.        Cognition and Memory: Cognition normal.        Judgment: Judgment normal.     There were no vitals taken for this visit. Wt Readings from Last 3 Encounters:  01/03/24 233 lb (105.7 kg)  12/06/23 233 lb 8 oz (105.9 kg)  12/05/23 236 lb 3.2 oz (107.1 kg)       Assessment & Plan:  Anxiety -     FLUoxetine HCl; Take 1 capsule (40 mg total) by mouth daily.  Dispense: 90 capsule; Refill: 3   Assessment and Plan Assessment & Plan Depression   No improvement on the current low dose of Prozac after one month. The dose is ineffective. Increase Prozac to 40 mg daily. Advise taking Prozac at a convenient time, considering potential insomnia.    I discussed the assessment and treatment plan with the patient. The patient was provided an opportunity to ask questions and all were answered. The patient agreed with the plan and demonstrated an understanding of the instructions.   The patient was advised to call back or seek an in-person evaluation if the symptoms worsen or if the condition fails to  improve as anticipated.  [name] [name], DO [facility] at [facility] [phone] (phone) [phone] (fax)  [facility]

## 2024-01-06 ENCOUNTER — Other Ambulatory Visit (HOSPITAL_COMMUNITY): Payer: Self-pay

## 2024-01-10 ENCOUNTER — Ambulatory Visit (HOSPITAL_COMMUNITY)
Admission: RE | Admit: 2024-01-10 | Discharge: 2024-01-10 | Disposition: A | Discharge status: Home / Self Care | Source: Ambulatory Visit | Attending: Gastroenterology | Admitting: Gastroenterology

## 2024-01-10 DIAGNOSIS — Z9884 Bariatric surgery status: Secondary | ICD-10-CM | POA: Diagnosis not present

## 2024-01-10 DIAGNOSIS — K219 Gastro-esophageal reflux disease without esophagitis: Secondary | ICD-10-CM | POA: Diagnosis not present

## 2024-01-10 DIAGNOSIS — R1319 Other dysphagia: Secondary | ICD-10-CM | POA: Diagnosis not present

## 2024-01-10 DIAGNOSIS — Z9889 Other specified postprocedural states: Secondary | ICD-10-CM | POA: Diagnosis not present

## 2024-01-10 DIAGNOSIS — R1110 Vomiting, unspecified: Secondary | ICD-10-CM | POA: Diagnosis not present

## 2024-01-10 LAB — SURGICAL PATHOLOGY

## 2024-01-11 ENCOUNTER — Other Ambulatory Visit (HOSPITAL_COMMUNITY)

## 2024-01-11 ENCOUNTER — Ambulatory Visit: Payer: Self-pay | Admitting: Gastroenterology

## 2024-01-12 ENCOUNTER — Ambulatory Visit: Payer: Self-pay | Admitting: Gastroenterology

## 2024-01-13 NOTE — Telephone Encounter (Signed)
Pt reviewed my chart message. °

## 2024-01-19 ENCOUNTER — Encounter: Payer: Self-pay | Admitting: Neurology

## 2024-01-19 ENCOUNTER — Ambulatory Visit: Admitting: Neurology

## 2024-01-19 VITALS — BP 152/103 | HR 83 | Ht 67.0 in | Wt 232.0 lb

## 2024-01-19 DIAGNOSIS — R0683 Snoring: Secondary | ICD-10-CM | POA: Diagnosis not present

## 2024-01-19 DIAGNOSIS — E661 Drug-induced obesity: Secondary | ICD-10-CM | POA: Diagnosis not present

## 2024-01-19 DIAGNOSIS — Z6836 Body mass index (BMI) 36.0-36.9, adult: Secondary | ICD-10-CM | POA: Diagnosis not present

## 2024-01-19 DIAGNOSIS — G478 Other sleep disorders: Secondary | ICD-10-CM

## 2024-01-19 DIAGNOSIS — E66812 Obesity, class 2: Secondary | ICD-10-CM | POA: Diagnosis not present

## 2024-01-19 NOTE — Progress Notes (Signed)
 @[facility]   Provider:  Dedra Gores, MD   Primary Care Physician:  [name] [name], DO [address] [place] [address]   Referring Provider: [name], [name], Do [address] [place],  [place] [address]        Chief Concern for this Consultation:   Patient presents with          HPI: I have the pleasure of meeting with Marisa Humphrey , on 01/19/24 , who is a 43 y.o.  female patient,  seen upon a referral by Dr Cruz   for a  Sleep Medicine Consultation.  The patient's referral information asked for an evaluation of excessive daytime sleepiness while on stimulants  for ADD/ ADHD.  Adderall  for about 10 years.    Chief concern according to patient:  I am always tired, like a benadryl  hang-over, I obversleep daily, hard to    She presented with a medical history of  has a past medical history of ADD (attention deficit disorder), Anxiety, Depressed, Depression, Edema of both lower legs, Foot pain, GERD (gastroesophageal reflux disease), Headache, History of condyloma acuminatum, Joint pain, Knee pain, and Sleep apnea.before lab band surgery- 2007. She is looking at a revision now.  She lost weight before she ever needed CPAP.  Lowest was 160 lbs, she has frequent vomiting. No DM.   Sleep relevant medical/ surgical and symptom history: The patient reports onset of symptoms over a time period of 10 years, since being pregnant in 2011 .    Exacerbated over the last 3 years since being a single mother. She snores. She has 2 daughters , one with ADD/ ADHD.  Vit D deficiency, TMJ, GERD, Mood disorders, Anxiety on Prozac, xanax  for panic attacks. .   The patient had one  previous sleep evaluation prior to bariatric surgery.    Family medical history: There are no  biological family members affected by Sleep apnea), or Insomnia () , by excessive daytime sleepiness). father passed at age 89 in his sleep, suspected of heart attack.      Social history: She is working as scientist, product/process development  for [employer]-   She lives in a private home, in a household with 2 daughters , age 86 and 96 .  One dog, gecko and beta.   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  The workplace involves physical activity, outdoor activity, travel.  Nicotine use: social , cigarettes .  ETOH use: social one a week.  Caffeine intake in form of: Coffee (3 a day ),Exercises regularly in form of walking .  Volunteering, ( school PA activity- engagements).   Sleep habits and routines are as follows: The patient's dinner time is around 7 PM.  Evening time is spent by housework. The patient goes to bed at, or close to, 10-11 PM. The bedroom is not shared  and is described as cool, quiet, and dark. The patient reports that it takes 10 minutes to fall asleep, then continues to sleep for 6-8 hours, uninterrupted or woken up by the need to void (Nocturia) once.  The preferred sleep position is laterally, with support of 2 pillows, (non- adjustable bed/ recliner ). The total estimated sleep time is circa 6-8 hours.  Dreams are reportedly frequent== Dream enactment has not been reported.   6 AM is the usual week- day rise time. The patient wakes  up with difficulties , multiple alarms needed.  an alarm set at 5.45 , 6 AM .  She  reports not feeling refreshed and restored in the morning, waking with symptoms such as dry mouth, no morning headaches,  and fatigue.    Isolated sleep paralysis has been experienced.  Naps in daytime are taken infrequently (there is a desire to nap and no opportunity), lasting from 15-30  and have no  refreshing quality. These do not interfere with nocturnal sleep.    Review of Systems: Out of a complete 14 system review, the patient complains of only the following symptoms, and all other reviewed systems are negative.:  Hypersomnia   anxiety:  Snoring,  Sleep  fragmentation from breath holding.    1-2 times Nocturia   How likely are you to doze in the following situations: 0 = not likely, 1 = slight chance, 2 = moderate chance, 3 = high chance Sitting and Reading? Watching Television? Sitting inactive in a public place (theater or meeting)? As a passenger in a car for an hour without a break? Lying down in the afternoon when circumstances permit? Sitting and talking to someone? Sitting quietly after lunch without alcohol? In a car, while stopped for a few minutes in traffic?   Total ESS =14 / 24 points.    FSS endorsed at 46/ 63 points.  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  Other Topics Concern   Not on file  Social History Narrative   Exercising --no   Social Drivers of Corporate Investment Banker Strain: Not on file  Food Insecurity: Not on file  Transportation Needs: Not on file  Physical Activity: Not on file  Stress: Not on file  Social Connections: Not on file    Family History  Problem Relation Age of Onset   Diabetes Mother    Hypertension Mother    Hyperlipidemia Mother    Depression Mother    Anxiety disorder Mother    Bipolar disorder Mother    Alcoholism Mother    Heart attack Father 31   Heart disease Father 2       MI   High Cholesterol Father    Sudden death Father    Alcoholism Father    Multiple sclerosis Brother     Diabetes Maternal Grandmother    Leukemia Maternal Grandmother    Breast cancer Paternal Aunt    Cancer Other        breast    Past Medical History:  Diagnosis Date   ADD (attention deficit disorder)    Anxiety    Depressed    Depression    Edema of both lower legs    Foot pain    GERD (gastroesophageal reflux disease)    Headache    History of  condyloma acuminatum    Joint pain    Knee pain    Sleep apnea     Past Surgical History:  Procedure Laterality Date   EXCISION BONE CYST Left 06/05/2019   Procedure: EXCISION BONE CYST;  Surgeon: Murrell Drivers, MD;  Location: [facility];  Service: Orthopedics;  Laterality: Left;   LAPAROSCOPIC GASTRIC BANDING  11/09/2005   Dr.Martin   MOLE REMOVAL     OPEN REDUCTION INTERNAL FIXATION (ORIF) FINGER WITH RADIAL BONE GRAFT Left 06/05/2019   Procedure: OPEN REDUCTION PIN FIXATION, CURRETAGE BONE LESION WITH CANCELLOUS CHIP BONE GRAFTING;  Surgeon: Murrell Drivers, MD;  Location: [facility];  Service: Orthopedics;  Laterality: Left;     Current Outpatient Medications on File Prior to Visit  Medication Sig Dispense Refill   acetaminophen  (TYLENOL ) 500 MG tablet Take 1,000 mg by mouth every 8 (eight) hours as needed.     ALPRAZolam  (XANAX ) 0.25 MG tablet Take 1 tablet (0.25 mg total) by mouth 2 (two) times daily as needed for anxiety. 20 tablet 0   amphetamine -dextroamphetamine  (ADDERALL ) 30 MG tablet Take 1 tablet by mouth 2 (two) times daily. 60 tablet 0   FLUoxetine (PROZAC) 40 MG capsule Take 1 capsule (40 mg total) by mouth daily. 90 capsule 3   folic acid  (FOLVITE ) 1 MG tablet Take 1 tablet (1 mg total) by mouth daily. 90 tablet 3   ibuprofen  (ADVIL ) 800 MG tablet Take 800 mg by mouth every 8 (eight) hours as needed.     levonorgestrel (MIRENA) 20 MCG/24HR IUD 1 each by Intrauterine route once.     pantoprazole (PROTONIX) 40 MG tablet Take 1 tablet (40 mg total) by mouth 2 (two) times daily. 90 tablet 3    TUBERCULIN SYR 1CC/27GX1/2 (B-D TB SYRINGE 1CC/27GX1/2) 27G X 1/2 1 ML MISC use as directed to inject B-12 50 each 0   Vitamin D , Ergocalciferol , (DRISDOL ) 1.25 MG (50000 UNIT) CAPS capsule Take 1 capsule (50,000 Units total) by mouth every 7 (seven) days. 12 capsule 1   No current facility-administered medications on file prior to visit.    Allergies  Allergen Reactions   Penicillins Rash    Amoxil  has been taken w/o reaction    Vitals:   01/19/24 1010  BP: (!) 152/103  Pulse: 83       Physical exam:   General: The patient was alert and appears not in acute distress.  Mood and affect are appropriate .  The patient's interactions are: Cooperative, makes eye contact, follows the instructions and answers questions coherently.  The patient is groomed and appropriately groomed and dressed. Head: Normocephalic, atraumatic.  Neck is supple. Mallampati: 1-2.  Large tonsils  The neck circumference measured 16 inches. Nasal airflow was patent ,   Underbite  was noted.  Dental status: biological  Cardiovascular:  Regular rate and cardiac rhythm by palpable pulse. Respiratory: no audible wheezing, no tachypnoea.   Skin:  Without evidence of ankle edema. No discoloration.  Trunk:  BMI is  36.34  The patient's posture was erect.   Neurologic exam : The patient was awake and alert, oriented to place and time.   Attention span & concentration ability appeared normal.  Speech was fluent, without dysarthria, dysphonia or aphasia, and of normal volume.     Cranial nerves:  There was no loss of smell or taste reported  Pupils are round, equal in size and briskly reactive to light.  Funduscopic exam was deferred.  Extraocular movements in  vertical and horizontal planes were intact and without nystagmus. (No Diplopia reported). Visual fields by finger perimetry are intact. Hearing was intact to soft voice.    Facial sensation intact to fine touch.  Facial motor strength: Symmetric  movement and tongue and uvula move midline.  Neck ROM: rotation, tilt and flexion extension were intact for age and shoulder shrug was symmetrical.    Motor exam:  Symmetric bulk, strength and ROM.   Normal tone without cog- wheeling, and symmetric grip strength.   Sensory:   vibration ( tested by tuning fork)  was  intact.  Proprioception tested in the upper extremities was normal.   Coordination: The patient reported no problems with button closure and no changes to penmanship.   without evidence of ataxia, dysmetria or tremor.   Gait and station: Patient could rise unassisted from a seated position, without bracing.   Deep tendon reflexes: Upper extremities did show symmetric DTRs. Lower extremity DTRs were symmetric and brisk/ attenuated.     I would like to thank Antonio Meth, Jamee SAUNDERS, DO and [address] [place],  [place] [address] for allowing me to meet with Mrs justino- Liane     The patient reports  loud snoring and non refreshing , non restorative sleep .  Risk factors for OSA were present,  including : Body mass index is 36.34 kg/m., neck size and upper airway anatomy.    My Plan is to proceed with:  HST/ PSG  for apnea screening : and if Apnea is not found, consider PSG and MSLT.   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    I plan to follow up personally or through our NP within 4-6 months.   A total time of  45  minutes consistent of a part of face to face encounter , exam and interview,  and additional preparation time for chart review was spent .  At today's visit, we discussed treatment options, associated risk and benefits, and engage in counseling as needed including, but not limited to:  Sleep hygiene, Quality Sleep Habits, and Safety concerns for patients with daytime sleepiness who are warned to not operate machinery/ motor vehicles when drowsy. Risk factors for sleep apnea were identified:    Body mass index is  36.34 kg/m..  Oral anatomy, neck size.   Additionally, the following were reviewed: Past medical records, past medical and surgical history, family and social background, as well as relevant laboratory results, imaging findings, and medical notes, where applicable.  This note was generated by myself in part by using dictation software, and as a result, it may contain unintentional typos and errors.  Nevertheless, effort was made to accurately convey the pertinent aspects of the patient's visit.   Dedra Gores, MD.     [facility] and [facility] Board certified in Sleep Medicine by The [employer] of Sleep Medicine and Diplomate of the [employer] of Sleep [employer] (AASM) . Board certified In Neurology, Diplomat of the ABPN,  Fellow of the [employer] of Neurology.

## 2024-01-19 NOTE — Patient Instructions (Signed)
 Living With Sleep Apnea Sleep apnea is a condition that affects your breathing while you're sleeping. Your tongue or the tissue in your throat may block the flow of air while you sleep. You may have shallow breathing or stop breathing for short periods of time. The breaks in breathing interrupt the deep sleep that you need to feel rested. Even if you don't wake up from the gaps in breathing, you may feel tired during the day. People with sleep apnea may snore loudly. You may have a headache in the morning and feel anxious or depressed. How can sleep apnea affect me? Sleep apnea increases your chances of being very tired during the day. This is called daytime fatigue. Sleep apnea can also increase your risk of: Heart attack. Stroke. Obesity. Type 2 diabetes. Heart failure. Irregular heartbeat. High blood pressure. If you are very tired during the day, you may be more likely to: Not do well in school or at work. Fall asleep while driving. Have trouble paying attention. Develop depression or anxiety. Have problems having sex. This is called sexual dysfunction. What actions can I take to manage sleep apnea? Sleep apnea treatment  If you were given a device to open your airway while you sleep, use it only as told by your health care provider. You may be given: An oral appliance. This is a mouthpiece that shifts your lower jaw forward. A continuous positive airway pressure (CPAP) device. This blows air through a mask. A nasal expiratory positive airway pressure (EPAP) device. This has valves that you put into each nostril. A bi-level positive airway pressure (BIPAP) device. This blows air through a mask when you breathe in and breathe out. You may need surgery if other treatments don't work for you. Sleep habits Go to sleep and wake up at the same time every day. This helps set your internal clock for sleeping. If you stay up later than usual on weekends, try to get up in the morning within 2  hours of the time you usually wake up. Try to get at least 7-9 hours of sleep each night. Stop using a computer, tablet, and mobile phone a few hours before bedtime. Do not take long naps during the day. If you nap, limit it to 30 minutes. Have a relaxing bedtime routine. Reading or listening to music may relax you and help you sleep. Use your bedroom only for sleep. Keep your television and computer out of your bedroom. Keep your bedroom cool, dark, and quiet. Use a supportive mattress and pillows. Follow your provider's instructions for other changes to sleep habits. Nutrition Do not eat big meals in the evening. Do not have caffeine in the later part of the day. The effects of caffeine can last for more than 5 hours. Follow your provider's instructions for any changes to what you eat and drink. Lifestyle Do not drink alcohol before bedtime. Alcohol can cause you to fall asleep at first, but then it can cause you to wake up in the middle of the night and have trouble getting back to sleep. Do not smoke, vape, or use nicotine or tobacco. Medicines Take over-the-counter and prescription medicines only as told by your provider. Do not use over-the-counter sleep medicine. You may become dependent on this medicine, and it can make sleep apnea worse. Do not take medicines, such as sedatives and narcotics, unless told to by your provider. Activity Exercise on most days, but avoid exercising in the evening. Exercising near bedtime can interfere with sleeping.  If possible, spend time outside every day. Natural light helps with your internal clock. General information Lose weight if you need to. Stay at a healthy weight. If you are having surgery, make sure to tell your provider that you have sleep apnea. You may need to bring your device with you. Keep all follow-up visits. Your provider will want to check on your condition. Where to find more information National Heart, Lung, and Blood  Institute: [employer] This information is not intended to replace advice given to you by your health care provider. Make sure you discuss any questions you have with your health care provider. Document Revised: 06/09/2022 Document Reviewed: 06/09/2022 Elsevier Patient Education  2024 [employer].

## 2024-01-20 ENCOUNTER — Other Ambulatory Visit (HOSPITAL_COMMUNITY): Payer: Self-pay

## 2024-01-20 ENCOUNTER — Other Ambulatory Visit: Payer: Self-pay | Admitting: Family Medicine

## 2024-01-20 DIAGNOSIS — F419 Anxiety disorder, unspecified: Secondary | ICD-10-CM

## 2024-01-20 DIAGNOSIS — R41840 Attention and concentration deficit: Secondary | ICD-10-CM

## 2024-01-20 DIAGNOSIS — E559 Vitamin D deficiency, unspecified: Secondary | ICD-10-CM

## 2024-01-20 MED ORDER — AMPHETAMINE-DEXTROAMPHETAMINE 30 MG PO TABS
30.0000 mg | ORAL_TABLET | Freq: Two times a day (BID) | ORAL | 0 refills | Status: DC
  Filled 2024-01-20: qty 60, 30d supply, fill #0

## 2024-01-20 MED ORDER — VITAMIN D (ERGOCALCIFEROL) 1.25 MG (50000 UNIT) PO CAPS
50000.0000 [IU] | ORAL_CAPSULE | ORAL | 1 refills | Status: AC
  Filled 2024-01-20: qty 12, 84d supply, fill #0

## 2024-01-20 MED ORDER — ALPRAZOLAM 0.25 MG PO TABS
0.2500 mg | ORAL_TABLET | Freq: Two times a day (BID) | ORAL | 0 refills | Status: DC | PRN
  Filled 2024-01-20: qty 20, 10d supply, fill #0

## 2024-01-27 ENCOUNTER — Ambulatory Visit: Admitting: Neurology

## 2024-01-27 DIAGNOSIS — R0683 Snoring: Secondary | ICD-10-CM

## 2024-01-27 DIAGNOSIS — G4710 Hypersomnia, unspecified: Secondary | ICD-10-CM | POA: Diagnosis not present

## 2024-01-27 DIAGNOSIS — E66812 Obesity, class 2: Secondary | ICD-10-CM

## 2024-01-27 DIAGNOSIS — G478 Other sleep disorders: Secondary | ICD-10-CM

## 2024-02-21 ENCOUNTER — Other Ambulatory Visit: Payer: Self-pay | Admitting: Family

## 2024-02-21 DIAGNOSIS — R41840 Attention and concentration deficit: Secondary | ICD-10-CM

## 2024-02-21 DIAGNOSIS — F419 Anxiety disorder, unspecified: Secondary | ICD-10-CM

## 2024-02-21 NOTE — Telephone Encounter (Signed)
 Requesting: alprazolam  and Adderall   Contract: 06/24/23 UDS: 06/24/23 Last Visit:01/05/24 Next Visit: None Last Refill: see med list   Please Advise

## 2024-02-22 ENCOUNTER — Other Ambulatory Visit (HOSPITAL_COMMUNITY): Payer: Self-pay

## 2024-02-22 MED ORDER — AMPHETAMINE-DEXTROAMPHETAMINE 30 MG PO TABS
30.0000 mg | ORAL_TABLET | Freq: Two times a day (BID) | ORAL | 0 refills | Status: DC
  Filled 2024-02-22: qty 60, 30d supply, fill #0

## 2024-02-22 MED ORDER — ALPRAZOLAM 0.25 MG PO TABS
0.2500 mg | ORAL_TABLET | Freq: Two times a day (BID) | ORAL | 0 refills | Status: DC | PRN
  Filled 2024-02-22: qty 20, 10d supply, fill #0

## 2024-03-08 NOTE — Progress Notes (Unsigned)
 Marisa Humphrey

## 2024-03-13 ENCOUNTER — Ambulatory Visit: Payer: Self-pay | Admitting: Neurology

## 2024-03-13 DIAGNOSIS — G478 Other sleep disorders: Secondary | ICD-10-CM

## 2024-03-13 DIAGNOSIS — E66812 Obesity, class 2: Secondary | ICD-10-CM

## 2024-03-13 DIAGNOSIS — G4710 Hypersomnia, unspecified: Secondary | ICD-10-CM

## 2024-03-13 NOTE — Procedures (Signed)
 "      [facility]  Marisa Humphrey 44 year old female Jan 13, 1981   HOME SLEEP TEST REPORT ( by Elene  mail -out device )    Study Protocol:     The SANSA chest-worn sensor is an FDA cleared and DOT approved type 4 home sleep test device - it measures eight physiological channels,  including blood oxygen saturation (measured via PPG [photoplethysmography]), EKG-derived heart rate, respiratory effort, chest movement (measured via accelerometer), snoring, body position, and actigraphy.   STUDY DATE:   02-22-2024 Data received : 03-08-2024     ORDERING CLINICIAN:  Dedra Gores, MD  REFERRING CLINICIAN: Dr Antonio Arden, DO    CLINICAL INFORMATION/HISTORY: 11.20.2025/  I have the pleasure of meeting with Marisa Humphrey , on 01/19/24 , who is a 44 y.o.  female patient,  seen upon a referral by Dr Cruz   for a  Sleep Medicine Consultation.   Chief concern according to patient:  I am always tired, like a benadryl  hang-over, I obversleep daily, hard to rise in the morning. 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   The patient's referral information asked for an evaluation of excessive daytime sleepiness while on stimulants  for ADD/ ADHD.  On Adderall  therapy for about 10 years.  She presented with a medical history of  Anxiety, Depression, Edema of both lower legs, Foot pain, GERD (gastroesophageal reflux disease), Headache, History of condyloma, Joint pain, Knee pain. The patient had one  previous sleep evaluation prior to bariatric surgery. and Sleep apnea before bariatric  lab- band surgery-in 2007. She is looking at a revision now.  She lost weight before she ever needed CPAP. Lowest body weight was 160 lbs, she has developed frequent vomiting. No DM.    Sleep relevant medical/ surgical and symptom history: The patient reports onset of symptoms over a time period of 10 years, since being pregnant in 2011. Exacerbated  over the last 3 years since being a single mother. She snores. She has 2 daughters , one with ADD/ ADHD. Vit D deficiency, TMJ, GERD, Mood disorders, Anxiety on Prozac , xanax  for panic attacks.     Epworth sleepiness score:   14 / 24 points.    FSS endorsed at 46/ 63 points.  BMI:  36.3 kg/m Neck Circumference: 16  large tonsils,  Underbite.     Sleep Summary:  Start of Recording Time (hours, min):  02-22-2024 at 2;38 AM    Total Sleep Time (hours, min):   4 h 55 minutes Sleep efficiency %; 76 REM sleep: the HST captured 62 minutes                                      Respiratory Indices ( AHI )  by AASM criteria of scoring;  Calculated pAHI (per hour):  5.1/h  ( very mild )                                           Positional  respiratory activity  / snoring : supine sleep was associated with an AHI of 7.8/h , all other sleep positions were associated with AHIs < 5/h.  Oxygen Saturation during Sleep: Oxygen Saturation (%) Mean at 95.7% with an  O2  Saturation Range (%) between 84  and 99 %                                      O2 Saturation time (minutes) <89%:   0 minutes         Pulse Rate during Sleep :  Pulse Mean (bpm) 74 bpm  in regular  rhythm,  Pulse Range between 59  bpm and 108  bpm.,                 IMPRESSION:  This HST confirms the presence of obstructive sleep apnea, mild degree.  Only in supine sleep was there an AHI over 5/h that would qualify as sleep apnea.    RECOMMENDATION:  HST/ PSG  for apnea screening : only very mild  Apnea is found, and could be treated by avoiding supine sleep . In order to explain the hypersomnia, we need to follow with PSG and MSLT ( to differentiate  between narcolepsy or Idiopathic hypersomnia ) .     Any Patient endorsing a high level of sleepiness should be cautioned not to drive, work at heights, or operate dangerous machinery or heavy equipment when tired or sleepy.  Review of good sleep hygiene measures took place in the initial  consultation but should be revisited ( Your guide to better sleep  a publication by the NIH is a good source of information).   The referring provider will be notified of the test results.    I certify that I have reviewed the raw data recording prior to the issuance of this report in accordance with the standards of the American Academy of Sleep Medicine (AASM).    INTERPRETING PHYSICIAN: Dedra Gores, MD  [facility] Sleep Board certified by The [employer] of Sleep Medicine and Diplomate of the [employer] of Sleep Medicine. Board certified In Neurology through the ABPN, Fellow of the [employer] of Neu[employer].      "

## 2024-03-15 NOTE — Telephone Encounter (Signed)
 FYI

## 2024-03-29 ENCOUNTER — Other Ambulatory Visit: Payer: Self-pay | Admitting: Family

## 2024-03-29 DIAGNOSIS — F419 Anxiety disorder, unspecified: Secondary | ICD-10-CM

## 2024-03-29 DIAGNOSIS — R41840 Attention and concentration deficit: Secondary | ICD-10-CM

## 2024-03-30 ENCOUNTER — Other Ambulatory Visit: Payer: Self-pay

## 2024-03-30 ENCOUNTER — Other Ambulatory Visit (HOSPITAL_COMMUNITY): Payer: Self-pay

## 2024-03-30 MED ORDER — ALPRAZOLAM 0.25 MG PO TABS
0.2500 mg | ORAL_TABLET | Freq: Two times a day (BID) | ORAL | 0 refills | Status: AC | PRN
  Filled 2024-03-30: qty 20, 10d supply, fill #0

## 2024-03-30 MED ORDER — AMPHETAMINE-DEXTROAMPHETAMINE 30 MG PO TABS
30.0000 mg | ORAL_TABLET | Freq: Two times a day (BID) | ORAL | 0 refills | Status: AC
  Filled 2024-03-30: qty 60, 30d supply, fill #0

## 2024-03-30 NOTE — Telephone Encounter (Signed)
 Requesting: alprazolam  and Adderall   Contract:06/24/23 UDS:06/24/23 Last Visit: 12/05/23 Next Visit: None Last Refill: see med list  Please Advise

## 2024-06-04 ENCOUNTER — Ambulatory Visit: Admitting: Adult Health
